# Patient Record
Sex: Male | Born: 1955
Health system: Southern US, Community
[De-identification: ages and names within clinical notes are randomized; demographics above are authoritative.]

## PROBLEM LIST (undated history)

## (undated) DIAGNOSIS — E119 Type 2 diabetes mellitus without complications: Secondary | ICD-10-CM

## (undated) DIAGNOSIS — I509 Heart failure, unspecified: Secondary | ICD-10-CM

## (undated) DIAGNOSIS — T7840XA Allergy, unspecified, initial encounter: Secondary | ICD-10-CM

## (undated) DIAGNOSIS — E785 Hyperlipidemia, unspecified: Secondary | ICD-10-CM

## (undated) DIAGNOSIS — I10 Essential (primary) hypertension: Secondary | ICD-10-CM

## (undated) DIAGNOSIS — G4730 Sleep apnea, unspecified: Secondary | ICD-10-CM

## (undated) DIAGNOSIS — F32A Depression, unspecified: Secondary | ICD-10-CM

## (undated) DIAGNOSIS — I639 Cerebral infarction, unspecified: Secondary | ICD-10-CM

## (undated) DIAGNOSIS — F419 Anxiety disorder, unspecified: Secondary | ICD-10-CM

## (undated) DIAGNOSIS — K219 Gastro-esophageal reflux disease without esophagitis: Secondary | ICD-10-CM

## (undated) DIAGNOSIS — G588 Other specified mononeuropathies: Secondary | ICD-10-CM

## (undated) DIAGNOSIS — M549 Dorsalgia, unspecified: Secondary | ICD-10-CM

## (undated) DIAGNOSIS — I4891 Unspecified atrial fibrillation: Secondary | ICD-10-CM

## (undated) DIAGNOSIS — M1990 Unspecified osteoarthritis, unspecified site: Secondary | ICD-10-CM

## (undated) DIAGNOSIS — G44009 Cluster headache syndrome, unspecified, not intractable: Secondary | ICD-10-CM

## (undated) DIAGNOSIS — Z95810 Presence of automatic (implantable) cardiac defibrillator: Secondary | ICD-10-CM

## (undated) DIAGNOSIS — R0902 Hypoxemia: Secondary | ICD-10-CM

## (undated) HISTORY — DX: Hypoxemia: R09.02

## (undated) HISTORY — PX: CARDIAC DEFIBRILLATOR PLACEMENT: SHX171

## (undated) HISTORY — DX: Anxiety disorder, unspecified: F41.9

## (undated) HISTORY — PX: HYDROCELE EXCISION / REPAIR: SUR1145

## (undated) HISTORY — DX: Heart failure, unspecified: I50.9

## (undated) HISTORY — DX: Gastro-esophageal reflux disease without esophagitis: K21.9

## (undated) HISTORY — DX: Allergy, unspecified, initial encounter: T78.40XA

## (undated) HISTORY — DX: Unspecified atrial fibrillation: I48.91

## (undated) HISTORY — DX: Unspecified osteoarthritis, unspecified site: M19.90

## (undated) HISTORY — PX: KNEE SURGERY: SHX244

## (undated) HISTORY — DX: Sleep apnea, unspecified: G47.30

## (undated) HISTORY — DX: Depression, unspecified: F32.A

## (undated) HISTORY — DX: Cerebral infarction, unspecified: I63.9

## (undated) HISTORY — PX: ROTATOR CUFF REPAIR: SHX139

## (undated) HISTORY — DX: Hyperlipidemia, unspecified: E78.5

---

## 2000-01-19 ENCOUNTER — Emergency Department (HOSPITAL_COMMUNITY): Admission: EM | Admit: 2000-01-19 | Discharge: 2000-01-19 | Payer: Self-pay | Admitting: Emergency Medicine

## 2001-05-01 ENCOUNTER — Inpatient Hospital Stay (HOSPITAL_COMMUNITY): Admission: EM | Admit: 2001-05-01 | Discharge: 2001-05-02 | Payer: Self-pay | Admitting: Emergency Medicine

## 2001-05-01 ENCOUNTER — Encounter: Payer: Self-pay | Admitting: Emergency Medicine

## 2002-02-21 ENCOUNTER — Emergency Department (HOSPITAL_COMMUNITY): Admission: EM | Admit: 2002-02-21 | Discharge: 2002-02-22 | Payer: Self-pay | Admitting: Emergency Medicine

## 2004-11-17 ENCOUNTER — Emergency Department (HOSPITAL_COMMUNITY): Admission: EM | Admit: 2004-11-17 | Discharge: 2004-11-18 | Payer: Self-pay | Admitting: Emergency Medicine

## 2007-12-16 ENCOUNTER — Emergency Department (HOSPITAL_COMMUNITY): Admission: EM | Admit: 2007-12-16 | Discharge: 2007-12-16 | Payer: Self-pay | Admitting: Emergency Medicine

## 2009-02-21 ENCOUNTER — Emergency Department (HOSPITAL_COMMUNITY): Admission: EM | Admit: 2009-02-21 | Discharge: 2009-02-21 | Payer: Self-pay | Admitting: Emergency Medicine

## 2010-08-08 LAB — DIFFERENTIAL
Basophils Absolute: 0 10*3/uL (ref 0.0–0.1)
Basophils Relative: 1 % (ref 0–1)
Eosinophils Relative: 2 % (ref 0–5)
Lymphocytes Relative: 36 % (ref 12–46)
Monocytes Absolute: 0.3 10*3/uL (ref 0.1–1.0)
Monocytes Relative: 8 % (ref 3–12)
Neutro Abs: 2 10*3/uL (ref 1.7–7.7)

## 2010-08-08 LAB — URINALYSIS, ROUTINE W REFLEX MICROSCOPIC
Bilirubin Urine: NEGATIVE
Glucose, UA: NEGATIVE mg/dL
Ketones, ur: NEGATIVE mg/dL
Leukocytes, UA: NEGATIVE
Nitrite: NEGATIVE
Protein, ur: NEGATIVE mg/dL
Specific Gravity, Urine: 1.016 (ref 1.005–1.030)
Urobilinogen, UA: 0.2 mg/dL (ref 0.0–1.0)
pH: 6 (ref 5.0–8.0)

## 2010-08-08 LAB — CBC
HCT: 46.4 % (ref 39.0–52.0)
Hemoglobin: 15.8 g/dL (ref 13.0–17.0)
MCHC: 34.1 g/dL (ref 30.0–36.0)
MCV: 85.2 fL (ref 78.0–100.0)
Platelets: 190 10*3/uL (ref 150–400)
RBC: 5.45 MIL/uL (ref 4.22–5.81)
RDW: 14.2 % (ref 11.5–15.5)
WBC: 3.6 10*3/uL — ABNORMAL LOW (ref 4.0–10.5)

## 2010-08-08 LAB — COMPREHENSIVE METABOLIC PANEL
ALT: 32 U/L (ref 0–53)
AST: 24 U/L (ref 0–37)
Albumin: 3.8 g/dL (ref 3.5–5.2)
Alkaline Phosphatase: 55 U/L (ref 39–117)
BUN: 14 mg/dL (ref 6–23)
CO2: 25 mEq/L (ref 19–32)
Calcium: 9.4 mg/dL (ref 8.4–10.5)
Chloride: 107 mEq/L (ref 96–112)
Creatinine, Ser: 1.3 mg/dL (ref 0.4–1.5)
GFR calc Af Amer: >60 mL/min (ref >60)
GFR calc non Af Amer: 58 mL/min — ABNORMAL LOW (ref >60)
Glucose, Bld: 128 mg/dL — ABNORMAL HIGH (ref 70–99)
Potassium: 4 mEq/L (ref 3.5–5.1)
Sodium: 139 mEq/L (ref 135–145)
Total Bilirubin: 0.2 mg/dL — ABNORMAL LOW (ref 0.3–1.2)
Total Protein: 6.9 g/dL (ref 6.0–8.3)

## 2010-08-08 LAB — URINE MICROSCOPIC-ADD ON

## 2010-08-08 LAB — APTT: aPTT: 26 seconds (ref 24–37)

## 2010-09-20 NOTE — Discharge Summary (Signed)
[place]. [facility]  Patient:    Kristopher Scott, Kristopher Scott Visit Number: [id number] MRN: [id number]          Service Type: MED Location: [id number] Attending Physician:  Darlin Priestly Dictated by:   Halford Decamp Delanna Ahmadi, R.N., N.P. Admit Date:  05/01/2001 Disc. Date: 05/02/01                             Discharge Summary  HISTORY OF PRESENT ILLNESS:  Mr. Kristopher Scott is a 55 year old African-American male patient with a history of borderline hypertension, probably due to the family history of hypertension and stroke.  He came to the emergency room after an episode of substernal chest pain occurring about 7 a.m.  It was described as severe with some discomfort also on his left shoulder.  It was midsternal located, and it was a pressure feeling.  He also had some nausea and diaphoresis.  It lasted about 30 to 45 minutes, resolved spontaneously, not specifically with nitroglycerin.  He also uses a lot of nonsteroidal anti-inflammatory drugs and BC Powders.  He was seen by Dr. Lenise Herald who decided that he should be ruled out for an myocardial infarction.  He also felt that this was somewhat atypical pain because the patient has apparently had this ongoing intermittently for 15 years, however, this mornings episode was somewhat more severe.  PHYSICAL EXAMINATION:  VITAL SIGNS:  Blood pressure 127/97, temperature 97.8, heart rate 94, respiratory rate 20.  LABORATORY DATA:  His EKG showed sinus bradycardia, rate of 57.  HOSPITAL COURSE:  On the morning of 05/02/01, he stated he had no further midsternal discomfort.  He did have an episode of right-sided chest discomfort, but no further midsternal pain.  His blood pressure was 132/85, pulse was 54, respiratory rate was 22, temperature was 97.4.  His CK-MBs were negative x 3.  First CK-MB was 354/2.9, #2 was 237/1.7, #3 was 182/1.6.  His troponins were 0.04, 0.03, and 0.03.  His chest x-ray did show  some cardiomegaly, some probably pulmonary vascular congestion, and parabronchial thickening, no interstitial edema.  He was seen by Dr. Elsie Lincoln who felt also that he could be discharged home.  His repeat EKG showed sinus bradycardia. It was decided that he should be sent home on aspirin, metoprolol, and Protonix.  At the time of discharge, the patient told me that he has had reactions to propranolol with weight loss and hallucinations.  It was felt that he should have an outpatient Cardiolite.  He will either have this done at the [place] system or in our office.  Also to note, he has a fasting lipid profile pending at the time of discharge.  His hemoglobin was 14.2, hematocrit 42.6, platelets 273.  White blood cell count 4.0.  His sodium was 138, potassium 3.6, BUN 12, creatinine 1.3, glucose 107.  DISCHARGE MEDICATIONS: 1. Toprol XL 25 mg q.d. 2. Enteric-coated aspirin 81 mg q.d. 3. Nexium 40 mg q.d.  ACTIVITY:  He will do no strenuous activity.  DIET:  Regular.  Do not add any salt to his foods.  FOLLOWUP:  Directions to our office were given.  He will call for sample medications of Toprol and Nexium in the morning.  Our office will call him with an appointment to have a treadmill Cardiolite.  [facility]  He may have his treadmill done with them.  DISCHARGE DIAGNOSES:  1. Chest pain, no myocardial infarction with negative CK-MBs. 2. Borderline hypertension. 3. Unknown lipid status with fasting lipid profile pending at the time of    discharge. 4. Frequent nonsteroidal anti-inflammatory drugs and BC Powder use with a    history of occasional reflux. Dictated by:   Halford Decamp Delanna Ahmadi, R.N., N.P. Attending Physician:  Darlin Priestly DD:  05/02/01 TD:  05/02/01 Job: [id number]

## 2011-01-31 LAB — DIFFERENTIAL
Eosinophils Relative: 3
Lymphs Abs: 1.6
Monocytes Absolute: 0.4
Monocytes Relative: 9
Neutro Abs: 2

## 2011-01-31 LAB — POCT I-STAT, CHEM 8
BUN: 10
Calcium, Ion: 1.22
Chloride: 103
Creatinine, Ser: 1.4
Glucose, Bld: 110 — ABNORMAL HIGH
TCO2: 29

## 2011-01-31 LAB — POCT CARDIAC MARKERS
CKMB, poc: <1 — ABNORMAL LOW
Myoglobin, poc: 72.2
Myoglobin, poc: 74.7
Troponin i, poc: <0.05

## 2011-01-31 LAB — CBC
HCT: 47.6
Hemoglobin: 16
MCV: 84.8
Platelets: 245
WBC: 4.1

## 2011-08-04 ENCOUNTER — Emergency Department (HOSPITAL_COMMUNITY): Payer: No Typology Code available for payment source

## 2011-08-04 ENCOUNTER — Encounter (HOSPITAL_COMMUNITY): Payer: Self-pay | Admitting: Emergency Medicine

## 2011-08-04 ENCOUNTER — Emergency Department (HOSPITAL_COMMUNITY)
Admission: EM | Admit: 2011-08-04 | Discharge: 2011-08-04 | Disposition: A | Discharge status: Home / Self Care | Payer: No Typology Code available for payment source | Attending: Emergency Medicine | Admitting: Emergency Medicine

## 2011-08-04 DIAGNOSIS — M545 Low back pain, unspecified: Secondary | ICD-10-CM | POA: Insufficient documentation

## 2011-08-04 DIAGNOSIS — Z79899 Other long term (current) drug therapy: Secondary | ICD-10-CM | POA: Insufficient documentation

## 2011-08-04 DIAGNOSIS — I10 Essential (primary) hypertension: Secondary | ICD-10-CM | POA: Insufficient documentation

## 2011-08-04 DIAGNOSIS — Y92410 Unspecified street and highway as the place of occurrence of the external cause: Secondary | ICD-10-CM | POA: Insufficient documentation

## 2011-08-04 DIAGNOSIS — M25519 Pain in unspecified shoulder: Secondary | ICD-10-CM | POA: Insufficient documentation

## 2011-08-04 DIAGNOSIS — M25579 Pain in unspecified ankle and joints of unspecified foot: Secondary | ICD-10-CM | POA: Insufficient documentation

## 2011-08-04 DIAGNOSIS — M542 Cervicalgia: Secondary | ICD-10-CM | POA: Insufficient documentation

## 2011-08-04 DIAGNOSIS — T148XXA Other injury of unspecified body region, initial encounter: Secondary | ICD-10-CM

## 2011-08-04 HISTORY — DX: Dorsalgia, unspecified: M54.9

## 2011-08-04 HISTORY — DX: Essential (primary) hypertension: I10

## 2011-08-04 MED ORDER — OXYCODONE-ACETAMINOPHEN 5-325 MG PO TABS
2.0000 | ORAL_TABLET | Freq: Once | ORAL | Status: AC
  Administered 2011-08-04: 2 via ORAL
  Filled 2011-08-04: qty 2
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
## 2011-10-28 ENCOUNTER — Emergency Department (HOSPITAL_COMMUNITY)

## 2011-10-28 ENCOUNTER — Encounter (HOSPITAL_COMMUNITY): Payer: Self-pay | Admitting: *Deleted

## 2011-10-28 ENCOUNTER — Emergency Department (HOSPITAL_COMMUNITY)
Admission: EM | Admit: 2011-10-28 | Discharge: 2011-10-28 | Disposition: A | Discharge status: Home / Self Care | Payer: No Typology Code available for payment source | Attending: Emergency Medicine | Admitting: Emergency Medicine

## 2011-10-28 DIAGNOSIS — R110 Nausea: Secondary | ICD-10-CM | POA: Insufficient documentation

## 2011-10-28 DIAGNOSIS — R51 Headache: Secondary | ICD-10-CM | POA: Insufficient documentation

## 2011-10-28 DIAGNOSIS — H53149 Visual discomfort, unspecified: Secondary | ICD-10-CM | POA: Insufficient documentation

## 2011-10-28 DIAGNOSIS — I10 Essential (primary) hypertension: Secondary | ICD-10-CM | POA: Insufficient documentation

## 2011-10-28 LAB — GLUCOSE, CAPILLARY: Glucose-Capillary: 109 mg/dL — ABNORMAL HIGH (ref 70–99)

## 2011-10-28 MED ORDER — HYDROMORPHONE HCL PF 1 MG/ML IJ SOLN
1.0000 mg | Freq: Once | INTRAMUSCULAR | Status: AC
  Administered 2011-10-28: 1 mg via INTRAVENOUS
  Filled 2011-10-28: qty 1

## 2011-10-28 MED ORDER — METOCLOPRAMIDE HCL 5 MG/ML IJ SOLN
10.0000 mg | Freq: Once | INTRAMUSCULAR | Status: AC
  Administered 2011-10-28: 10 mg via INTRAVENOUS
  Filled 2011-10-28: qty 2

## 2011-10-28 MED ORDER — METOCLOPRAMIDE HCL 5 MG/ML IJ SOLN: 10.0000 mg | Freq: Once | INTRAMUSCULAR | Status: DC

## 2011-10-28 MED ORDER — SODIUM CHLORIDE 0.9 % IV SOLN
Freq: Once | INTRAVENOUS | Status: AC
  Administered 2011-10-28: 20:00:00 via INTRAVENOUS

## 2011-10-28 NOTE — Discharge Instructions (Signed)
Your symptoms today are suggestive of a cluster headache. You have your head CT scan tonight was normal. Call the [facility] clinic for followup. If headaches continue, ask to see a headache specialist

## 2011-10-28 NOTE — ED Notes (Signed)
Pt brought in by private vehicle; diaphoretic/lethargic; assisted to bed; pt states had severe sharp pain to head this morning radiating to jaw that went away; states tonight about an hour prior to arrival had same severe head pain at right temple making him want to cry; felt weak and short of breath;

## 2011-10-28 NOTE — ED Provider Notes (Signed)
History     CSN: [id number]  Arrival date & time 10/28/11  2006   First MD Initiated Contact with Patient 10/28/11 2012      No chief complaint on file.   (Consider location/radiation/quality/duration/timing/severity/associated sxs/prior treatment) HPI Complains of right-sided temporal headache radiating to right jaw throbbing in nature gradual in onset approximately 90 minutes ago. Patient had similar headache this morning which lasted 2 hours resolved after treatment with BC powder. Accompanying symptoms include nausea and photophobia no other complaint no chest pain no shortness of breath no other complaint no weakness. Pain is constant not made better or worse by anything. Patient reports he gets headaches almost daily, however not as severe as this. Patient denies shortness of breath Past Medical History  Diagnosis Date  . Hypertension   . Back pain     No past surgical history on file.  No family history on file.  History  Substance Use Topics  . Smoking status: Not on file  . Smokeless tobacco: Not on file  . Alcohol Use:       Review of Systems  Constitutional: Negative.   Eyes: Positive for photophobia.  Respiratory: Negative.   Cardiovascular: Negative.   Gastrointestinal: Positive for nausea.  Musculoskeletal: Negative.   Skin: Negative.   Neurological: Positive for headaches.  Hematological: Negative.   Psychiatric/Behavioral: Negative.     Allergies  Propranolol  Home Medications   Current Outpatient Rx  Name Route Sig Dispense Refill  . ALLOPURINOL 100 MG PO TABS Oral Take 100 mg by mouth daily.    Marland Kitchen AMLODIPINE BESYLATE 10 MG PO TABS Oral Take 10 mg by mouth daily.    . CYCLOBENZAPRINE HCL 10 MG PO TABS Oral Take 10 mg by mouth 3 (three) times daily as needed.    Marland Kitchen DICLOFENAC SODIUM 75 MG PO TBEC Oral Take 75 mg by mouth 2 (two) times daily.    Marland Kitchen DICYCLOMINE HCL 10 MG PO CAPS Oral Take 10 mg by mouth 4 (four) times daily -  before meals and at  bedtime.    . OXYCODONE HCL 5 MG PO CAPS Oral Take 5-10 mg by mouth every 4 (four) hours as needed. pain    . SERTRALINE HCL 100 MG PO TABS Oral Take 100 mg by mouth daily.    . TRAZODONE HCL 100 MG PO TABS Oral Take 100 mg by mouth at bedtime.      There were no vitals taken for this visit.  Physical Exam  Nursing note and vitals reviewed. Constitutional: He appears well-developed and well-nourished. He appears distressed.       Appears uncomfortable Glasgow Coma Score 15  HENT:  Head: Normocephalic and atraumatic.  Eyes: Conjunctivae are normal. Pupils are equal, round, and reactive to light.       Photophobia  Neck: Neck supple. No tracheal deviation present. No thyromegaly present.  Cardiovascular: Normal rate and regular rhythm.   No murmur heard. Pulmonary/Chest: Effort normal and breath sounds normal.  Abdominal: Soft. Bowel sounds are normal. He exhibits no distension. There is no tenderness.  Musculoskeletal: Normal range of motion. He exhibits no edema and no tenderness.  Neurological: He is alert. No cranial nerve deficit. Coordination normal.  Skin: Skin is warm and dry. No rash noted.  Psychiatric: He has a normal mood and affect.    ED Course  Procedures (including critical care time) 9:20 PM pain significantly improved after treatment with intravenous Reglan and 100% oxygen patient alert Glasgow Coma Score 15 1045  feels much improved after treatment with intravenous narcotic patient alert ambulates without difficulty not lightheaded on standing and feels ready to go home Labs Reviewed - No data to display No results found.   No diagnosis found.  Date: 10/28/2011  Rate: 75  Rhythm: normal sinus rhythm  QRS Axis: left  Intervals: normal  ST/T Wave abnormalities: normal  Conduction Disutrbances:none  Narrative Interpretation:   Old EKG Reviewed: unchanged Unchanged from 02/21/2009, interpreted by me  Results for orders placed during the hospital encounter of  10/28/11  GLUCOSE, CAPILLARY      Component Value Range   Glucose-Capillary 109 (*) 70 - 99 mg/dL   Ct Head Wo Contrast  10/28/2011  *RADIOLOGY REPORT*  Clinical Data: 56 year old male with headache.  CT HEAD WITHOUT CONTRAST  Technique:  Contiguous axial images were obtained from the base of the skull through the vertex without contrast.  Comparison: 12/16/2007.  Findings: Negative paranasal sinuses, unchanged small right maxillary mucous retention cyst. Visualized orbit soft tissues are within normal limits.  Visualized scalp soft tissues are within normal limits.  No acute osseous abnormality identified.  Cerebral volume is within normal limits for age.  No midline shift, ventriculomegaly, mass effect, evidence of mass lesion, intracranial hemorrhage or evidence of cortically based acute infarction.  Gray-white matter differentiation is within normal limits throughout the brain.  No suspicious intracranial vascular hyperdensity.  IMPRESSION: Stable and normal noncontrast CT appearance of the brain.  Original Report Authenticated By: Harley Hallmark, M.D.     MDM  Constellation of Symptoms are suggestive of cluster headache . srongly doubt sah given pt has h/o throbbing h/a , gradual onset [facility]        Doug Sou, MD 10/28/11 2259

## 2012-01-23 DIAGNOSIS — M75100 Unspecified rotator cuff tear or rupture of unspecified shoulder, not specified as traumatic: Secondary | ICD-10-CM | POA: Insufficient documentation

## 2012-04-04 HISTORY — PX: ROTATOR CUFF REPAIR: SHX139

## 2013-01-25 ENCOUNTER — Emergency Department (HOSPITAL_COMMUNITY): Payer: Non-veteran care

## 2013-01-25 ENCOUNTER — Emergency Department (HOSPITAL_COMMUNITY)
Admission: EM | Admit: 2013-01-25 | Discharge: 2013-01-26 | Disposition: A | Discharge status: Home / Self Care | Payer: Non-veteran care | Attending: Emergency Medicine | Admitting: Emergency Medicine

## 2013-01-25 ENCOUNTER — Encounter (HOSPITAL_COMMUNITY): Payer: Self-pay | Admitting: Emergency Medicine

## 2013-01-25 DIAGNOSIS — G43909 Migraine, unspecified, not intractable, without status migrainosus: Secondary | ICD-10-CM | POA: Insufficient documentation

## 2013-01-25 DIAGNOSIS — M542 Cervicalgia: Secondary | ICD-10-CM | POA: Insufficient documentation

## 2013-01-25 DIAGNOSIS — I10 Essential (primary) hypertension: Secondary | ICD-10-CM | POA: Insufficient documentation

## 2013-01-25 DIAGNOSIS — H53149 Visual discomfort, unspecified: Secondary | ICD-10-CM | POA: Insufficient documentation

## 2013-01-25 DIAGNOSIS — Z7982 Long term (current) use of aspirin: Secondary | ICD-10-CM | POA: Insufficient documentation

## 2013-01-25 DIAGNOSIS — G8929 Other chronic pain: Secondary | ICD-10-CM | POA: Insufficient documentation

## 2013-01-25 DIAGNOSIS — R110 Nausea: Secondary | ICD-10-CM | POA: Insufficient documentation

## 2013-01-25 DIAGNOSIS — Z79899 Other long term (current) drug therapy: Secondary | ICD-10-CM | POA: Insufficient documentation

## 2013-01-25 HISTORY — DX: Cluster headache syndrome, unspecified, not intractable: G44.009

## 2013-01-25 MED ORDER — ONDANSETRON 8 MG PO TBDP
8.0000 mg | ORAL_TABLET | Freq: Once | ORAL | Status: AC
  Administered 2013-01-25: 8 mg via ORAL
  Filled 2013-01-25: qty 1

## 2013-01-25 MED ORDER — METOCLOPRAMIDE HCL 5 MG/ML IJ SOLN
10.0000 mg | Freq: Once | INTRAMUSCULAR | Status: AC
  Administered 2013-01-25: 10 mg via INTRAMUSCULAR
  Filled 2013-01-25: qty 2

## 2013-01-25 MED ORDER — DIPHENHYDRAMINE HCL 50 MG/ML IJ SOLN
25.0000 mg | Freq: Once | INTRAMUSCULAR | Status: AC
  Administered 2013-01-25: 25 mg via INTRAMUSCULAR
  Filled 2013-01-25: qty 1

## 2013-01-25 MED ORDER — SUMATRIPTAN SUCCINATE 6 MG/0.5ML ~~LOC~~ SOLN
6.0000 mg | Freq: Once | SUBCUTANEOUS | Status: AC
  Administered 2013-01-25: 6 mg via SUBCUTANEOUS
  Filled 2013-01-25: qty 0.5

## 2013-01-25 NOTE — ED Notes (Addendum)
Pt reports a HA since 1230 with R sided neck pain n/v, ShOB, weakness, lightheaded, sensitivity to light.  Pt reports cluster HAs in the past, but this is worse than ever before with associated s/s.

## 2013-01-25 NOTE — ED Provider Notes (Signed)
CSN: [id number]     Arrival date & time 01/25/13  1857 History   First MD Initiated Contact with Patient 01/25/13 2047     Chief Complaint  Patient presents with  . Headache  . Neck Pain   (Consider location/radiation/quality/duration/timing/severity/associated sxs/prior Treatment) HPI  Kristopher Scott is a 57 y.o. male  with a hx of HTN, cluster headache, chronic back pain presents to the Emergency Department complaining of gradual, persistent, progressively worsening headache onset yesterday evening.  Patient reports that yesterday evening he lives, throbbing headache that resolved with Goody headache powder. He reports the headache returned today at 2 PM and he felt drowsy with it. He states he laid down and slept until 5 PM when he awoke and felt as if his "head exploded."  He reports he had an immediate and sharp increase in his pain beginning in the frontal region that radiated around the back of his head and down into his neck.  Patient reports right-sided neck pain but no midline or left-sided neck pain. He denies back pain, fever, syncope or visual disturbances.  Associated symptoms include photophobia, and nausea without vomiting.  Patient attempted to take Goody's headache powder again this evening without relief. Nothing makes it better and light and sound makes it worse.  Pt denies fever, chills, chest pain, shortness of breath, abdominal pain, vomiting, diarrhea, weakness, dizziness, syncope, dysuria, hematuria.  Patient reports that last year he had a very similar headache and was diagnosed with a "cluster headache" but he's not had any since that time. He denies lacrimation, rhinorrhea, facial numbness or diaphoresis.   Past Medical History  Diagnosis Date  . Hypertension   . Back pain   . Cluster headaches    Past Surgical History  Procedure Laterality Date  . Rotator cuff repair Right 04/2012   History reviewed. No pertinent family history. History  Substance Use Topics   . Smoking status: Never Smoker   . Smokeless tobacco: Never Used  . Alcohol Use: Yes     Comment: socially    Review of Systems  Constitutional: Negative for fever, diaphoresis, appetite change, fatigue and unexpected weight change.  HENT: Positive for neck pain. Negative for mouth sores and neck stiffness.   Eyes: Positive for photophobia. Negative for visual disturbance.  Respiratory: Negative for cough, chest tightness, shortness of breath and wheezing.   Cardiovascular: Negative for chest pain.  Gastrointestinal: Negative for nausea, vomiting, abdominal pain, diarrhea and constipation.  Endocrine: Negative for polydipsia, polyphagia and polyuria.  Genitourinary: Negative for dysuria, urgency, frequency and hematuria.  Musculoskeletal: Negative for back pain.  Skin: Negative for rash.  Allergic/Immunologic: Negative for immunocompromised state.  Neurological: Positive for headaches. Negative for syncope and light-headedness.  Hematological: Does not bruise/bleed easily.  Psychiatric/Behavioral: Negative for sleep disturbance. The patient is not nervous/anxious.     Allergies  Propranolol  Home Medications   Current Outpatient Rx  Name  Route  Sig  Dispense  Refill  . allopurinol (ZYLOPRIM) 100 MG tablet   Oral   Take 100 mg by mouth daily.         Marland Kitchen amLODipine (NORVASC) 10 MG tablet   Oral   Take 10 mg by mouth daily.         . Aspirin-Acetaminophen-Caffeine (GOODY HEADACHE PO)   Oral   Take 1 packet by mouth daily as needed (for pain).         . cyclobenzaprine (FLEXERIL) 10 MG tablet   Oral   Take 10  mg by mouth 3 (three) times daily as needed for muscle spasms.          . diclofenac (VOLTAREN) 75 MG EC tablet   Oral   Take 75 mg by mouth 2 (two) times daily.         Marland Kitchen oxyCODONE (OXY IR/ROXICODONE) 5 MG immediate release tablet   Oral   Take 10 mg by mouth every 4 (four) hours as needed for pain.         Marland Kitchen sertraline (ZOLOFT) 100 MG tablet    Oral   Take 100 mg by mouth daily.         . traZODone (DESYREL) 100 MG tablet   Oral   Take 50 mg by mouth at bedtime.           BP 148/94  Pulse 84  Temp(Src) 99 F (37.2 C) (Oral)  Resp 18  Ht 6\' 1"  (1.854 m)  Wt 250 lb (113.399 kg)  BMI 32.99 kg/m2  SpO2 97% Physical Exam  Nursing note and vitals reviewed. Constitutional: He is oriented to person, place, and time. He appears well-developed and well-nourished. No distress.  HENT:  Head: Normocephalic and atraumatic.  Right Ear: Hearing, tympanic membrane, external ear and ear canal normal.  Left Ear: Hearing, tympanic membrane, external ear and ear canal normal.  Nose: Nose normal. No mucosal edema or rhinorrhea.  Mouth/Throat: Uvula is midline, oropharynx is clear and moist and mucous membranes are normal. Mucous membranes are not dry. No edematous. No oropharyngeal exudate, posterior oropharyngeal edema, posterior oropharyngeal erythema or tonsillar abscesses.  No tenderness palpation of the temporal artery  Eyes: Conjunctivae and EOM are normal. Pupils are equal, round, and reactive to light. No scleral icterus.  Neck: Normal range of motion and full passive range of motion without pain. Neck supple. No spinous process tenderness and no muscular tenderness present. No rigidity. Normal range of motion present. No Brudzinski's sign and no Kernig's sign noted.  Full range of motion without pain No nuchal rigidity or stiffness  Cardiovascular: Normal rate, regular rhythm, normal heart sounds and intact distal pulses.   No murmur heard. Pulmonary/Chest: Effort normal and breath sounds normal. No respiratory distress. He has no wheezes. He has no rales.  Abdominal: Soft. Bowel sounds are normal. He exhibits no distension. There is no tenderness. There is no rebound and no guarding.  Musculoskeletal: Normal range of motion.  Lymphadenopathy:    He has no cervical adenopathy.  Neurological: He is alert and oriented to person,  place, and time. He has normal reflexes. No cranial nerve deficit. He exhibits normal muscle tone. Coordination normal.  Speech is clear and goal oriented, follows commands Cranial nerves III - XII without deficit, no facial droop Normal strength in upper and lower extremities bilaterally, strong and equal grip strength Sensation normal to light and sharp touch Moves extremities without ataxia, coordination intact Normal finger to nose and rapid alternating movements Neg romberg, no pronator drift Normal gait Normal heel-shin and balance   Skin: Skin is warm and dry. No rash noted. He is not diaphoretic.  Psychiatric: He has a normal mood and affect. His behavior is normal. Judgment and thought content normal.    ED Course  Procedures (including critical care time) Labs Review Labs Reviewed - No data to display Imaging Review Ct Head Wo Contrast  01/25/2013   CLINICAL DATA:  Headache.  EXAM: CT HEAD WITHOUT CONTRAST  TECHNIQUE: Contiguous axial images were obtained from the base of the  skull through the vertex without intravenous contrast.  COMPARISON:  Head CT scan C 10/28/2011.  FINDINGS: The brain appears normal without infarct, hemorrhage, mass lesion, mass effect, midline shift or abnormal extra-axial fluid collection. Empty sella is noted. Small mucous retention cyst or polyp is seen the right maxillary sinus. No pneumocephalus or hydrocephalus.  IMPRESSION: No acute finding.   Electronically Signed   By: Drusilla Kanner M.D.   On: 01/25/2013 22:03   ECG:  Date: 01/26/2013  Rate: 80  Rhythm: normal sinus rhythm  QRS Axis: left  Intervals: normal  ST/T Wave abnormalities: normal  Conduction Disutrbances:none  Narrative Interpretation:   Old EKG Reviewed: unchanged    MDM   1. Migraine headache      DENIS CARREON presents with headache, similar to his last headache that with increased intensity.  Patient history concerning for possibility of subarachnoid therefore  will CT scan.  Patient is afebrile, non-tachycardic with no neurologic deficits on exam. Nursing note review and reports shortness of breath, weakness along, lightheadedness as well as nausea and vomiting, however the patient denies all but nausea.  CT without acute abnormality specifically no infarct hemorrhage or mass lesion.  Will give sumatriptan, Reglan and Benadryl.  12:07 AM Patient with complete resolution of headache, photophobia and nausea. He states he feels back to normal and like to be discharged.   Pt HA treated and improved while in ED.  Presentation is non-concerning for Meningitis, or temporal arteritis. Pt is afebrile with no focal neuro deficits, nuchal rigidity, or change in vision. Pt is to follow up with PCP to discuss prophylactic medication. Pt verbalizes understanding and is agreeable with plan to dc.    It has been determined that no acute conditions requiring further emergency intervention are present at this time. The patient/guardian have been advised of the diagnosis and plan. We have discussed signs and symptoms that warrant return to the ED, such as changes or worsening in symptoms.   Vital signs are stable at discharge.   BP 148/94  Pulse 84  Temp(Src) 99 F (37.2 C) (Oral)  Resp 18  Ht 6\' 1"  (1.854 m)  Wt 250 lb (113.399 kg)  BMI 32.99 kg/m2  SpO2 97%  Patient/guardian has voiced understanding and agreed to follow-up with the PCP or specialist.         MDM Number of Diagnoses or Management Options Migraine headache:       Dierdre Forth, PA-C 01/26/13 0009

## 2013-01-26 NOTE — ED Provider Notes (Signed)
Medical screening examination/treatment/procedure(s) were performed by non-physician practitioner and as supervising physician I was immediately available for consultation/collaboration.  Ladell Bey F Toshio Slusher, MD 01/26/13 1418 

## 2014-05-05 ENCOUNTER — Emergency Department (HOSPITAL_COMMUNITY)
Admission: EM | Admit: 2014-05-05 | Discharge: 2014-05-05 | Disposition: A | Discharge status: Home / Self Care | Payer: Non-veteran care | Attending: Emergency Medicine | Admitting: Emergency Medicine

## 2014-05-05 ENCOUNTER — Encounter (HOSPITAL_COMMUNITY): Payer: Self-pay | Admitting: Emergency Medicine

## 2014-05-05 ENCOUNTER — Emergency Department (HOSPITAL_COMMUNITY): Payer: Non-veteran care

## 2014-05-05 DIAGNOSIS — R05 Cough: Secondary | ICD-10-CM

## 2014-05-05 DIAGNOSIS — I10 Essential (primary) hypertension: Secondary | ICD-10-CM | POA: Insufficient documentation

## 2014-05-05 DIAGNOSIS — R000 Tachycardia, unspecified: Secondary | ICD-10-CM | POA: Diagnosis not present

## 2014-05-05 DIAGNOSIS — R2243 Localized swelling, mass and lump, lower limb, bilateral: Secondary | ICD-10-CM | POA: Insufficient documentation

## 2014-05-05 DIAGNOSIS — R059 Cough, unspecified: Secondary | ICD-10-CM

## 2014-05-05 DIAGNOSIS — I509 Heart failure, unspecified: Secondary | ICD-10-CM | POA: Diagnosis not present

## 2014-05-05 DIAGNOSIS — Z7982 Long term (current) use of aspirin: Secondary | ICD-10-CM | POA: Insufficient documentation

## 2014-05-05 DIAGNOSIS — R079 Chest pain, unspecified: Secondary | ICD-10-CM | POA: Diagnosis present

## 2014-05-05 DIAGNOSIS — Z79899 Other long term (current) drug therapy: Secondary | ICD-10-CM | POA: Diagnosis not present

## 2014-05-05 DIAGNOSIS — Z8669 Personal history of other diseases of the nervous system and sense organs: Secondary | ICD-10-CM | POA: Insufficient documentation

## 2014-05-05 LAB — CBC WITH DIFFERENTIAL/PLATELET
Basophils Absolute: 0 10*3/uL (ref 0.0–0.1)
Basophils Relative: 0 % (ref 0–1)
Eosinophils Absolute: 0.1 10*3/uL (ref 0.0–0.7)
Eosinophils Relative: 2 % (ref 0–5)
HCT: 42.8 % (ref 39.0–52.0)
Hemoglobin: 14 g/dL (ref 13.0–17.0)
Lymphocytes Relative: 37 % (ref 12–46)
Lymphs Abs: 1.7 10*3/uL (ref 0.7–4.0)
MCH: 28.5 pg (ref 26.0–34.0)
MCHC: 32.7 g/dL (ref 30.0–36.0)
MCV: 87 fL (ref 78.0–100.0)
Monocytes Absolute: 0.2 10*3/uL (ref 0.1–1.0)
Monocytes Relative: 4 % (ref 3–12)
Neutro Abs: 2.6 10*3/uL (ref 1.7–7.7)
Neutrophils Relative %: 57 % (ref 43–77)
Platelets: 202 10*3/uL (ref 150–400)
RBC: 4.92 MIL/uL (ref 4.22–5.81)
RDW: 14.9 % (ref 11.5–15.5)
WBC: 4.6 10*3/uL (ref 4.0–10.5)

## 2014-05-05 LAB — BASIC METABOLIC PANEL
Anion gap: 8 (ref 5–15)
BUN: 10 mg/dL (ref 6–23)
CO2: 28 mmol/L (ref 19–32)
Calcium: 8.8 mg/dL (ref 8.4–10.5)
Chloride: 104 mEq/L (ref 96–112)
Creatinine, Ser: 1.29 mg/dL (ref 0.50–1.35)
GFR calc Af Amer: 70 mL/min — ABNORMAL LOW (ref >90)
GFR calc non Af Amer: 60 mL/min — ABNORMAL LOW (ref >90)
Glucose, Bld: 113 mg/dL — ABNORMAL HIGH (ref 70–99)
Potassium: 3.5 mmol/L (ref 3.5–5.1)
Sodium: 140 mmol/L (ref 135–145)

## 2014-05-05 LAB — BRAIN NATRIURETIC PEPTIDE: B Natriuretic Peptide: 517.6 pg/mL — ABNORMAL HIGH (ref 0.0–100.0)

## 2014-05-05 LAB — I-STAT TROPONIN, ED: Troponin i, poc: 0.03 ng/mL (ref 0.00–0.08)

## 2014-05-05 MED ORDER — HYDROMORPHONE HCL 1 MG/ML IJ SOLN
1.0000 mg | Freq: Once | INTRAMUSCULAR | Status: AC
  Administered 2014-05-05: 1 mg via INTRAVENOUS
  Filled 2014-05-05: qty 1

## 2014-05-05 MED ORDER — FUROSEMIDE 10 MG/ML IJ SOLN
40.0000 mg | INTRAMUSCULAR | Status: AC
  Administered 2014-05-05: 40 mg via INTRAVENOUS
  Filled 2014-05-05: qty 4

## 2014-05-05 MED ORDER — FUROSEMIDE 40 MG PO TABS: 40.0000 mg | ORAL_TABLET | Freq: Every day | ORAL | Status: DC

## 2014-05-05 NOTE — Discharge Instructions (Signed)
Please follow up with your primary care physician in 1-2 days. If you do not have one please call the [facility] number listed above. Please follow up with your cardiologist to schedule a follow up appointment.  Please take 40mg  of Lasix daily instead of 20mg . Please read all discharge instructions and return precautions.   Heart Failure Heart failure is a condition in which the heart has trouble pumping blood. This means your heart does not pump blood efficiently for your body to work well. In some cases of heart failure, fluid may back up into your lungs or you may have swelling (edema) in your lower legs. Heart failure is usually a long-term (chronic) condition. It is important for you to take good care of yourself and follow your health care provider's treatment plan. CAUSES  Some health conditions can cause heart failure. Those health conditions include:  High blood pressure (hypertension). Hypertension causes the heart muscle to work harder than normal. When pressure in the blood vessels is high, the heart needs to pump (contract) with more force in order to circulate blood throughout the body. High blood pressure eventually causes the heart to become stiff and weak.  Coronary artery disease (CAD). CAD is the buildup of cholesterol and fat (plaque) in the arteries of the heart. The blockage in the arteries deprives the heart muscle of oxygen and blood. This can cause chest pain and may lead to a heart attack. High blood pressure can also contribute to CAD.  Heart attack (myocardial infarction). A heart attack occurs when one or more arteries in the heart become blocked. The loss of oxygen damages the muscle tissue of the heart. When this happens, part of the heart muscle dies. The injured tissue does not contract as well and weakens the heart's ability to pump blood.  Abnormal heart valves. When the heart valves do not open and close properly, it can cause heart failure. This  makes the heart muscle pump harder to keep the blood flowing.  Heart muscle disease (cardiomyopathy or myocarditis). Heart muscle disease is damage to the heart muscle from a variety of causes. These can include drug or alcohol abuse, infections, or unknown reasons. These can increase the risk of heart failure.  Lung disease. Lung disease makes the heart work harder because the lungs do not work properly. This can cause a strain on the heart, leading it to fail.  Diabetes. Diabetes increases the risk of heart failure. High blood sugar contributes to high fat (lipid) levels in the blood. Diabetes can also cause slow damage to tiny blood vessels that carry important nutrients to the heart muscle. When the heart does not get enough oxygen and food, it can cause the heart to become weak and stiff. This leads to a heart that does not contract efficiently.  Other conditions can contribute to heart failure. These include abnormal heart rhythms, thyroid problems, and low blood counts (anemia). Certain unhealthy behaviors can increase the risk of heart failure, including:  Being overweight.  Smoking or chewing tobacco.  Eating foods high in fat and cholesterol.  Abusing illicit drugs or alcohol.  Lacking physical activity. SYMPTOMS  Heart failure symptoms may vary and can be hard to detect. Symptoms may include:  Shortness of breath with activity, such as climbing stairs.  Persistent cough.  Swelling of the feet, ankles, legs, or abdomen.  Unexplained weight gain.  Difficulty breathing when lying flat (orthopnea).  Waking from sleep because of the need to sit up  and get more air.  Rapid heartbeat.  Fatigue and loss of energy.  Feeling light-headed, dizzy, or close to fainting.  Loss of appetite.  Nausea.  Increased urination during the night (nocturia). DIAGNOSIS  A diagnosis of heart failure is based on your history, symptoms, physical examination, and diagnostic tests.  Diagnostic tests for heart failure may include:  Echocardiography.  Electrocardiography.  Chest X-ray.  Blood tests.  Exercise stress test.  Cardiac angiography.  Radionuclide scans. TREATMENT  Treatment is aimed at managing the symptoms of heart failure. Medicines, behavioral changes, or surgical intervention may be necessary to treat heart failure.  Medicines to help treat heart failure may include:  Angiotensin-converting enzyme (ACE) inhibitors. This type of medicine blocks the effects of a blood protein called angiotensin-converting enzyme. ACE inhibitors relax (dilate) the blood vessels and help lower blood pressure.  Angiotensin receptor blockers (ARBs). This type of medicine blocks the actions of a blood protein called angiotensin. Angiotensin receptor blockers dilate the blood vessels and help lower blood pressure.  Water pills (diuretics). Diuretics cause the kidneys to remove salt and water from the blood. The extra fluid is removed through urination. This loss of extra fluid lowers the volume of blood the heart pumps.  Beta blockers. These prevent the heart from beating too fast and improve heart muscle strength.  Digitalis. This increases the force of the heartbeat.  Healthy behavior changes include:  Obtaining and maintaining a healthy weight.  Stopping smoking or chewing tobacco.  Eating heart-healthy foods.  Limiting or avoiding alcohol.  Stopping illicit drug use.  Physical activity as directed by your health care provider.  Surgical treatment for heart failure may include:  A procedure to open blocked arteries, repair damaged heart valves, or remove damaged heart muscle tissue.  A pacemaker to improve heart muscle function and control certain abnormal heart rhythms.  An internal cardioverter defibrillator to treat certain serious abnormal heart rhythms.  A left ventricular assist device (LVAD) to assist the pumping ability of the heart. HOME CARE  INSTRUCTIONS   Take medicines only as directed by your health care provider. Medicines are important in reducing the workload of your heart, slowing the progression of heart failure, and improving your symptoms.  Do not stop taking your medicine unless directed by your health care provider.  Do not skip any dose of medicine.  Refill your prescriptions before you run out of medicine. Your medicines are needed every day.  Engage in moderate physical activity if directed by your health care provider. Moderate physical activity can benefit some people. The elderly and people with severe heart failure should consult with a health care provider for physical activity recommendations.  Eat heart-healthy foods. Food choices should be free of trans fat and low in saturated fat, cholesterol, and salt (sodium). Healthy choices include fresh or frozen fruits and vegetables, fish, lean meats, legumes, fat-free or low-fat dairy products, and whole grain or high fiber foods. Talk to a dietitian to learn more about heart-healthy foods.  Limit sodium if directed by your health care provider. Sodium restriction may reduce symptoms of heart failure in some people. Talk to a dietitian to learn more about heart-healthy seasonings.  Use healthy cooking methods. Healthy cooking methods include roasting, grilling, broiling, baking, poaching, steaming, or stir-frying. Talk to a dietitian to learn more about healthy cooking methods.  Limit fluids if directed by your health care provider. Fluid restriction may reduce symptoms of heart failure in some people.  Weigh yourself every day. Daily weights  are important in the early recognition of excess fluid. You should weigh yourself every morning after you urinate and before you eat breakfast. Wear the same amount of clothing each time you weigh yourself. Record your daily weight. Provide your health care provider with your weight record.  Monitor and record your blood  pressure if directed by your health care provider.  Check your pulse if directed by your health care provider.  Lose weight if directed by your health care provider. Weight loss may reduce symptoms of heart failure in some people.  Stop smoking or chewing tobacco. Nicotine makes your heart work harder by causing your blood vessels to constrict. Do not use nicotine gum or patches before talking to your health care provider.  Keep all follow-up visits as directed by your health care provider. This is important.  Limit alcohol intake to no more than 1 drink per day for nonpregnant women and 2 drinks per day for men. One drink equals 12 ounces of beer, 5 ounces of wine, or 1 ounces of hard liquor. Drinking more than that is harmful to your heart. Tell your health care provider if you drink alcohol several times a week. Talk with your health care provider about whether alcohol is safe for you. If your heart has already been damaged by alcohol or you have severe heart failure, drinking alcohol should be stopped completely.  Stop illicit drug use.  Stay up-to-date with immunizations. It is especially important to prevent respiratory infections through current pneumococcal and influenza immunizations.  Manage other health conditions such as hypertension, diabetes, thyroid disease, or abnormal heart rhythms as directed by your health care provider.  Learn to manage stress.  Plan rest periods when fatigued.  Learn strategies to manage high temperatures. If the weather is extremely hot:  Avoid vigorous physical activity.  Use air conditioning or fans or seek a cooler location.  Avoid caffeine and alcohol.  Wear loose-fitting, lightweight, and light-colored clothing.  Learn strategies to manage cold temperatures. If the weather is extremely cold:  Avoid vigorous physical activity.  Layer clothes.  Wear mittens or gloves, a hat, and a scarf when going outside.  Avoid alcohol.  Obtain  ongoing education and support as needed.  Participate in or seek rehabilitation as needed to maintain or improve independence and quality of life. SEEK MEDICAL CARE IF:   Your weight increases by 03 lb/1.4 kg in 1 day or 05 lb/2.3 kg in a week.  You have increasing shortness of breath that is unusual for you.  You are unable to participate in your usual physical activities.  You tire easily.  You cough more than normal, especially with physical activity.  You have any or more swelling in areas such as your hands, feet, ankles, or abdomen.  You are unable to sleep because it is hard to breathe.  You feel like your heart is beating fast (palpitations).  You become dizzy or light-headed upon standing up. SEEK IMMEDIATE MEDICAL CARE IF:   You have difficulty breathing.  There is a change in mental status such as decreased alertness or difficulty with concentration.  You have a pain or discomfort in your chest.  You have an episode of fainting (syncope). MAKE SURE YOU:   Understand these instructions.  Will watch your condition.  Will get help right away if you are not doing well or get worse. Document Released: 04/21/2005 Document Revised: 09/05/2013 Document Reviewed: 05/21/2012 [facility] Patient Information 2015 [place], [place]. This information is not intended to  replace advice given to you by your health care provider. Make sure you discuss any questions you have with your health care provider.

## 2014-05-05 NOTE — ED Notes (Signed)
Pt c/o cough and pain in chest after having shoulder sx yesterday at the [place]; pt sts pain with cough

## 2014-05-05 NOTE — ED Provider Notes (Signed)
CSN: [id number]     Arrival date & time 05/05/14  1608 History   First MD Initiated Contact with Patient 05/05/14 1744     Chief Complaint  Patient presents with  . Chest Pain  . Cough     (Consider location/radiation/quality/duration/timing/severity/associated sxs/prior Treatment) HPI Comments: Patient is a 59 yo M PMHx significant for CHF, HTN presenting to the ED for cough and chest pain that began yesterday after under going rotator cuff repair surgery. He states his chest is central and brought on by coughing. Patient also endorses bilateral lower extremity swelling. He states he has had similar cough and chest pain in the past with CHF exacerbation. He has been taking his Lasix without missed doses. He is followed by [facility], last echocardiogram last month. Patient has had a negative stress test and cardiac catheterization within the last six months.   Patient is a 59 y.o. male presenting with chest pain and cough.  Chest Pain Associated symptoms: cough   Associated symptoms: no shortness of breath   Cough Associated symptoms: chest pain   Associated symptoms: no shortness of breath     Past Medical History  Diagnosis Date  . Hypertension   . Back pain   . Cluster headaches    Past Surgical History  Procedure Laterality Date  . Rotator cuff repair Right 04/2012   History reviewed. No pertinent family history. History  Substance Use Topics  . Smoking status: Never Smoker   . Smokeless tobacco: Never Used  . Alcohol Use: Yes     Comment: socially    Review of Systems  Respiratory: Positive for cough. Negative for shortness of breath.   Cardiovascular: Positive for chest pain and leg swelling.  All other systems reviewed and are negative.     Allergies  Propranolol  Home Medications   Prior to Admission medications   Medication Sig Start Date End Date Taking? Authorizing Provider  aspirin 81 MG tablet Take 81 mg by mouth daily.   Yes Historical  Provider, MD  Aspirin-Acetaminophen-Caffeine (GOODY HEADACHE PO) Take 1 packet by mouth daily as needed (for pain).   Yes Historical Provider, MD  atorvastatin (LIPITOR) 40 MG tablet Take 20 mg by mouth daily.   Yes Historical Provider, MD  carvedilol (COREG) 6.25 MG tablet Take 3.125 mg by mouth 2 (two) times daily with a meal.   Yes Historical Provider, MD  cyclobenzaprine (FLEXERIL) 10 MG tablet Take 10 mg by mouth 3 (three) times daily as needed for muscle spasms.    Yes Historical Provider, MD  diclofenac (VOLTAREN) 75 MG EC tablet Take 75 mg by mouth daily as needed for mild pain.    Yes Historical Provider, MD  furosemide (LASIX) 20 MG tablet Take 20 mg by mouth.   Yes Historical Provider, MD  HYDROmorphone (DILAUDID) 2 MG tablet Take 1 mg by mouth every 4 (four) hours as needed for moderate pain or severe pain.   Yes Historical Provider, MD  isosorbide dinitrate (ISORDIL) 30 MG tablet Take 30 mg by mouth at bedtime.   Yes Historical Provider, MD  lisinopril (PRINIVIL,ZESTRIL) 10 MG tablet Take 5 mg by mouth daily.   Yes Historical Provider, MD  LORazepam (ATIVAN) 0.5 MG tablet Take 0.5 mg by mouth every 6 (six) hours as needed for anxiety.   Yes Historical Provider, MD  oxyCODONE (OXY IR/ROXICODONE) 5 MG immediate release tablet Take 10 mg by mouth every 4 (four) hours as needed for pain.   Yes Historical Provider,  MD  ranitidine (ZANTAC) 150 MG capsule Take 150 mg by mouth 2 (two) times daily.   Yes Historical Provider, MD  sertraline (ZOLOFT) 100 MG tablet Take 100 mg by mouth daily.   Yes Historical Provider, MD  traZODone (DESYREL) 100 MG tablet Take 50 mg by mouth at bedtime.    Yes Historical Provider, MD  allopurinol (ZYLOPRIM) 100 MG tablet Take 100 mg by mouth.     Historical Provider, MD  amLODipine (NORVASC) 10 MG tablet Take 10 mg by mouth daily.    Historical Provider, MD  furosemide (LASIX) 40 MG tablet Take 1 tablet (40 mg total) by mouth daily. 05/05/14   Kathaleen Dudziak L  [place], PA-C   BP 129/85 mmHg  Pulse 98  Temp(Src) 98.1 F (36.7 C) (Oral)  Resp 19  SpO2 99% Physical Exam  Constitutional: He is oriented to person, place, and time. He appears well-developed and well-nourished. No distress.  HENT:  Head: Normocephalic and atraumatic.  Right Ear: External ear normal.  Left Ear: External ear normal.  Nose: Nose normal.  Mouth/Throat: Oropharynx is clear and moist.  Eyes: Conjunctivae are normal.  Neck: Normal range of motion. Neck supple.  Cardiovascular: Regular rhythm, normal heart sounds and intact distal pulses.  Tachycardia present.   Pulmonary/Chest: Effort normal. No respiratory distress. He has rales (bilateral bases). He exhibits tenderness.  Coughing on examination.   Abdominal: Soft.  Musculoskeletal: He exhibits edema (1+ BLE).  Left shoulder immobilized.   Neurological: He is alert and oriented to person, place, and time.  Skin: Skin is warm and dry. He is not diaphoretic.  Psychiatric: He has a normal mood and affect.  Nursing note and vitals reviewed.   ED Course  Procedures (including critical care time) Medications  HYDROmorphone (DILAUDID) injection 1 mg (1 mg Intravenous Given 05/05/14 1803)  furosemide (LASIX) injection 40 mg (40 mg Intravenous Given 05/05/14 1832)  HYDROmorphone (DILAUDID) injection 1 mg (1 mg Intravenous Given 05/05/14 2051)    Labs Review Labs Reviewed  BASIC METABOLIC PANEL - Abnormal; Notable for the following:    Glucose, Bld 113 (*)    GFR calc non Af Amer 60 (*)    GFR calc Af Amer 70 (*)    All other components within normal limits  BRAIN NATRIURETIC PEPTIDE - Abnormal; Notable for the following:    B Natriuretic Peptide 517.6 (*)    All other components within normal limits  CBC WITH DIFFERENTIAL  Rosezena Sensor, ED    Imaging Review Dg Chest 1 View  05/05/2014   CLINICAL DATA:  Cough.  EXAM: CHEST - 1 VIEW  COMPARISON:  None.  FINDINGS: The heart size and mediastinal contours are  within normal limits. No pneumothorax or pleural effusion is noted. Interstitial densities are noted in the perihilar and basilar regions consistent with pulmonary edema. The visualized skeletal structures are unremarkable.  IMPRESSION: Moderate bilateral pulmonary edema.   Electronically Signed   By: Roque Lias M.D.   On: 05/05/2014 16:55     EKG Interpretation None      MDM   Final diagnoses:  CHF exacerbation    Filed Vitals:   05/05/14 2045  BP: 129/85  Pulse: 98  Temp:   Resp: 19   Afebrile, NAD, non-toxic appearing, AAOx4. Patient with pulmonary edema, cough, posttussive chest tightness, increased BNP. Symptoms consistent with CHF exacerbation. Symptoms improved after IV lasix administration. Patient with signs of respiratory distress. No hypoxia. Will have patient increase daily Lasix dose and follow up  with his cardiologist. He is agreeable to the medical discharge. Return precautions discussed. Patient is agreeable to plan. Patient is stable at time of discharge Patient d/w with Dr. Juleen China, agrees with plan.          Jeannetta Ellis, PA-C 05/05/14 2315  Raeford Razor, MD 05/06/14 [phone]

## 2014-05-05 NOTE — ED Notes (Signed)
Pt O2 while ambulating was 91%.

## 2014-05-05 NOTE — ED Notes (Signed)
EDP at bedside  

## 2014-12-04 DIAGNOSIS — Z95810 Presence of automatic (implantable) cardiac defibrillator: Secondary | ICD-10-CM

## 2014-12-04 HISTORY — DX: Presence of automatic (implantable) cardiac defibrillator: Z95.810

## 2015-10-19 ENCOUNTER — Emergency Department (HOSPITAL_COMMUNITY)
Admission: EM | Admit: 2015-10-19 | Discharge: 2015-10-19 | Disposition: A | Discharge status: Home / Self Care | Payer: Medicare Other | Attending: Emergency Medicine | Admitting: Emergency Medicine

## 2015-10-19 ENCOUNTER — Encounter (HOSPITAL_COMMUNITY): Payer: Self-pay | Admitting: Emergency Medicine

## 2015-10-19 DIAGNOSIS — M5441 Lumbago with sciatica, right side: Secondary | ICD-10-CM

## 2015-10-19 DIAGNOSIS — M545 Low back pain: Secondary | ICD-10-CM | POA: Diagnosis present

## 2015-10-19 DIAGNOSIS — Z79899 Other long term (current) drug therapy: Secondary | ICD-10-CM | POA: Diagnosis not present

## 2015-10-19 DIAGNOSIS — I10 Essential (primary) hypertension: Secondary | ICD-10-CM | POA: Diagnosis not present

## 2015-10-19 DIAGNOSIS — Z95810 Presence of automatic (implantable) cardiac defibrillator: Secondary | ICD-10-CM | POA: Diagnosis not present

## 2015-10-19 DIAGNOSIS — Z7982 Long term (current) use of aspirin: Secondary | ICD-10-CM | POA: Diagnosis not present

## 2015-10-19 HISTORY — DX: Presence of automatic (implantable) cardiac defibrillator: Z95.810

## 2015-10-19 MED ORDER — DIAZEPAM 5 MG/ML IJ SOLN
5.0000 mg | Freq: Once | INTRAMUSCULAR | Status: AC
Start: 2015-10-19 — End: 2015-10-19
  Administered 2015-10-19: 5 mg via INTRAMUSCULAR

## 2015-10-19 MED ORDER — DIAZEPAM 5 MG/ML IJ SOLN
5.0000 mg | Freq: Once | INTRAMUSCULAR | Status: DC
  Filled 2015-10-19: qty 2

## 2015-10-19 MED ORDER — HYDROMORPHONE HCL 1 MG/ML IJ SOLN
1.0000 mg | Freq: Once | INTRAMUSCULAR | Status: AC
  Administered 2015-10-19: 1 mg via INTRAMUSCULAR
  Filled 2015-10-19: qty 1

## 2015-10-19 NOTE — Discharge Instructions (Signed)

## 2015-10-19 NOTE — ED Notes (Signed)
Patient states chronic back pain and has been being seen by [place] in [place].  Patient states R low back pain that runs into his R leg.   [facility] and recently finished his last prednisone pack.   Patient states he did drive in today to be seen.

## 2015-10-19 NOTE — ED Provider Notes (Signed)
CSN: [id number]     Arrival date & time 10/19/15  0755 History   First MD Initiated Contact with Patient 10/19/15 0805     Chief Complaint  Patient presents with  . Back Pain     (Consider location/radiation/quality/duration/timing/severity/associated sxs/prior Treatment) HPI Comments: Patient with a history of chronic back pain presents today with pain across his lower back.  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  He reports that he has been told in the past that he has "2 bulging discs" of his lower back.  Pain worsened over three days.  He began having worsening pain after sleeping in a bed at a hotel three days ago.  No acute injury or trauma.  Pain is worse with movement.  He states that he has been taking Oxycodone, Flexeril, and Diclofenac for the pain with little improvement.  He also recently finished a course of Prednisone two days ago.  He reports that the pain today is similar to pain that he has had in the past.  He reports occasional radiation of the pain into his right hip and down his right leg.  He denies saddle anaesthesia, bowel/bladder incontinence, fever, urinary symptoms, or any other symptoms at this time.  He is ambulatory, but states that he has been ambulating with a cane over the past couple of days.   Patient is a 60 y.o. male presenting with back pain.  Back Pain   Past Medical History  Diagnosis Date  . Hypertension   . Back pain   . Cluster headaches   . Cardiac defibrillator in place    Past Surgical History  Procedure Laterality Date  . Rotator cuff repair Right 04/2012   No family history on file. Social History  Substance Use Topics  . Smoking status: Never Smoker   . Smokeless tobacco: Never Used  . Alcohol Use: Yes     Comment: socially    Review of Systems  Musculoskeletal: Positive for back pain.  All other systems reviewed and are negative.     Allergies  Propranolol  Home Medications    Prior to Admission medications   Medication Sig Start Date End Date Taking? Authorizing Provider  allopurinol (ZYLOPRIM) 100 MG tablet Take 100 mg by mouth daily.    Yes Historical Provider, MD  amLODipine (NORVASC) 10 MG tablet Take 10 mg by mouth daily.   Yes Historical Provider, MD  aspirin 81 MG tablet Take 81 mg by mouth daily.   Yes Historical Provider, MD  Aspirin-Acetaminophen-Caffeine (GOODY HEADACHE PO) Take 1 packet by mouth daily as needed (for pain).   Yes Historical Provider, MD  atorvastatin (LIPITOR) 40 MG tablet Take 20 mg by mouth daily.   Yes Historical Provider, MD  carvedilol (COREG) 6.25 MG tablet Take 3.125 mg by mouth 2 (two) times daily with a meal.   Yes Historical Provider, MD  cyclobenzaprine (FLEXERIL) 10 MG tablet Take 10 mg by mouth 3 (three) times daily as needed for muscle spasms.    Yes Historical Provider, MD  diclofenac (VOLTAREN) 75 MG EC tablet Take 75 mg by mouth daily as needed for mild pain.    Yes Historical Provider, MD  furosemide (LASIX) 40 MG tablet Take 1 tablet (40 mg total) by mouth daily. 05/05/14  Yes Jennifer Piepenbrink, PA-C  isosorbide dinitrate (ISORDIL) 30 MG tablet Take 30 mg by mouth at bedtime.   Yes Historical Provider, MD  LORazepam (ATIVAN) 0.5 MG  tablet Take 0.5 mg by mouth every 6 (six) hours as needed for anxiety.   Yes Historical Provider, MD  oxyCODONE (OXY IR/ROXICODONE) 5 MG immediate release tablet Take 10 mg by mouth every 4 (four) hours as needed for pain.   Yes Historical Provider, MD  ranitidine (ZANTAC) 150 MG capsule Take 150 mg by mouth 2 (two) times daily.   Yes Historical Provider, MD  sertraline (ZOLOFT) 100 MG tablet Take 100 mg by mouth daily.   Yes Historical Provider, MD  traZODone (DESYREL) 100 MG tablet Take 50 mg by mouth at bedtime.    Yes Historical Provider, MD  lisinopril (PRINIVIL,ZESTRIL) 10 MG tablet Take 5 mg by mouth daily.    Historical Provider, MD   BP 129/79 mmHg  Pulse 100  Temp(Src) 97.8 F  (36.6 C) (Oral)  Resp 18  SpO2 99% Physical Exam  Constitutional: He appears well-nourished.  HENT:  Head: Normocephalic and atraumatic.  Neck: Normal range of motion. Neck supple.  Cardiovascular: Normal rate, regular rhythm and normal heart sounds.   Pulmonary/Chest: Effort normal and breath sounds normal.  Musculoskeletal: Normal range of motion.  Tenderness to palpation across lower back.    Neurological: He is alert. He has normal strength. No sensory deficit.  Reflex Scores:      Patellar reflexes are 2+ on the right side and 2+ on the left side. Patient ambulating with a cane without difficulty] Distal sensation of both feet intact Muscle strength LE 5/5 bilaterally  Skin: Skin is warm and dry.  Psychiatric: He has a normal mood and affect.  Nursing note and vitals reviewed.   ED Course  Procedures (including critical care time) Labs Review Labs Reviewed - No data to display  Imaging Review No results found. I have personally reviewed and evaluated these images and lab results as part of my medical decision-making.   EKG Interpretation None      MDM   Final diagnoses:  None   Patient with a history of chronic back pain presents today with pain across his lower back.  No acute injury or trauma.  No neurological deficits and normal neuro exam.  Patient can walk but states is painful.  No loss of bowel or bladder control.  No concern for cauda equina.  No fever, night sweats, weight loss, h/o cancer, IVDU.  Pain improved in the ED after IM Dilaudid and Valium.  Patient instructed to continue the Diclofenac, Oxycodone, and Flexeril.  Stable for discharge.  Return precautions given.       Santiago Glad, PA-C 10/19/15 1029  Raeford Razor, MD 10/26/15 2206

## 2015-11-02 ENCOUNTER — Encounter (HOSPITAL_COMMUNITY): Payer: Self-pay | Admitting: Emergency Medicine

## 2015-11-02 ENCOUNTER — Emergency Department (HOSPITAL_COMMUNITY)
Admission: EM | Admit: 2015-11-02 | Discharge: 2015-11-02 | Disposition: A | Discharge status: Home / Self Care | Payer: Medicare Other | Attending: Emergency Medicine | Admitting: Emergency Medicine

## 2015-11-02 ENCOUNTER — Emergency Department (HOSPITAL_COMMUNITY): Payer: Medicare Other

## 2015-11-02 DIAGNOSIS — Z7982 Long term (current) use of aspirin: Secondary | ICD-10-CM | POA: Insufficient documentation

## 2015-11-02 DIAGNOSIS — R0789 Other chest pain: Secondary | ICD-10-CM | POA: Diagnosis not present

## 2015-11-02 DIAGNOSIS — Z79899 Other long term (current) drug therapy: Secondary | ICD-10-CM | POA: Insufficient documentation

## 2015-11-02 DIAGNOSIS — R079 Chest pain, unspecified: Secondary | ICD-10-CM | POA: Diagnosis present

## 2015-11-02 DIAGNOSIS — R0602 Shortness of breath: Secondary | ICD-10-CM | POA: Insufficient documentation

## 2015-11-02 DIAGNOSIS — I10 Essential (primary) hypertension: Secondary | ICD-10-CM | POA: Diagnosis not present

## 2015-11-02 LAB — COMPREHENSIVE METABOLIC PANEL
ALBUMIN: 3.9 g/dL (ref 3.5–5.0)
ALK PHOS: 60 U/L (ref 38–126)
ALT: 26 U/L (ref 17–63)
AST: 22 U/L (ref 15–41)
Anion gap: 8 (ref 5–15)
BILIRUBIN TOTAL: 0.8 mg/dL (ref 0.3–1.2)
BUN: 14 mg/dL (ref 6–20)
CALCIUM: 9.9 mg/dL (ref 8.9–10.3)
CO2: 27 mmol/L (ref 22–32)
Chloride: 102 mmol/L (ref 101–111)
Creatinine, Ser: 1.24 mg/dL (ref 0.61–1.24)
GFR calc Af Amer: >60 mL/min (ref >60)
GFR calc non Af Amer: >60 mL/min (ref >60)
GLUCOSE: 143 mg/dL — AB (ref 65–99)
Potassium: 4.3 mmol/L (ref 3.5–5.1)
SODIUM: 137 mmol/L (ref 135–145)
TOTAL PROTEIN: 7.1 g/dL (ref 6.5–8.1)

## 2015-11-02 LAB — I-STAT TROPONIN, ED
TROPONIN I, POC: 0.01 ng/mL (ref 0.00–0.08)
Troponin i, poc: 0 ng/mL (ref 0.00–0.08)

## 2015-11-02 LAB — CBC
HEMATOCRIT: 46.2 % (ref 39.0–52.0)
HEMOGLOBIN: 15.1 g/dL (ref 13.0–17.0)
MCH: 28.3 pg (ref 26.0–34.0)
MCHC: 32.7 g/dL (ref 30.0–36.0)
MCV: 86.7 fL (ref 78.0–100.0)
Platelets: 185 10*3/uL (ref 150–400)
RBC: 5.33 MIL/uL (ref 4.22–5.81)
RDW: 14.6 % (ref 11.5–15.5)
WBC: 4.4 10*3/uL (ref 4.0–10.5)

## 2015-11-02 LAB — D-DIMER, QUANTITATIVE: D-Dimer, Quant: 0.29 ug/mL-FEU (ref 0.00–0.50)

## 2015-11-02 LAB — BRAIN NATRIURETIC PEPTIDE: B Natriuretic Peptide: 10.1 pg/mL (ref 0.0–100.0)

## 2015-11-02 MED ORDER — MORPHINE SULFATE (PF) 4 MG/ML IV SOLN
4.0000 mg | Freq: Once | INTRAVENOUS | Status: AC
  Administered 2015-11-02: 4 mg via INTRAVENOUS
  Filled 2015-11-02: qty 1

## 2015-11-02 MED ORDER — ONDANSETRON HCL 4 MG/2ML IJ SOLN
4.0000 mg | Freq: Once | INTRAMUSCULAR | Status: AC
  Administered 2015-11-02: 4 mg via INTRAVENOUS
  Filled 2015-11-02: qty 2

## 2015-11-02 MED ORDER — HYDROMORPHONE HCL 1 MG/ML IJ SOLN
1.0000 mg | Freq: Once | INTRAMUSCULAR | Status: AC
Start: 2015-11-02 — End: 2015-11-02
  Administered 2015-11-02: 1 mg via INTRAVENOUS
  Filled 2015-11-02: qty 1

## 2015-11-02 NOTE — ED Notes (Signed)
Patient transported to X-ray 

## 2015-11-02 NOTE — ED Provider Notes (Signed)
CSN: [id number]     Arrival date & time 11/02/15  4098 History   First MD Initiated Contact with Patient 11/02/15 [phone]     Chief Complaint  Patient presents with  . Chest Pain     (Consider location/radiation/quality/duration/timing/severity/associated sxs/prior Treatment) HPI  Pt presenting with c/o acute onset of chest pain on right side of his chest after coughing and sneezing at the same time.  Immediately afterward he had sharp shooting pain in right side of his chest.  Chest wall is sore and he feels that he is taking shallow breaths, if he breathes deeply the pain is much worse.  No nausea or diaphoresis, no radiation of pain.  No lower extremity swelling.  He has hx of HTN, CHF- defibrillator for low EF.  States he had a normal catheterization approx 1 year ago- is followed by cardiology at [facility]  Past Medical History  Diagnosis Date  . Hypertension   . Back pain   . Cluster headaches   . Cardiac defibrillator in place    Past Surgical History  Procedure Laterality Date  . Rotator cuff repair Right 04/2012   No family history on file. Social History  Substance Use Topics  . Smoking status: Never Smoker   . Smokeless tobacco: Never Used  . Alcohol Use: Yes     Comment: socially    Review of Systems  ROS reviewed and all otherwise negative except for mentioned in HPI    Allergies  Propranolol  Home Medications   Prior to Admission medications   Medication Sig Start Date End Date Taking? Authorizing Provider  allopurinol (ZYLOPRIM) 100 MG tablet Take 100 mg by mouth daily.    Yes Historical Provider, MD  aspirin 81 MG tablet Take 81 mg by mouth daily.   Yes Historical Provider, MD  Aspirin-Acetaminophen-Caffeine (GOODY HEADACHE PO) Take 1 packet by mouth daily as needed (for pain).   Yes Historical Provider, MD  atorvastatin (LIPITOR) 40 MG tablet Take 20 mg by mouth daily.   Yes Historical Provider, MD  carvedilol (COREG) 6.25 MG tablet Take 3.125 mg by mouth  2 (two) times daily with a meal.   Yes Historical Provider, MD  cyclobenzaprine (FLEXERIL) 10 MG tablet Take 10 mg by mouth 3 (three) times daily as needed for muscle spasms.    Yes Historical Provider, MD  diclofenac (VOLTAREN) 75 MG EC tablet Take 75 mg by mouth daily as needed for mild pain.    Yes Historical Provider, MD  furosemide (LASIX) 40 MG tablet Take 1 tablet (40 mg total) by mouth daily. 05/05/14  Yes Jennifer Piepenbrink, PA-C  isosorbide dinitrate (ISORDIL) 30 MG tablet Take 30 mg by mouth at bedtime.   Yes Historical Provider, MD  LORazepam (ATIVAN) 0.5 MG tablet Take 0.5 mg by mouth every 6 (six) hours as needed for anxiety.   Yes Historical Provider, MD  LOSARTAN POTASSIUM PO Take 0.5 mg by mouth daily.   Yes Historical Provider, MD  oxyCODONE (OXY IR/ROXICODONE) 5 MG immediate release tablet Take 10 mg by mouth every 4 (four) hours as needed for pain.   Yes Historical Provider, MD  ranitidine (ZANTAC) 150 MG capsule Take 150 mg by mouth 2 (two) times daily.   Yes Historical Provider, MD  sertraline (ZOLOFT) 100 MG tablet Take 100 mg by mouth daily.   Yes Historical Provider, MD  SPIRONOLACTONE PO Take 0.5 tablets by mouth daily.   Yes Historical Provider, MD  traZODone (DESYREL) 100 MG tablet Take 100  mg by mouth at bedtime as needed for sleep.    Yes Historical Provider, MD   BP 119/81 mmHg  Pulse 59  Temp(Src) 98.2 F (36.8 C) (Oral)  Resp 14  SpO2 98%  Vitals reviewed Physical Exam  Physical Examination: General appearance - alert, well appearing, and in no distress Mental status - alert, oriented to person, place, and time Eyes - no conjunctival injection no scleral icterus Chest - clear to auscultation, no wheezes, rales or rhonchi, symmetric air entry, ttp in right anterior chest wall, no crepitus Heart - normal rate, regular rhythm, normal S1, S2, no murmurs, rubs, clicks or gallops Abdomen - soft, nontender, nondistended, no masses or organomegaly Neurological -  alert, oriented, normal speech Extremities - peripheral pulses normal, no pedal edema, no clubbing or cyanosis Skin - normal coloration and turgor, no rashes  ED Course  Procedures (including critical care time) Labs Review Labs Reviewed  COMPREHENSIVE METABOLIC PANEL - Abnormal; Notable for the following:    Glucose, Bld 143 (*)    All other components within normal limits  BRAIN NATRIURETIC PEPTIDE  CBC  D-DIMER, QUANTITATIVE (NOT AT [facility])  Rosezena Sensor, ED  Rosezena Sensor, ED    Imaging Review Dg Chest 2 View  11/02/2015  CLINICAL DATA:  Chest pain. EXAM: CHEST  2 VIEW COMPARISON:  Radiographs of May 05, 2014. FINDINGS: The heart size and mediastinal contours are within normal limits. Both lungs are clear. No pneumothorax or pleural effusion is noted. Interval placement of left-sided pacemaker with leads in grossly good position. The visualized skeletal structures are unremarkable. IMPRESSION: No active cardiopulmonary disease. Electronically Signed   By: Lupita Raider, M.D.   On: 11/02/2015 10:33   I have personally reviewed and evaluated these images and lab results as part of my medical decision-making.   EKG Interpretation   Date/Time:  Friday November 02 2015 09:25:29 EDT Ventricular Rate:  65 PR Interval:    QRS Duration: 105 QT Interval:  404 QTC Calculation: 420 R Axis:   -3 Text Interpretation:  Sinus rhythm Abnormal R-wave progression, early  transition Left ventricular hypertrophy Since previous tracing Left bundle  branch block is no longer present and rate slower Confirmed by [facility]  MD,  Kasarah Sitts [phone]) on 11/02/2015 9:31:20 AM      MDM   Final diagnoses:  Chest wall pain    Pt presenting after acute onset of pain in his right chest wall after coughing and sneezing at same time.  Pain is much worse with movement and palpation.  Due to his PMHx- 2 sets of troponins checked and negative- pt is heart score of 3.  Doubt ACS.  Cannot r/o by PERC due to  his age and there is a pleuritic component to his pain so d-dimer checked and was negative- doubt PE.  discused all results with patient, pain medicine has helped to improve his symptoms.  He takes oxycodone and has these at home as well as muscle relaxer and antiinflammatories- he was advised to take all of these and arrange for close f/u with his PMD.  Discharged with strict return precautions.  Pt agreeable with plan.  Pt has a heart score of 3  Jerelyn Scott, MD 11/02/15 [phone]

## 2015-11-02 NOTE — ED Notes (Signed)
Pt in from home after CP that began at 0200. Pt was sitting, coughed and sneezed at same time and a tight, pressure started in his central chest. Pt has hx of HF (EF of 25%), pacemaker/defib placed last year, and "mini heart attacks". Sees cardiologist, Dr. Wanda Plump. Pt took 2 NTG this morning, got no relief. Pt presents with sob, 6/10 nonradiating pain.

## 2015-11-02 NOTE — Discharge Instructions (Signed)
Return to the ED with any concerns including difficulty breathing, worsening pain, leg swelling, fainting, or any other alarming symptoms °

## 2016-01-16 ENCOUNTER — Emergency Department (HOSPITAL_COMMUNITY): Payer: Medicare Other

## 2016-01-16 ENCOUNTER — Encounter (HOSPITAL_COMMUNITY): Payer: Self-pay | Admitting: Emergency Medicine

## 2016-01-16 DIAGNOSIS — Z95810 Presence of automatic (implantable) cardiac defibrillator: Secondary | ICD-10-CM | POA: Insufficient documentation

## 2016-01-16 DIAGNOSIS — R071 Chest pain on breathing: Secondary | ICD-10-CM | POA: Diagnosis present

## 2016-01-16 DIAGNOSIS — Z7982 Long term (current) use of aspirin: Secondary | ICD-10-CM | POA: Insufficient documentation

## 2016-01-16 DIAGNOSIS — R0789 Other chest pain: Secondary | ICD-10-CM | POA: Insufficient documentation

## 2016-01-16 DIAGNOSIS — I10 Essential (primary) hypertension: Secondary | ICD-10-CM | POA: Insufficient documentation

## 2016-01-16 DIAGNOSIS — R0602 Shortness of breath: Secondary | ICD-10-CM | POA: Insufficient documentation

## 2016-01-16 LAB — BASIC METABOLIC PANEL
Anion gap: 10 (ref 5–15)
BUN: 12 mg/dL (ref 6–20)
CHLORIDE: 101 mmol/L (ref 101–111)
CO2: 27 mmol/L (ref 22–32)
Calcium: 10.1 mg/dL (ref 8.9–10.3)
Creatinine, Ser: 1.55 mg/dL — ABNORMAL HIGH (ref 0.61–1.24)
GFR calc Af Amer: 56 mL/min — ABNORMAL LOW (ref >60)
GFR calc non Af Amer: 48 mL/min — ABNORMAL LOW (ref >60)
Glucose, Bld: 136 mg/dL — ABNORMAL HIGH (ref 65–99)
POTASSIUM: 3.6 mmol/L (ref 3.5–5.1)
SODIUM: 138 mmol/L (ref 135–145)

## 2016-01-16 LAB — CBC
HEMATOCRIT: 46 % (ref 39.0–52.0)
Hemoglobin: 15.2 g/dL (ref 13.0–17.0)
MCH: 28.5 pg (ref 26.0–34.0)
MCHC: 33 g/dL (ref 30.0–36.0)
MCV: 86.3 fL (ref 78.0–100.0)
Platelets: 206 10*3/uL (ref 150–400)
RBC: 5.33 MIL/uL (ref 4.22–5.81)
RDW: 14.2 % (ref 11.5–15.5)
WBC: 5 10*3/uL (ref 4.0–10.5)

## 2016-01-16 LAB — I-STAT TROPONIN, ED: Troponin i, poc: 0 ng/mL (ref 0.00–0.08)

## 2016-01-16 NOTE — ED Triage Notes (Signed)
Pt. reports central chest pain onset yesterday , mild SOB , no nausea or diaphoresis .

## 2016-01-17 ENCOUNTER — Emergency Department (HOSPITAL_COMMUNITY): Payer: Medicare Other

## 2016-01-17 ENCOUNTER — Emergency Department (HOSPITAL_COMMUNITY)
Admission: EM | Admit: 2016-01-17 | Discharge: 2016-01-17 | Disposition: A | Discharge status: Home / Self Care | Payer: Medicare Other | Attending: Emergency Medicine | Admitting: Emergency Medicine

## 2016-01-17 DIAGNOSIS — R0789 Other chest pain: Secondary | ICD-10-CM

## 2016-01-17 LAB — D-DIMER, QUANTITATIVE (NOT AT ARMC): D DIMER QUANT: 0.51 ug{FEU}/mL — AB (ref 0.00–0.50)

## 2016-01-17 LAB — I-STAT TROPONIN, ED: TROPONIN I, POC: 0 ng/mL (ref 0.00–0.08)

## 2016-01-17 LAB — BRAIN NATRIURETIC PEPTIDE: B Natriuretic Peptide: 8.5 pg/mL (ref 0.0–100.0)

## 2016-01-17 MED ORDER — METHOCARBAMOL 500 MG PO TABS: 1000.0000 mg | ORAL_TABLET | Freq: Three times a day (TID) | ORAL | 0 refills | Status: DC | PRN

## 2016-01-17 MED ORDER — IOPAMIDOL (ISOVUE-370) INJECTION 76%
INTRAVENOUS | Status: AC
  Administered 2016-01-17: 100 mL
  Filled 2016-01-17: qty 100

## 2016-01-17 MED ORDER — SODIUM CHLORIDE 0.9 % IV BOLUS (SEPSIS)
1000.0000 mL | Freq: Once | INTRAVENOUS | Status: AC
  Administered 2016-01-17: 1000 mL via INTRAVENOUS

## 2016-01-17 NOTE — ED Provider Notes (Signed)
[facility]-EMERGENCY DEPT Provider Note   CSN: [id number] Arrival date & time: 01/16/16  1857  By signing my name below, I, Majel Homer, attest that this documentation has been prepared under the direction and in the presence of Loren Racer, MD . Electronically Signed: Majel Homer, Scribe. 01/17/2016. 12:38 AM.  History   Chief Complaint Chief Complaint  Patient presents with  . Chest Pain   The history is provided by the patient. No language interpreter was used.   HPI Comments: Kristopher Scott is a 60 y.o. male with who presents to the Emergency Department complaining of gradually improving, persistent, 4/10, central chest pain that began 2 days ago. He states his pain is exacerbated when coughing and breathing deeply. Pt reports he was working in his yard at the onset of his pain. He notes associated shortness of breath, lightheadedness, chills and productive cough. He reports he took 325 mg aspirin and 1 NTG with moderate relief PTA. He denies fever. Pt reports he was contacted by a nurse at the [facility] this evening advising him to visit the ED due to abnormal vital signs. He notes he has a cardiac defibrillator in place and states his last cardiac catheterization was performed in 2015. He denies hx of cardiac stents.   Past Medical History:  Diagnosis Date  . Back pain   . Cardiac defibrillator in place   . Cluster headaches   . Hypertension     There are no active problems to display for this patient.   Past Surgical History:  Procedure Laterality Date  . ROTATOR CUFF REPAIR Right 04/2012       Home Medications    Prior to Admission medications   Medication Sig Start Date End Date Taking? Authorizing Provider  allopurinol (ZYLOPRIM) 100 MG tablet Take 100 mg by mouth daily.     Historical Provider, MD  aspirin 81 MG tablet Take 81 mg by mouth daily.    Historical Provider, MD  Aspirin-Acetaminophen-Caffeine (GOODY HEADACHE PO) Take 1 packet by mouth daily as needed  (for pain).    Historical Provider, MD  atorvastatin (LIPITOR) 40 MG tablet Take 20 mg by mouth daily.    Historical Provider, MD  carvedilol (COREG) 6.25 MG tablet Take 3.125 mg by mouth 2 (two) times daily with a meal.    Historical Provider, MD  cyclobenzaprine (FLEXERIL) 10 MG tablet Take 10 mg by mouth 3 (three) times daily as needed for muscle spasms.     Historical Provider, MD  diclofenac (VOLTAREN) 75 MG EC tablet Take 75 mg by mouth daily as needed for mild pain.     Historical Provider, MD  furosemide (LASIX) 40 MG tablet Take 1 tablet (40 mg total) by mouth daily. 05/05/14   Jennifer Piepenbrink, PA-C  isosorbide dinitrate (ISORDIL) 30 MG tablet Take 30 mg by mouth at bedtime.    Historical Provider, MD  LORazepam (ATIVAN) 0.5 MG tablet Take 0.5 mg by mouth every 6 (six) hours as needed for anxiety.    Historical Provider, MD  LOSARTAN POTASSIUM PO Take 0.5 mg by mouth daily.    Historical Provider, MD  methocarbamol (ROBAXIN) 500 MG tablet Take 2 tablets (1,000 mg total) by mouth every 8 (eight) hours as needed for muscle spasms. 01/17/16   Loren Racer, MD  oxyCODONE (OXY IR/ROXICODONE) 5 MG immediate release tablet Take 10 mg by mouth every 4 (four) hours as needed for pain.    Historical Provider, MD  ranitidine (ZANTAC) 150 MG capsule Take 150  mg by mouth 2 (two) times daily.    Historical Provider, MD  sertraline (ZOLOFT) 100 MG tablet Take 100 mg by mouth daily.    Historical Provider, MD  SPIRONOLACTONE PO Take 0.5 tablets by mouth daily.    Historical Provider, MD  traZODone (DESYREL) 100 MG tablet Take 100 mg by mouth at bedtime as needed for sleep.     Historical Provider, MD    Family History No family history on file.  Social History Social History  Substance Use Topics  . Smoking status: Never Smoker  . Smokeless tobacco: Never Used  . Alcohol use Yes     Comment: socially     Allergies   Propranolol   Review of Systems Review of Systems  Constitutional:  Positive for chills. Negative for fever.  Respiratory: Positive for cough and shortness of breath. Negative for chest tightness and wheezing.   Cardiovascular: Positive for chest pain. Negative for palpitations and leg swelling.  Gastrointestinal: Negative for abdominal pain, diarrhea, nausea and vomiting.  Genitourinary: Negative for flank pain, frequency and hematuria.  Musculoskeletal: Negative for back pain, myalgias, neck pain and neck stiffness.  Skin: Negative for rash and wound.  Neurological: Positive for light-headedness. Negative for dizziness, syncope, weakness, numbness and headaches.  All other systems reviewed and are negative.  Physical Exam Updated Vital Signs BP 128/81   Pulse 84   Resp 18   Ht 6\' 1"  (1.854 m)   Wt 249 lb (112.9 kg)   SpO2 100%   BMI 32.85 kg/m   Physical Exam  Constitutional: He is oriented to person, place, and time. He appears well-developed and well-nourished. No distress.  HENT:  Head: Normocephalic and atraumatic.  Mouth/Throat: Oropharynx is clear and moist.  Eyes: EOM are normal. Pupils are equal, round, and reactive to light.  Neck: Normal range of motion. Neck supple. No JVD present.  Cardiovascular: Normal rate and regular rhythm.  Exam reveals no gallop and no friction rub.   No murmur heard. Pulmonary/Chest: Effort normal and breath sounds normal. He exhibits tenderness (chest pain reproduced with palpation of the left sternal border. No crepitance or deformity.).  Abdominal: Soft. Bowel sounds are normal. There is no tenderness. There is no rebound and no guarding.  Musculoskeletal: Normal range of motion. He exhibits no edema or tenderness.  Mild left calf tenderness with palpation. No obvious edema or asymmetry. Distal pulses intact.  Neurological: He is alert and oriented to person, place, and time.  Moves all extremities without deficit. Sensation intact.  Skin: Skin is warm and dry. Capillary refill takes less than 2 seconds.  No rash noted. No erythema.  Psychiatric: He has a normal mood and affect. His behavior is normal.  Nursing note and vitals reviewed.    ED Treatments / Results  Labs (all labs ordered are listed, but only abnormal results are displayed) Labs Reviewed  BASIC METABOLIC PANEL - Abnormal; Notable for the following:       Result Value   Glucose, Bld 136 (*)    Creatinine, Ser 1.55 (*)    GFR calc non Af Amer 48 (*)    GFR calc Af Amer 56 (*)    All other components within normal limits  D-DIMER, QUANTITATIVE (NOT AT [facility]) - Abnormal; Notable for the following:    D-Dimer, Quant 0.51 (*)    All other components within normal limits  CBC  BRAIN NATRIURETIC PEPTIDE  I-STAT TROPOININ, ED  I-STAT TROPOININ, ED    EKG  EKG  Interpretation  Date/Time:  Wednesday January 16 2016 19:21:28 EDT Ventricular Rate:  83 PR Interval:  148 QRS Duration: 96 QT Interval:  362 QTC Calculation: 425 R Axis:   -6 Text Interpretation:  Atrial-sensed ventricular-paced rhythm Abnormal ECG Confirmed by Ranae Palms  MD, Shondrea Steinert ([id number]) on 01/17/2016 12:23:03 AM       Radiology Dg Chest 2 View  Result Date: 01/16/2016 CLINICAL DATA:  Central chest pain, cough, congestion, shortness of breath and nausea for 2 days. EXAM: CHEST  2 VIEW COMPARISON:  Radiographs 11/02/2015 FINDINGS: Multi lead left-sided pacemaker remains in place, leads unchanged in position. Stable cardiomediastinal contours. No pulmonary edema, pleural effusion, or pneumothorax. No focal airspace disease. Mild degenerative change in the spine. IMPRESSION: No active cardiopulmonary disease. Electronically Signed   By: Rubye Oaks M.D.   On: 01/16/2016 20:40   Ct Angio Chest Pe W Or Wo Contrast  Result Date: 01/17/2016 CLINICAL DATA:  Chest pain and shortness of breath. EXAM: CT ANGIOGRAPHY CHEST WITH CONTRAST TECHNIQUE: Multidetector CT imaging of the chest was performed using the standard protocol during bolus administration of  intravenous contrast. Multiplanar CT image reconstructions and MIPs were obtained to evaluate the vascular anatomy. CONTRAST:  100 mL Isovue 370 IV COMPARISON:  Chest radiograph 01/16/2016 FINDINGS: Vascular: No pulmonary embolus. The main pulmonary artery is within normal limits for size. No CT evidence of acute right heart strain. The ascending aorta measures at the upper limits of normal. There is atherosclerotic calcification seen in the coronary arteries. There is a left chest wall pacemaker. Heart size is normal. Mediastinum/Nodes: No mediastinal or axillary adenopathy. Lungs: No pulmonary nodules or masses. No pleural effusion or focal consolidation. Visualized abdomen: Contrast bolus timing is not optimized for evaluation of the abdominal organs. There is a cystic structure in the right mid abdomen, incompletely visualized but suspected to be arising from the upper pole of the kidney and measuring 5.4 x 5.4 cm. Musculoskeletal: No lytic or blastic osseous lesions. The visualized extrathoracic soft tissues are normal. Review of the MIP images confirms the above findings. IMPRESSION: 1. No pulmonary embolus. 2. No acute process of the chest. 3. Incompletely visualized cystic structure in the right hemi abdomen, suspected to be a upper pole right renal cyst. 4. Ascending aorta measures upper limits of normal at 5 cm. Electronically Signed   By: Deatra Robinson M.D.   On: 01/17/2016 02:21    Procedures Procedures (including critical care time)  Medications Ordered in ED Medications  sodium chloride 0.9 % bolus 1,000 mL (0 mLs Intravenous Stopped 01/17/16 0358)  iopamidol (ISOVUE-370) 76 % injection (100 mLs  Contrast Given 01/17/16 0150)     Initial Impression / Assessment and Plan / ED Course  I have reviewed the triage vital signs and the nursing notes.  Pertinent labs & imaging results that were available during my care of the patient were reviewed by me and considered in my medical decision  making (see chart for details).  Clinical Course  DIAGNOSTIC STUDIES:  Oxygen Saturation is 100% on RA, normal by my interpretation.    COORDINATION OF CARE:  12:36 AM Discussed treatment plan with pt at bedside and pt agreed to plan.  Patient's chest pain is completely reproduced with palpation. EKG with no evidence of ischemia. Troponin 2 is normal. Mild elevation in d-dimer but CT injury with chest without evidence of PE or dissection. Patient is quite comfortable appearing. Stable vital signs. Discussed results of testing and need to follow-up with his primary  provider as well as his cardiologist. Likely muscular skeletal in nature. Started on Robaxin. Return precautions given.   I personally performed the services described in this documentation, which was scribed in my presence. The recorded information has been reviewed and is accurate.   Final Clinical Impressions(s) / ED Diagnoses   Final diagnoses:  Chest wall pain    New Prescriptions New Prescriptions   METHOCARBAMOL (ROBAXIN) 500 MG TABLET    Take 2 tablets (1,000 mg total) by mouth every 8 (eight) hours as needed for muscle spasms.     Loren Raceravid Brynnlie Unterreiner, MD 01/17/16 [phone]

## 2016-01-17 NOTE — ED Notes (Signed)
MD at bedside. 

## 2016-01-17 NOTE — ED Notes (Signed)
Patient transported to CT 

## 2016-02-04 ENCOUNTER — Other Ambulatory Visit: Payer: Self-pay | Admitting: Orthopedic Surgery

## 2016-02-04 DIAGNOSIS — M5416 Radiculopathy, lumbar region: Secondary | ICD-10-CM

## 2016-02-21 ENCOUNTER — Ambulatory Visit
Admission: RE | Admit: 2016-02-21 | Discharge: 2016-02-21 | Disposition: A | Discharge status: Home / Self Care | Payer: Medicare Other | Source: Ambulatory Visit | Attending: Orthopedic Surgery | Admitting: Orthopedic Surgery

## 2016-02-21 DIAGNOSIS — M5416 Radiculopathy, lumbar region: Secondary | ICD-10-CM

## 2016-02-21 MED ORDER — IOPAMIDOL (ISOVUE-M 200) INJECTION 41%
17.0000 mL | Freq: Once | INTRAMUSCULAR | Status: AC
  Administered 2016-02-21: 17 mL via INTRATHECAL

## 2016-02-21 MED ORDER — DIAZEPAM 5 MG PO TABS
10.0000 mg | ORAL_TABLET | Freq: Once | ORAL | Status: AC
  Administered 2016-02-21: 10 mg via ORAL

## 2016-02-21 NOTE — Progress Notes (Signed)
Patient states he has been off Zoloft and Trazodone for at least the past two days.  jkl

## 2016-02-21 NOTE — Discharge Instructions (Signed)
Myelogram Discharge Instructions  1. Go home and rest quietly for the next 24 hours.  It is important to lie flat for the next 24 hours.  Get up only to go to the restroom.  You may lie in the bed or on a couch on your back, your stomach, your left side or your right side.  You may have one pillow under your head.  You may have pillows between your knees while you are on your side or under your knees while you are on your back.  2. DO NOT drive today.  Recline the seat as far back as it will go, while still wearing your seat belt, on the way home.  3. You may get up to go to the bathroom as needed.  You may sit up for 10 minutes to eat.  You may resume your normal diet and medications unless otherwise indicated.  Drink plenty of extra fluids today and tomorrow.  4. The incidence of a spinal headache with nausea and/or vomiting is about 5% (one in 20 patients).  If you develop a headache, lie flat and drink plenty of fluids until the headache goes away.  Caffeinated beverages may be helpful.  If you develop severe nausea and vomiting or a headache that does not go away with flat bed rest, call [phone].  5. You may resume normal activities after your 24 hours of bed rest is over; however, do not exert yourself strongly or do any heavy lifting tomorrow.  6. Call your physician for a follow-up appointment.   You may resume Trazodone and Zoloft on Friday, February 22, 2016 after 10:30a.m.

## 2016-02-25 ENCOUNTER — Telehealth: Payer: Self-pay

## 2016-02-25 NOTE — Telephone Encounter (Signed)
Spoke with patient after his myelogram 02/21/16.He reports having a slight headache along with some "wooziness" when he first gets up from a reclining position.  He believes it's from the medications we had him stop for the myelogram.  He resumed taking them after the procedure earlier than instructed because he was feeling bad not taking them.  He says he is okay when he is up after "a while."  jkl

## 2016-04-07 ENCOUNTER — Encounter (HOSPITAL_COMMUNITY): Payer: Self-pay | Admitting: Emergency Medicine

## 2016-04-07 ENCOUNTER — Emergency Department (HOSPITAL_COMMUNITY): Payer: No Typology Code available for payment source

## 2016-04-07 ENCOUNTER — Emergency Department (HOSPITAL_COMMUNITY)
Admission: EM | Admit: 2016-04-07 | Discharge: 2016-04-07 | Disposition: A | Discharge status: Home / Self Care | Payer: No Typology Code available for payment source | Attending: Emergency Medicine | Admitting: Emergency Medicine

## 2016-04-07 DIAGNOSIS — Z79899 Other long term (current) drug therapy: Secondary | ICD-10-CM | POA: Insufficient documentation

## 2016-04-07 DIAGNOSIS — Y92410 Unspecified street and highway as the place of occurrence of the external cause: Secondary | ICD-10-CM | POA: Diagnosis not present

## 2016-04-07 DIAGNOSIS — Y939 Activity, unspecified: Secondary | ICD-10-CM | POA: Insufficient documentation

## 2016-04-07 DIAGNOSIS — Z7982 Long term (current) use of aspirin: Secondary | ICD-10-CM | POA: Insufficient documentation

## 2016-04-07 DIAGNOSIS — I10 Essential (primary) hypertension: Secondary | ICD-10-CM | POA: Insufficient documentation

## 2016-04-07 DIAGNOSIS — Y999 Unspecified external cause status: Secondary | ICD-10-CM | POA: Diagnosis not present

## 2016-04-07 DIAGNOSIS — M545 Low back pain: Secondary | ICD-10-CM | POA: Insufficient documentation

## 2016-04-07 MED ORDER — NAPROXEN 375 MG PO TABS: 375.0000 mg | ORAL_TABLET | Freq: Two times a day (BID) | ORAL | 0 refills | Status: DC

## 2016-04-07 MED ORDER — CYCLOBENZAPRINE HCL 10 MG PO TABS: 10.0000 mg | ORAL_TABLET | Freq: Two times a day (BID) | ORAL | 0 refills | Status: DC | PRN

## 2016-04-07 MED ORDER — CYCLOBENZAPRINE HCL 10 MG PO TABS
10.0000 mg | ORAL_TABLET | Freq: Once | ORAL | Status: AC
  Administered 2016-04-07: 10 mg via ORAL
  Filled 2016-04-07: qty 1
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
## 2016-10-05 ENCOUNTER — Encounter (HOSPITAL_COMMUNITY): Payer: Self-pay

## 2016-10-05 ENCOUNTER — Emergency Department (HOSPITAL_COMMUNITY)
Admission: EM | Admit: 2016-10-05 | Discharge: 2016-10-05 | Disposition: A | Discharge status: Home / Self Care | Payer: Medicare Other | Attending: Emergency Medicine | Admitting: Emergency Medicine

## 2016-10-05 DIAGNOSIS — Z79899 Other long term (current) drug therapy: Secondary | ICD-10-CM | POA: Insufficient documentation

## 2016-10-05 DIAGNOSIS — Z7982 Long term (current) use of aspirin: Secondary | ICD-10-CM | POA: Diagnosis not present

## 2016-10-05 DIAGNOSIS — I10 Essential (primary) hypertension: Secondary | ICD-10-CM | POA: Insufficient documentation

## 2016-10-05 DIAGNOSIS — M545 Low back pain: Secondary | ICD-10-CM | POA: Diagnosis present

## 2016-10-05 DIAGNOSIS — M5432 Sciatica, left side: Secondary | ICD-10-CM | POA: Insufficient documentation

## 2016-10-05 MED ORDER — DEXAMETHASONE SODIUM PHOSPHATE 10 MG/ML IJ SOLN
10.0000 mg | Freq: Once | INTRAMUSCULAR | Status: AC
  Administered 2016-10-05: 10 mg via INTRAMUSCULAR
  Filled 2016-10-05: qty 1

## 2016-10-05 NOTE — ED Triage Notes (Signed)
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  Pt takes pain meds.  In last 2 days, pain has increased to back and leg.

## 2016-10-05 NOTE — ED Provider Notes (Signed)
[facility]-EMERGENCY DEPT Provider Note   CSN: [id number] Arrival date & time: 10/05/16  4098     History   Chief Complaint Chief Complaint  Patient presents with  . Back Pain  . Leg Pain    HPI ARAN MENNING is a 61 y.o. male.  Patient is a 61 year old male with a history of chronic back pain. He states he was working in his garden yesterday and exacerbated his pain. His pain is in his left lower back radiating down his left lateral thigh. This is typical type flareup for him. He denies any numbness or weakness in the leg currently. He denies any loss of bowel or bladder control. No falls or other recent injuries. He normally takes oxycodone at home for pain but did not take any this morning. He's followed by [facility] orthopedics for these symptoms. He states he recently had a MRI in December of last year      Past Medical History:  Diagnosis Date  . Back pain   . Cardiac defibrillator in place   . Cluster headaches   . Hypertension     There are no active problems to display for this patient.   Past Surgical History:  Procedure Laterality Date  . HYDROCELE EXCISION / REPAIR    . ROTATOR CUFF REPAIR Right 04/2012       Home Medications    Prior to Admission medications   Medication Sig Start Date End Date Taking? Authorizing Provider  allopurinol (ZYLOPRIM) 100 MG tablet Take 100 mg by mouth daily.     [provider]  aspirin 81 MG tablet Take 81 mg by mouth daily.    [provider]  Aspirin-Acetaminophen-Caffeine (GOODY HEADACHE PO) Take 1 packet by mouth daily as needed (for pain).    [provider]  atorvastatin (LIPITOR) 80 MG tablet 40 mg.    [provider]  carvedilol (COREG) 6.25 MG tablet Take 3.125 mg by mouth 2 (two) times daily with a meal.    [provider]  cyclobenzaprine (FLEXERIL) 10 MG tablet Take 1 tablet (10 mg total) by mouth 2 (two) times daily as needed for muscle spasms. 04/07/16   Felicie Morn, NP  diclofenac (VOLTAREN) 75 MG EC tablet Take 75 mg by mouth daily as needed for mild pain.     [provider]  furosemide (LASIX) 40 MG tablet Take 1 tablet (40 mg total) by mouth daily. 05/05/14   Piepenbrink, Victorino Dike, PA-C  gabapentin (NEURONTIN) 300 MG capsule Take 300 mg by mouth.    [provider]  indomethacin (INDOCIN) 25 MG capsule Take 25 mg by mouth.    [provider]  isosorbide dinitrate (ISORDIL) 30 MG tablet Take 30 mg by mouth at bedtime.    [provider]  isosorbide mononitrate (IMDUR) 60 MG 24 hr tablet Take 60 mg by mouth.    [provider]  LORazepam (ATIVAN) 0.5 MG tablet Take 0.5 mg by mouth every 6 (six) hours as needed for anxiety.    [provider]  losartan (COZAAR) 25 MG tablet Take 25 mg by mouth.    [provider]  methocarbamol (ROBAXIN) 500 MG tablet Take 2 tablets (1,000 mg total) by mouth every 8 (eight) hours as needed for muscle spasms. 01/17/16   Loren Racer, MD  naproxen (NAPROSYN) 375 MG tablet Take 1 tablet (375 mg total) by mouth 2 (two) times daily. 04/07/16   Felicie Morn, NP  nitroGLYCERIN (NITROSTAT) 0.4 MG SL tablet Place  0.4 mg under the tongue.    [provider]  omeprazole (PRILOSEC) 20 MG capsule Take 20 mg by mouth.    [provider]  oxyCODONE (OXY IR/ROXICODONE) 5 MG immediate release tablet Take 10 mg by mouth.    [provider]  potassium chloride SA (K-DUR,KLOR-CON) 20 MEQ tablet Take by mouth.    [provider]  ranitidine (ZANTAC) 150 MG capsule Take 150 mg by mouth 2 (two) times daily.    [provider]  sertraline (ZOLOFT) 100 MG tablet Take 100 mg by mouth daily.    [provider]  spironolactone (ALDACTONE) 25 MG tablet Take 25 mg by mouth.    [provider]  traZODone (DESYREL) 100 MG tablet Take 100 mg by mouth at bedtime as needed for sleep.     [provider]    Family  History History reviewed. No pertinent family history.  Social History Social History  Substance Use Topics  . Smoking status: Never Smoker  . Smokeless tobacco: Never Used  . Alcohol use Yes     Comment: socially     Allergies   Propranolol   Review of Systems Review of Systems  Constitutional: Negative for fever.  Gastrointestinal: Negative for nausea and vomiting.  Musculoskeletal: Positive for back pain. Negative for arthralgias, joint swelling and neck pain.  Skin: Negative for wound.  Neurological: Negative for weakness, numbness and headaches.     Physical Exam Updated Vital Signs BP 125/82 (BP Location: Left Arm)   Pulse 96   Temp 98.5 F (36.9 C) (Oral)   Resp 18   Ht 6' 1.5" (1.867 m)   Wt 111.6 kg (246 lb)   SpO2 99%   BMI 32.02 kg/m   Physical Exam  Constitutional: He is oriented to person, place, and time. He appears well-developed and well-nourished.  HENT:  Head: Normocephalic and atraumatic.  Neck: Normal range of motion. Neck supple.  Cardiovascular: Normal rate.   Pulmonary/Chest: Effort normal.  Musculoskeletal: He exhibits no edema or tenderness.  Patient has pain down the left lumbar paraspinal area. There is pain over the left lateral side. There is no pain on range of motion of the hip. Negative straight leg raise. He has normal motor function and sensation in the lower extremity. Patellar reflexes intact. Pedal pulses are intact.  Neurological: He is alert and oriented to person, place, and time.  Skin: Skin is warm and dry.  Psychiatric: He has a normal mood and affect.     ED Treatments / Results  Labs (all labs ordered are listed, but only abnormal results are displayed) Labs Reviewed - No data to display  EKG  EKG Interpretation None       Radiology No results found.  Procedures Procedures (including critical care time)  Medications Ordered in ED Medications  dexamethasone (DECADRON) injection 10 mg (not  administered)     Initial Impression / Assessment and Plan / ED Course  I have reviewed the triage vital signs and the nursing notes.  Pertinent labs & imaging results that were available during my care of the patient were reviewed by me and considered in my medical decision making (see chart for details).     Patient presents with worsening of his chronic back pain. He has no suggestions of cauda equina. No recent trauma. He did not take his home medication this morning. I did advise him that we can give him an injection of some pain medication wise here but he  does not have a ride home. I advised him if he can find a ride home and she gave him an injection. Otherwise we'll give him an injection of Decadron. He is recently diagnosed diabetic but his blood sugar was 87 this morning. I advised him that he'll need to keep a closer eye on his blood sugars since he got a steroid injection. He was encouraged to follow-up with his orthopedist at [facility]. Return precautions were given.  Final Clinical Impressions(s) / ED Diagnoses   Final diagnoses:  Sciatica of left side    New Prescriptions New Prescriptions   No medications on file     Rolan Bucco, MD 10/05/16 [phone]
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
## 2017-02-02 ENCOUNTER — Emergency Department (HOSPITAL_COMMUNITY)
Admission: EM | Admit: 2017-02-02 | Discharge: 2017-02-02 | Disposition: A | Discharge status: Home / Self Care | Payer: Medicare Other | Attending: Emergency Medicine | Admitting: Emergency Medicine

## 2017-02-02 ENCOUNTER — Encounter (HOSPITAL_COMMUNITY): Payer: Self-pay

## 2017-02-02 ENCOUNTER — Emergency Department (HOSPITAL_COMMUNITY): Payer: Medicare Other

## 2017-02-02 DIAGNOSIS — I10 Essential (primary) hypertension: Secondary | ICD-10-CM | POA: Insufficient documentation

## 2017-02-02 DIAGNOSIS — Z79899 Other long term (current) drug therapy: Secondary | ICD-10-CM | POA: Diagnosis not present

## 2017-02-02 DIAGNOSIS — R197 Diarrhea, unspecified: Secondary | ICD-10-CM | POA: Diagnosis not present

## 2017-02-02 DIAGNOSIS — E162 Hypoglycemia, unspecified: Secondary | ICD-10-CM

## 2017-02-02 DIAGNOSIS — Z7982 Long term (current) use of aspirin: Secondary | ICD-10-CM | POA: Insufficient documentation

## 2017-02-02 DIAGNOSIS — R51 Headache: Secondary | ICD-10-CM | POA: Diagnosis present

## 2017-02-02 HISTORY — DX: Type 2 diabetes mellitus without complications: E11.9

## 2017-02-02 LAB — CBC WITH DIFFERENTIAL/PLATELET
BASOS PCT: 0 %
Basophils Absolute: 0 10*3/uL (ref 0.0–0.1)
EOS ABS: 0.1 10*3/uL (ref 0.0–0.7)
Eosinophils Relative: 2 %
HCT: 44.8 % (ref 39.0–52.0)
HEMOGLOBIN: 14.9 g/dL (ref 13.0–17.0)
Lymphocytes Relative: 48 %
Lymphs Abs: 2.5 10*3/uL (ref 0.7–4.0)
MCH: 26.5 pg (ref 26.0–34.0)
MCHC: 33.3 g/dL (ref 30.0–36.0)
MCV: 79.6 fL (ref 78.0–100.0)
Monocytes Absolute: 0.3 10*3/uL (ref 0.1–1.0)
Monocytes Relative: 6 %
NEUTROS PCT: 44 %
Neutro Abs: 2.3 10*3/uL (ref 1.7–7.7)
Platelets: 210 10*3/uL (ref 150–400)
RBC: 5.63 MIL/uL (ref 4.22–5.81)
RDW: 16 % — ABNORMAL HIGH (ref 11.5–15.5)
WBC: 5.2 10*3/uL (ref 4.0–10.5)

## 2017-02-02 LAB — COMPREHENSIVE METABOLIC PANEL
ALT: 17 U/L (ref 17–63)
AST: 20 U/L (ref 15–41)
Albumin: 4 g/dL (ref 3.5–5.0)
Alkaline Phosphatase: 59 U/L (ref 38–126)
Anion gap: 6 (ref 5–15)
BUN: 16 mg/dL (ref 6–20)
CO2: 28 mmol/L (ref 22–32)
Calcium: 9.7 mg/dL (ref 8.9–10.3)
Chloride: 109 mmol/L (ref 101–111)
Creatinine, Ser: 1.16 mg/dL (ref 0.61–1.24)
GFR calc Af Amer: >60 mL/min (ref >60)
GFR calc non Af Amer: >60 mL/min (ref >60)
Glucose, Bld: 75 mg/dL (ref 65–99)
Potassium: 3.9 mmol/L (ref 3.5–5.1)
Sodium: 143 mmol/L (ref 135–145)
Total Bilirubin: 0.4 mg/dL (ref 0.3–1.2)
Total Protein: 7.3 g/dL (ref 6.5–8.1)

## 2017-02-02 LAB — CBG MONITORING, ED
Glucose-Capillary: 109 mg/dL — ABNORMAL HIGH (ref 65–99)
Glucose-Capillary: 59 mg/dL — ABNORMAL LOW (ref 65–99)
Glucose-Capillary: 120 mg/dL — ABNORMAL HIGH (ref 65–99)
Glucose-Capillary: 101 mg/dL — ABNORMAL HIGH (ref 65–99)

## 2017-02-02 LAB — TROPONIN I

## 2017-02-02 MED ORDER — SODIUM CHLORIDE 0.9 % IV BOLUS (SEPSIS)
1000.0000 mL | Freq: Once | INTRAVENOUS | Status: AC
  Administered 2017-02-02: 1000 mL via INTRAVENOUS

## 2017-02-02 NOTE — ED Provider Notes (Signed)
[facility]ERGENCY DEPT Provider Note   CSN: [id number] Arrival date & time: 02/02/17  1032     History   Chief Complaint Chief Complaint  Patient presents with  . Hypoglycemia    HPI Kristopher Scott is a 61 y.o. male.  Pt presents to the ED today with hypoglycemia this morning.  The pt has been on 36 units of insulin since July.  He said he had diarrhea yesterday and everything he ate went right through him.  The pt took his normal dose of insulin last night.  He woke up the morning feeling like he was not right.  He described it as feeling "hung over," but he did not drink last night.  The pt took his blood sugar and it was 44.  [facility] and [facility] told him to eat something sweet.  He drank OJ and ate a honey bun.  The pt said blood sugar went up, but then back down.  He then developed a headache.  He called the help line again and he was told to come to the ED.  He ate a sandwich around 1300 while waiting to be seen and feels better.      Past Medical History:  Diagnosis Date  . Back pain   . Cardiac defibrillator in place   . Cluster headaches   . Diabetes mellitus without complication (HCC)   . Hypertension     There are no active problems to display for this patient.   Past Surgical History:  Procedure Laterality Date  . HYDROCELE EXCISION / REPAIR    . ROTATOR CUFF REPAIR Right 04/2012       Home Medications    Prior to Admission medications   Medication Sig Start Date End Date Taking? Authorizing Provider  allopurinol (ZYLOPRIM) 100 MG tablet Take 100 mg by mouth daily.    Yes [provider]  aspirin 81 MG tablet Take 81 mg by mouth daily.   Yes [provider]  Aspirin-Acetaminophen-Caffeine (GOODY HEADACHE PO) Take 1 packet by mouth daily as needed (for pain).   Yes [provider]  atorvastatin (LIPITOR) 80 MG tablet Take 40 mg by mouth daily.    Yes [provider]  carvedilol (COREG) 6.25 MG  tablet Take 3.125 mg by mouth 2 (two) times daily with a meal.   Yes [provider]  clonazePAM (KLONOPIN) 2 MG tablet Take 2 mg by mouth 2 (two) times daily as needed for anxiety (sleep).   Yes [provider]  cyclobenzaprine (FLEXERIL) 10 MG tablet Take 1 tablet (10 mg total) by mouth 2 (two) times daily as needed for muscle spasms. 04/07/16  Yes Felicie Morn, NP  furosemide (LASIX) 40 MG tablet Take 1 tablet (40 mg total) by mouth daily. 05/05/14  Yes Piepenbrink, Victorino Dike, PA-C  gabapentin (NEURONTIN) 300 MG capsule Take 300 mg by mouth 2 (two) times daily as needed (pain).    Yes [provider]  indomethacin (INDOCIN) 25 MG capsule Take 25 mg by mouth 3 (three) times daily as needed for mild pain or moderate pain.    Yes [provider]  isosorbide mononitrate (IMDUR) 60 MG 24 hr tablet Take 60 mg by mouth.   Yes [provider]  losartan (COZAAR) 25 MG tablet Take 25 mg by mouth daily.    Yes [provider]  naproxen (NAPROSYN) 375 MG tablet Take 375 mg by mouth 2 (two) times daily as needed for moderate pain.  04/07/16  Yes [provider]  nitroGLYCERIN (NITROSTAT) 0.4 MG SL tablet Place 0.4 mg under the tongue every 5 (five) minutes as needed for chest pain.    Yes [provider]  oxyCODONE (OXY IR/ROXICODONE) 5 MG immediate release tablet Take 5 mg by mouth every 6 (six) hours as needed for moderate pain or severe pain.    Yes [provider]  potassium chloride SA (K-DUR,KLOR-CON) 20 MEQ tablet Take 20 mEq by mouth daily.    Yes [provider]  ranitidine (ZANTAC) 150 MG capsule Take 150 mg by mouth 2 (two) times daily.   Yes [provider]  diclofenac (VOLTAREN) 75 MG EC tablet Take 75 mg by mouth daily as needed for mild pain.     [provider]  methocarbamol (ROBAXIN) 500 MG tablet Take 2 tablets (1,000 mg total) by mouth every 8 (eight) hours as needed for muscle spasms.  01/17/16   Loren Racer, MD  naproxen (NAPROSYN) 375 MG tablet Take 1 tablet (375 mg total) by mouth 2 (two) times daily. Patient not taking: Reported on 02/02/2017 04/07/16   Felicie Morn, NP  traZODone (DESYREL) 100 MG tablet Take 100 mg by mouth at bedtime as needed for sleep.     [provider]    Family History Family History  Problem Relation Age of Onset  . Hypertension Mother   . Diabetes Mother     Social History Social History  Substance Use Topics  . Smoking status: Never Smoker  . Smokeless tobacco: Never Used  . Alcohol use Yes     Comment: socially     Allergies   Propranolol   Review of Systems Review of Systems  Endocrine:       Low blood sugar  Neurological: Positive for headaches.  All other systems reviewed and are negative.    Physical Exam Updated Vital Signs BP (!) 133/91 (BP Location: Right Arm)   Pulse 65   Temp 98.4 F (36.9 C) (Oral)   Resp 16   Ht 6' 1.5" (1.867 m)   Wt 112 kg (247 lb)   SpO2 100%   BMI 32.15 kg/m   Physical Exam  Constitutional: He is oriented to person, place, and time. He appears well-developed and well-nourished.  HENT:  Head: Normocephalic and atraumatic.  Right Ear: External ear normal.  Left Ear: External ear normal.  Nose: Nose normal.  Mouth/Throat: Oropharynx is clear and moist.  Eyes: Pupils are equal, round, and reactive to light. Conjunctivae and EOM are normal.  Neck: Normal range of motion. Neck supple.  Cardiovascular: Normal rate, regular rhythm, normal heart sounds and intact distal pulses.   Pulmonary/Chest: Effort normal and breath sounds normal.  Abdominal: Soft. Bowel sounds are normal.  Musculoskeletal: Normal range of motion.  Neurological: He is alert and oriented to person, place, and time.  Skin: Skin is warm.  Psychiatric: He has a normal mood and affect. His behavior is normal. Judgment and thought content normal.  Nursing note and vitals reviewed.    ED Treatments  / Results  Labs (all labs ordered are listed, but only abnormal results are displayed) Labs Reviewed  CBC WITH DIFFERENTIAL/PLATELET - Abnormal; Notable for the following:       Result Value   RDW 16.0 (*)    All other components within normal limits  CBG MONITORING, ED - Abnormal; Notable for the following:    Glucose-Capillary 109 (*)    All other components within normal limits  CBG  MONITORING, ED - Abnormal; Notable for the following:    Glucose-Capillary 59 (*)    All other components within normal limits  CBG MONITORING, ED - Abnormal; Notable for the following:    Glucose-Capillary 120 (*)    All other components within normal limits  CBG MONITORING, ED - Abnormal; Notable for the following:    Glucose-Capillary 101 (*)    All other components within normal limits  COMPREHENSIVE METABOLIC PANEL  TROPONIN I  URINALYSIS, ROUTINE W REFLEX MICROSCOPIC    EKG  EKG Interpretation None       Radiology Dg Chest 2 View  Result Date: 02/02/2017 CLINICAL DATA:  Hypoglycemia EXAM: CHEST  2 VIEW COMPARISON:  01/16/2016 chest radiograph. FINDINGS: Stable configuration of 3 lead left subclavian ICD. Stable cardiomediastinal silhouette with normal heart size. No pneumothorax. No pleural effusion. Lungs appear clear, with no acute consolidative airspace disease and no pulmonary edema. IMPRESSION: No active cardiopulmonary disease. Electronically Signed   By: Delbert Phenix M.D.   On: 02/02/2017 16:43    Procedures Procedures (including critical care time)  Medications Ordered in ED Medications  sodium chloride 0.9 % bolus 1,000 mL (1,000 mLs Intravenous New Bag/Given 02/02/17 1618)     Initial Impression / Assessment and Plan / ED Course  I have reviewed the triage vital signs and the nursing notes.  Pertinent labs & imaging results that were available during my care of the patient were reviewed by me and considered in my medical decision making (see chart for details).    Pt  has been here for hours and has remained stable.  He is told to eat high protein meals.  [facility]  Return if worse.  Final Clinical Impressions(s) / ED Diagnoses   Final diagnoses:  Hypoglycemia    New Prescriptions New Prescriptions   No medications on file     Jacalyn Lefevre, MD 02/02/17 [phone]

## 2017-02-02 NOTE — ED Notes (Signed)
Bed: WA01 Expected date:  Expected time:  Means of arrival:  Comments: tri

## 2017-02-02 NOTE — Discharge Instructions (Signed)
Hold your insulin tonight.  Eat a high protein meal when you get home and a high protein snack before you go to bed.

## 2017-04-16 ENCOUNTER — Ambulatory Visit: Payer: Self-pay | Admitting: Podiatry

## 2017-04-17 ENCOUNTER — Ambulatory Visit (INDEPENDENT_AMBULATORY_CARE_PROVIDER_SITE_OTHER): Payer: Medicare Other | Admitting: Podiatry

## 2017-04-17 ENCOUNTER — Encounter: Payer: Self-pay | Admitting: Podiatry

## 2017-04-17 DIAGNOSIS — B351 Tinea unguium: Secondary | ICD-10-CM

## 2017-04-17 DIAGNOSIS — E1142 Type 2 diabetes mellitus with diabetic polyneuropathy: Secondary | ICD-10-CM | POA: Diagnosis not present

## 2017-04-17 DIAGNOSIS — G4730 Sleep apnea, unspecified: Secondary | ICD-10-CM | POA: Insufficient documentation

## 2017-04-17 DIAGNOSIS — M792 Neuralgia and neuritis, unspecified: Secondary | ICD-10-CM

## 2017-04-17 DIAGNOSIS — Z950 Presence of cardiac pacemaker: Secondary | ICD-10-CM | POA: Insufficient documentation

## 2017-04-17 NOTE — Progress Notes (Signed)
  Subjective:  Patient ID: Kristopher Scott, male    DOB: Sep 05, 1955,  MRN: [id number]  Chief Complaint  Patient presents with  . Diabetes    Dx in April 2018  . Callouses    preulcerative callouses  . Nail Problem    elongated thickened toenails   61 y.o. male returns for diabetic foot care. Unknown last AMBS. Reports numbness and tingling in their feet. Denies cramping in legs and thighs.  Objective:  There were no vitals filed for this visit. General AA&O x3. Normal mood and affect.  Vascular Dorsalis pedis pulses present 2+ bilaterally  Posterior tibial pulses present 2+ bilaterally  Capillary refill normal to all digits. Pedal hair growth diminished.  Neurologic Epicritic sensation present bilaterally. Protective sensation with 5.07 monofilament  diminished L, intact R. Vibratory sensation diminished L, intact R.  Dermatologic No open lesions. Interspaces clear of maceration.  Normal skin temperature and turgor. Hyperkeratotic lesions: none bilaterally. Nails: Elongated and thickened with subungual debris.  Brown black discoloration.  Trophic changes  Orthopedic: No history of amputation. MMT 5/5 in dorsiflexion, plantarflexion, inversion, and eversion. Normal lower extremity joint ROM without pain or crepitus.   Assessment & Plan:  Patient was evaluated and treated and all questions answered.  Diabetes with Neeuropathy, Onychomycosis -Educated on diabetic footcare. Diabetic risk level 1 -Discussed that patient has neuropathy to his L side likely from a low back issue. Encouraged PCP f/u. -At risk foot care provided as below.  Procedure: Nail Debridement Rationale: Patient meets criteria for routine foot care due to Class C findings. Type of Debridement: manual, sharp debridement. Instrumentation: Nail nipper, rotary burr. Number of Nails: 10  Return in about 3 months (around 07/16/2017) for Diabetic Foot Care.

## 2017-04-19 ENCOUNTER — Encounter: Payer: Self-pay | Admitting: Podiatry

## 2017-07-17 ENCOUNTER — Ambulatory Visit (INDEPENDENT_AMBULATORY_CARE_PROVIDER_SITE_OTHER): Payer: Medicare PPO | Admitting: Podiatry

## 2017-07-17 DIAGNOSIS — E1142 Type 2 diabetes mellitus with diabetic polyneuropathy: Secondary | ICD-10-CM

## 2017-07-17 DIAGNOSIS — B351 Tinea unguium: Secondary | ICD-10-CM

## 2017-08-01 NOTE — Progress Notes (Signed)
  Subjective:  Patient ID: Kristopher Scott, male    DOB: [birth year],  MRN: [id number]  No chief complaint on file.  62 y.o. male returns for diabetic foot care. Unknown last AMBS.  Patient reports gout flare up last night. Reports numbness and tingling in their feet. Denies cramping in legs and thighs.  Objective:  There were no vitals filed for this visit. General AA&O x3. Normal mood and affect.  Vascular Dorsalis pedis pulses present 2+ bilaterally  Posterior tibial pulses present 2+ bilaterally  Capillary refill normal to all digits. Pedal hair growth diminished.  Neurologic Epicritic sensation present bilaterally. Protective sensation with 5.07 monofilament  diminished L, intact R. Vibratory sensation diminished L, intact R.  Dermatologic No open lesions. Interspaces clear of maceration.  Normal skin temperature and turgor. Hyperkeratotic lesions: none bilaterally. Nails: Elongated and thickened with subungual debris.  Brown black discoloration.  Trophic changes  Orthopedic: No history of amputation. MMT 5/5 in dorsiflexion, plantarflexion, inversion, and eversion. Normal lower extremity joint ROM without pain or crepitus.   Assessment & Plan:  Patient was evaluated and treated and all questions answered.  Diabetes with Neeuropathy, Onychomycosis -Educated on diabetic footcare. Diabetic risk level 1 -At risk foot care provided as below.  Procedure: Nail Debridement Rationale: Patient meets criteria for routine foot care due to Class C findings. Type of Debridement: manual, sharp debridement. Instrumentation: Nail nipper, rotary burr. Number of Nails: 10  No follow-ups on file.

## 2017-09-10 ENCOUNTER — Emergency Department (HOSPITAL_COMMUNITY): Payer: Medicare PPO

## 2017-09-10 ENCOUNTER — Other Ambulatory Visit: Payer: Self-pay

## 2017-09-10 ENCOUNTER — Emergency Department (HOSPITAL_COMMUNITY)
Admission: EM | Admit: 2017-09-10 | Discharge: 2017-09-11 | Disposition: A | Discharge status: Home / Self Care | Payer: Medicare PPO | Attending: Emergency Medicine | Admitting: Emergency Medicine

## 2017-09-10 ENCOUNTER — Encounter (HOSPITAL_COMMUNITY): Payer: Self-pay

## 2017-09-10 DIAGNOSIS — E119 Type 2 diabetes mellitus without complications: Secondary | ICD-10-CM | POA: Insufficient documentation

## 2017-09-10 DIAGNOSIS — R0789 Other chest pain: Secondary | ICD-10-CM

## 2017-09-10 DIAGNOSIS — Z79899 Other long term (current) drug therapy: Secondary | ICD-10-CM | POA: Diagnosis not present

## 2017-09-10 DIAGNOSIS — Z7982 Long term (current) use of aspirin: Secondary | ICD-10-CM | POA: Diagnosis not present

## 2017-09-10 DIAGNOSIS — I10 Essential (primary) hypertension: Secondary | ICD-10-CM | POA: Insufficient documentation

## 2017-09-10 DIAGNOSIS — R079 Chest pain, unspecified: Secondary | ICD-10-CM | POA: Diagnosis present

## 2017-09-10 DIAGNOSIS — Z950 Presence of cardiac pacemaker: Secondary | ICD-10-CM | POA: Insufficient documentation

## 2017-09-10 DIAGNOSIS — R0602 Shortness of breath: Secondary | ICD-10-CM | POA: Insufficient documentation

## 2017-09-10 LAB — BASIC METABOLIC PANEL
ANION GAP: 9 (ref 5–15)
BUN: 10 mg/dL (ref 6–20)
CHLORIDE: 110 mmol/L (ref 101–111)
CO2: 23 mmol/L (ref 22–32)
Calcium: 9.5 mg/dL (ref 8.9–10.3)
Creatinine, Ser: 1.19 mg/dL (ref 0.61–1.24)
GFR calc non Af Amer: >60 mL/min (ref >60)
GLUCOSE: 140 mg/dL — AB (ref 65–99)
Potassium: 3.7 mmol/L (ref 3.5–5.1)
Sodium: 142 mmol/L (ref 135–145)

## 2017-09-10 LAB — CBC
HCT: 44.7 % (ref 39.0–52.0)
HEMOGLOBIN: 14.8 g/dL (ref 13.0–17.0)
MCH: 27.4 pg (ref 26.0–34.0)
MCHC: 33.1 g/dL (ref 30.0–36.0)
MCV: 82.8 fL (ref 78.0–100.0)
Platelets: 180 10*3/uL (ref 150–400)
RBC: 5.4 MIL/uL (ref 4.22–5.81)
RDW: 15.9 % — ABNORMAL HIGH (ref 11.5–15.5)
WBC: 4.2 10*3/uL (ref 4.0–10.5)

## 2017-09-10 LAB — I-STAT TROPONIN, ED
Troponin i, poc: 0.01 ng/mL (ref 0.00–0.08)
Troponin i, poc: 0.01 ng/mL (ref 0.00–0.08)

## 2017-09-10 MED ORDER — OXYCODONE-ACETAMINOPHEN 5-325 MG PO TABS
1.0000 | ORAL_TABLET | Freq: Once | ORAL | Status: AC
  Administered 2017-09-10: 1 via ORAL
  Filled 2017-09-10: qty 1

## 2017-09-10 NOTE — ED Triage Notes (Signed)
Pt reports CP SOB X2 days. Pt states he has had increasing stress and anxiety for the past 2 weeks. Pt speaking in short sentences in triage states pain states in chest and radiates to both sides of chest and to back.

## 2017-09-10 NOTE — ED Provider Notes (Signed)
[facility] EMERGENCY DEPARTMENT Provider Note   CSN: [id number] Arrival date & time: 09/10/17  1522     History   Chief Complaint Chief Complaint  Patient presents with  . Chest Pain  . Shortness of Breath    HPI VARNER CANCILLA is a 62 y.o. male.  Patient with a history of DM, HTN, pacer/defibrillator, CHF presents for evaluation of chest pain and SOB. He noticed some mild dyspnea yesterday while doing light yard work. Today he had onset of chest discomfort that has been constant through the day. No cough or fever. No nausea, vomiting, diarrhea or diaphoresis. The chest pain is located in the center of his chest and radiates to between the shoulder blades. It is worse with movement but is not affected by exertion. The SoB is unchanged from yesterday. He denies any history of ischemic heart disease and reports a clear cardiac catheterization in 2016 just prior to his pacemaker placement.   The history is provided by the patient. No language interpreter was used.  Chest Pain   Associated symptoms include back pain (Chest pain through to back) and shortness of breath. Pertinent negatives include no abdominal pain, no cough, no diaphoresis, no fever and no nausea.  Shortness of Breath  Associated symptoms include chest pain. Pertinent negatives include no fever, no cough and no abdominal pain.    Past Medical History:  Diagnosis Date  . Back pain   . Cardiac defibrillator in place   . Cluster headaches   . Diabetes mellitus without complication (HCC)   . Hypertension     Patient Active Problem List   Diagnosis Date Noted  . Pacemaker 04/17/2017  . Sleep apnea 04/17/2017  . Rotator cuff tear 01/23/2012    Past Surgical History:  Procedure Laterality Date  . HYDROCELE EXCISION / REPAIR    . ROTATOR CUFF REPAIR Right 04/2012        Home Medications    Prior to Admission medications   Medication Sig Start Date End Date Taking? Authorizing Provider   allopurinol (ZYLOPRIM) 100 MG tablet Take 100 mg by mouth daily.    Yes [provider]  aspirin 81 MG tablet Take 81 mg by mouth daily.   Yes [provider]  atorvastatin (LIPITOR) 80 MG tablet Take 40 mg by mouth daily.    Yes [provider]  carvedilol (COREG) 6.25 MG tablet Take 3.125 mg by mouth 2 (two) times daily with a meal.   Yes [provider]  clonazePAM (KLONOPIN) 2 MG tablet Take 2 mg by mouth 2 (two) times daily as needed for anxiety (sleep).   Yes [provider]  cyclobenzaprine (FLEXERIL) 10 MG tablet Take 1 tablet (10 mg total) by mouth 2 (two) times daily as needed for muscle spasms. 04/07/16  Yes Felicie Morn, NP  furosemide (LASIX) 40 MG tablet Take 1 tablet (40 mg total) by mouth daily. 05/05/14  Yes Piepenbrink, Victorino Dike, PA-C  gabapentin (NEURONTIN) 300 MG capsule Take 300 mg by mouth 2 (two) times daily as needed (pain).    Yes [provider]  isosorbide mononitrate (IMDUR) 60 MG 24 hr tablet Take 60 mg by mouth.   Yes [provider]  losartan (COZAAR) 25 MG tablet Take 25 mg by mouth daily.    Yes [provider]  nitroGLYCERIN (NITROSTAT) 0.4 MG SL tablet Place 0.4 mg under the tongue every 5 (five) minutes as needed for chest pain.    Yes [provider]  oxyCODONE (OXY IR/ROXICODONE) 5 MG immediate release tablet Take 5 mg by mouth every 6 (six) hours as needed for moderate pain or severe pain.    Yes [provider]  traZODone (DESYREL) 100 MG tablet Take 100 mg by mouth at bedtime as needed for sleep.    Yes [provider]  methocarbamol (ROBAXIN) 500 MG tablet Take 2 tablets (1,000 mg total) by mouth every 8 (eight) hours as needed for muscle spasms. 01/17/16   Loren Racer, MD  naproxen (NAPROSYN) 375 MG tablet Take 1 tablet (375 mg total) by mouth 2 (two) times daily. Patient not taking: Reported on 02/02/2017 04/07/16   Felicie Morn, NP    Family  History Family History  Problem Relation Age of Onset  . Hypertension Mother   . Diabetes Mother     Social History Social History   Tobacco Use  . Smoking status: Never Smoker  . Smokeless tobacco: Never Used  Substance Use Topics  . Alcohol use: Yes    Comment: socially  . Drug use: No     Allergies   Propranolol   Review of Systems Review of Systems  Constitutional: Negative for chills, diaphoresis and fever.  HENT: Negative.   Respiratory: Positive for shortness of breath. Negative for cough.   Cardiovascular: Positive for chest pain.  Gastrointestinal: Negative.  Negative for abdominal pain and nausea.  Musculoskeletal: Positive for back pain (Chest pain through to back).  Skin: Negative.   Neurological: Negative.      Physical Exam Updated Vital Signs BP 140/85   Pulse (!) 51   Temp 97.7 F (36.5 C) (Oral)   Resp 12   Ht 6\' 1"  (1.854 m)   Wt 106.1 kg (234 lb)   SpO2 100%   BMI 30.87 kg/m   Physical Exam  Constitutional: He is oriented to person, place, and time. He appears well-developed and well-nourished.  HENT:  Head: Normocephalic.  Neck: Normal range of motion. Neck supple. Carotid bruit is not present.  Cardiovascular: Normal rate and regular rhythm.  Pulmonary/Chest: Effort normal and breath sounds normal. He has no wheezes. He has no rales. He exhibits tenderness (Chest is TTP in sternal area).  Abdominal: Soft. Bowel sounds are normal. There is no tenderness. There is no rebound and no guarding.  Musculoskeletal: Normal range of motion. He exhibits no edema.  Neurological: He is alert and oriented to person, place, and time.  Skin: Skin is warm and dry. No rash noted.  Psychiatric: He has a normal mood and affect.     ED Treatments / Results  Labs (all labs ordered are listed, but only abnormal results are displayed) Labs Reviewed  BASIC METABOLIC PANEL - Abnormal; Notable for the following components:      Result Value   Glucose,  Bld 140 (*)    All other components within normal limits  CBC - Abnormal; Notable for the following components:   RDW 15.9 (*)    All other components within normal limits  I-STAT TROPONIN, ED  I-STAT TROPONIN, ED    EKG EKG Interpretation  Date/Time:  Thursday Sep 10 2017 15:27:41 EDT Ventricular Rate:  66 PR Interval:  162 QRS Duration: 142 QT Interval:  428 QTC Calculation: 448 R Axis:   3 Text Interpretation:  Atrial-sensed ventricular-paced rhythm Abnormal ECG No significant change since last tracing Confirmed by Melene Plan [phone]) on 09/10/2017 11:59:39 PM   Radiology Dg Chest 2 View  Result Date: 09/10/2017 CLINICAL DATA:  Pt c/o chest  pain between his shoulder blades, SOB, and cough x 2 days. Hx of DM AND HTN. Pt is a nonsmoker. EXAM: CHEST - 2 VIEW COMPARISON:  02/02/2017 FINDINGS: Cardiac silhouette is mildly enlarged. Left anterior chest wall biventricular cardioverter-defibrillator is stable. Aorta is prominent and uncoiled. No mediastinal or hilar masses. No convincing adenopathy. Clear lungs.  No pleural effusion or pneumothorax. Skeletal structures are intact. IMPRESSION: No acute cardiopulmonary disease. Electronically Signed   By: Amie Portland M.D.   On: 09/10/2017 16:46    Procedures Procedures (including critical care time)  Medications Ordered in ED Medications  oxyCODONE-acetaminophen (PERCOCET/ROXICET) 5-325 MG per tablet 1 tablet (1 tablet Oral Given 09/10/17 2300)     Initial Impression / Assessment and Plan / ED Course  I have reviewed the triage vital signs and the nursing notes.  Pertinent labs & imaging results that were available during my care of the patient were reviewed by me and considered in my medical decision making (see chart for details).     Patient here with c/o chest pain, starting today, constant. No history of ischemia. Pain addressed with Percocet x 1.  He has a Interior and spatial designer that shows 'no device or lead  performance issues observed'. Troponin negative x 2. Pain is atypical for ischemic chest pain as it is constant and reproducible. EKG unchanged. No risk factors for PE, no tachycardia, hypoxia. Doubt PE. Pain does not follow pattern of dissection. CXR clear showing no infection or pulmonary abnormality as possible source of pain.  Suspect chest wall pain.   Discussed with Dr. Adela Lank who agrees the patient is stable for discharge home. Encourage PCP follow up.   Final Clinical Impressions(s) / ED Diagnoses   Final diagnoses:  None   1. Chest wall pain  ED Discharge Orders    None       Elpidio Anis, PA-C 09/11/17 0007    Melene Plan, DO 09/11/17 [phone]

## 2017-09-11 MED ORDER — OXYCODONE-ACETAMINOPHEN 5-325 MG PO TABS: 1.0000 | ORAL_TABLET | ORAL | 0 refills | Status: DC | PRN

## 2017-09-11 NOTE — Discharge Instructions (Addendum)
Your lab, x-ray and EKG test results are all normal. Your pacer/defib interrogation results do not show any performance issues. You can be discharged home and will need to see your doctor for recheck in 2-3 days. Return here with any new or concerning symptoms.  Results for orders placed or performed during the hospital encounter of 09/10/17  Basic metabolic panel  Result Value Ref Range   Sodium 142 135 - 145 mmol/L   Potassium 3.7 3.5 - 5.1 mmol/L   Chloride 110 101 - 111 mmol/L   CO2 23 22 - 32 mmol/L   Glucose, Bld 140 (H) 65 - 99 mg/dL   BUN 10 6 - 20 mg/dL   Creatinine, Ser 1.77 0.61 - 1.24 mg/dL   Calcium 9.5 8.9 - 11.6 mg/dL   GFR calc non Af Amer >60 >60 mL/min   GFR calc Af Amer >60 >60 mL/min   Anion gap 9 5 - 15  CBC  Result Value Ref Range   WBC 4.2 4.0 - 10.5 K/uL   RBC 5.40 4.22 - 5.81 MIL/uL   Hemoglobin 14.8 13.0 - 17.0 g/dL   HCT 57.9 03.8 - 33.3 %   MCV 82.8 78.0 - 100.0 fL   MCH 27.4 26.0 - 34.0 pg   MCHC 33.1 30.0 - 36.0 g/dL   RDW 83.2 (H) 91.9 - 16.6 %   Platelets 180 150 - 400 K/uL  I-stat troponin, ED  Result Value Ref Range   Troponin i, poc 0.01 0.00 - 0.08 ng/mL   Comment 3          I-Stat Troponin, ED (not at [facility])  Result Value Ref Range   Troponin i, poc 0.01 0.00 - 0.08 ng/mL   Comment 3

## 2017-10-16 ENCOUNTER — Ambulatory Visit: Payer: Non-veteran care | Admitting: Podiatry

## 2017-10-22 ENCOUNTER — Ambulatory Visit (INDEPENDENT_AMBULATORY_CARE_PROVIDER_SITE_OTHER): Payer: Medicare PPO | Admitting: Podiatry

## 2017-10-22 DIAGNOSIS — B351 Tinea unguium: Secondary | ICD-10-CM

## 2017-10-22 DIAGNOSIS — E1142 Type 2 diabetes mellitus with diabetic polyneuropathy: Secondary | ICD-10-CM | POA: Diagnosis not present

## 2017-10-25 NOTE — Progress Notes (Signed)
  Subjective:  Patient ID: Kristopher Scott, male    DOB: [birth year],  MRN: [id number]  Chief Complaint  Patient presents with  . Diabetes    3 month nail trim   62 y.o. male returns for diabetic foot care. 127 last AMBS.   Reports numbness and tingling in their feet. Denies cramping in legs and thighs.  Objective:  There were no vitals filed for this visit. General AA&O x3. Normal mood and affect.  Vascular Dorsalis pedis pulses present 2+ bilaterally  Posterior tibial pulses present 2+ bilaterally  Capillary refill normal to all digits. Pedal hair growth diminished.  Neurologic Epicritic sensation present bilaterally. Protective sensation with 5.07 monofilament  diminished L, intact R. Vibratory sensation diminished L, intact R.  Dermatologic No open lesions. Interspaces clear of maceration.  Normal skin temperature and turgor. Hyperkeratotic lesions: none bilaterally. Nails: Elongated and thickened with subungual debris.  Brown black discoloration.  Trophic changes  Orthopedic: No history of amputation. MMT 5/5 in dorsiflexion, plantarflexion, inversion, and eversion. Normal lower extremity joint ROM without pain or crepitus.   Assessment & Plan:  Patient was evaluated and treated and all questions answered.  Diabetes with Neeuropathy, Onychomycosis -Educated on diabetic footcare. Diabetic risk level 1 -At risk foot care provided as below.  Procedure: Nail Debridement Rationale: Patient meets criteria for routine foot care due to Class C findings. Type of Debridement: manual, sharp debridement. Instrumentation: Nail nipper, rotary burr. Number of Nails: 10  Return in about 3 months (around 01/22/2018) for Diabetic Foot Care.

## 2018-01-28 ENCOUNTER — Ambulatory Visit (INDEPENDENT_AMBULATORY_CARE_PROVIDER_SITE_OTHER): Payer: Medicare PPO | Admitting: Podiatry

## 2018-01-28 ENCOUNTER — Encounter: Payer: Self-pay | Admitting: Podiatry

## 2018-01-28 ENCOUNTER — Telehealth: Payer: Self-pay | Admitting: Podiatry

## 2018-01-28 DIAGNOSIS — E1142 Type 2 diabetes mellitus with diabetic polyneuropathy: Secondary | ICD-10-CM | POA: Diagnosis not present

## 2018-01-28 DIAGNOSIS — B351 Tinea unguium: Secondary | ICD-10-CM

## 2018-01-28 DIAGNOSIS — M2041 Other hammer toe(s) (acquired), right foot: Secondary | ICD-10-CM | POA: Diagnosis not present

## 2018-01-28 DIAGNOSIS — M2011 Hallux valgus (acquired), right foot: Secondary | ICD-10-CM | POA: Diagnosis not present

## 2018-01-28 DIAGNOSIS — M2012 Hallux valgus (acquired), left foot: Secondary | ICD-10-CM

## 2018-01-28 DIAGNOSIS — L081 Erythrasma: Secondary | ICD-10-CM | POA: Diagnosis not present

## 2018-01-28 DIAGNOSIS — M2042 Other hammer toe(s) (acquired), left foot: Secondary | ICD-10-CM

## 2018-01-28 DIAGNOSIS — B353 Tinea pedis: Secondary | ICD-10-CM

## 2018-01-28 MED ORDER — KETOCONAZOLE 2 % EX CREA: TOPICAL_CREAM | CUTANEOUS | 0 refills | Status: DC

## 2018-01-28 MED ORDER — ERYTHROMYCIN 2 % EX GEL: Freq: Every day | CUTANEOUS | 0 refills | Status: DC

## 2018-01-28 NOTE — Telephone Encounter (Signed)
I informed pt the prescription had been printed and he could pick it up in the [place] office.

## 2018-01-28 NOTE — Addendum Note (Signed)
Addended by: Alphia Kava D on: 01/28/2018 11:32 AM   Modules accepted: Orders

## 2018-01-28 NOTE — Progress Notes (Signed)
Subjective:  Patient ID: Kristopher Scott, male    DOB: Sep 06, 1955,  MRN: [id number]  Chief Complaint  Patient presents with  . Debridement    Bilateral callous and nail trim    62 y.o. male presents  for diabetic foot care. Last AMBS was between 97-108. Had issues where he had picked at the area of his left foot and went to urgent care was given an antibiotic.  Reports numbness and tingling in their feet. Denies cramping in legs and thighs.  Review of Systems: Negative except as noted in the HPI. Denies N/V/F/Ch.  Past Medical History:  Diagnosis Date  . Back pain   . Cardiac defibrillator in place   . Cluster headaches   . Diabetes mellitus without complication (HCC)   . Hypertension     Current Outpatient Medications:  .  allopurinol (ZYLOPRIM) 100 MG tablet, Take 100 mg by mouth daily. , Disp: , Rfl:  .  aspirin 81 MG tablet, Take 81 mg by mouth daily., Disp: , Rfl:  .  atorvastatin (LIPITOR) 80 MG tablet, Take 40 mg by mouth daily. , Disp: , Rfl:  .  carvedilol (COREG) 6.25 MG tablet, Take 3.125 mg by mouth 2 (two) times daily with a meal., Disp: , Rfl:  .  clonazePAM (KLONOPIN) 2 MG tablet, Take 2 mg by mouth 2 (two) times daily as needed for anxiety (sleep)., Disp: , Rfl:  .  cyclobenzaprine (FLEXERIL) 10 MG tablet, Take 1 tablet (10 mg total) by mouth 2 (two) times daily as needed for muscle spasms., Disp: 20 tablet, Rfl: 0 .  furosemide (LASIX) 40 MG tablet, Take 1 tablet (40 mg total) by mouth daily., Disp: 15 tablet, Rfl: 0 .  gabapentin (NEURONTIN) 300 MG capsule, Take 300 mg by mouth 2 (two) times daily as needed (pain). , Disp: , Rfl:  .  isosorbide mononitrate (IMDUR) 60 MG 24 hr tablet, Take 60 mg by mouth., Disp: , Rfl:  .  losartan (COZAAR) 25 MG tablet, Take 25 mg by mouth daily. , Disp: , Rfl:  .  methocarbamol (ROBAXIN) 500 MG tablet, Take 2 tablets (1,000 mg total) by mouth every 8 (eight) hours as needed for muscle spasms., Disp: 30 tablet, Rfl: 0 .   naproxen (NAPROSYN) 375 MG tablet, Take 1 tablet (375 mg total) by mouth 2 (two) times daily., Disp: 20 tablet, Rfl: 0 .  nitroGLYCERIN (NITROSTAT) 0.4 MG SL tablet, Place 0.4 mg under the tongue every 5 (five) minutes as needed for chest pain. , Disp: , Rfl:  .  oxyCODONE (OXY IR/ROXICODONE) 5 MG immediate release tablet, Take 5 mg by mouth every 6 (six) hours as needed for moderate pain or severe pain. , Disp: , Rfl:  .  oxyCODONE-acetaminophen (PERCOCET/ROXICET) 5-325 MG tablet, Take 1 tablet by mouth every 4 (four) hours as needed for severe pain., Disp: 5 tablet, Rfl: 0 .  traZODone (DESYREL) 100 MG tablet, Take 100 mg by mouth at bedtime as needed for sleep. , Disp: , Rfl:  .  erythromycin with ethanol (ERYGEL) 2 % gel, Apply topically daily. To affected area in between toes of left foot, Disp: 30 g, Rfl: 0 .  ketoconazole (NIZORAL) 2 % cream, Apply 1 fingertip amount to bottom of each foot daily., Disp: 30 g, Rfl: 0  Social History   Tobacco Use  Smoking Status Never Smoker  Smokeless Tobacco Never Used    Allergies  Allergen Reactions  . Propranolol Itching   Objective:  There were no  vitals filed for this visit. There is no height or weight on file to calculate BMI. Constitutional Well developed. Well nourished.  Vascular Dorsalis pedis pulses present 2+ bilaterally  Posterior tibial pulses present 2+ bilaterally  Pedal hair growth diminished. Capillary refill normal to all digits.  No cyanosis or clubbing noted.  Neurologic Normal speech. Oriented to person, place, and time. Epicritic sensation to light touch grossly present bilaterally. Protective sensation with 5.07 monofilament  absent bilaterally. Vibratory sensation present bilaterally.  Dermatologic Nails elongated, thickened, dystrophic. No open wounds. Hyperkeratosis first MPJ bilaterally, medial fourth toe left Interdigital maceration left 4th interspace. Xerosis with scaling plantar bilateral  Orthopedic:  Normal joint ROM without pain or crepitus bilaterally. HAV deformity bilaterally hammertoes bilateral No bony tenderness.   Assessment:   1. DM type 2 with diabetic peripheral neuropathy (HCC)   2. Onychomycosis   3. Acquired hallux valgus of both feet   4. Hammer toes of both feet   5. Tinea pedis of both feet   6. Erythrasma    Plan:  Patient was evaluated and treated and all questions answered.  Diabetes with DPN, Onychomycosis -Educated on diabetic footcare. Diabetic risk level 2 -Nails x10 debrided sharply and manually with large nail nipper and rotary burr.  -Would benefit from diabetic shoes.  Will make appointment for fabrication.  Procedure: Nail Debridement Rationale: Patient meets criteria for routine foot care due to DPN Type of Debridement: manual, sharp debridement. Instrumentation: Nail nipper, rotary burr. Number of Nails: 10   Procedure: Paring of Lesion Rationale: painful hyperkeratotic lesion Type of Debridement: manual, sharp debridement. Instrumentation: 312 blade Number of Lesions: 3   Tinea pedis bilaterally, erythrasma left fourth interspace -Rx ketoconazole for tinea -Rx ery gel for erythrasma  Return in about 2 months (around 03/31/2018).

## 2018-01-28 NOTE — Telephone Encounter (Signed)
Pt needs a hand written/or printed prescription for erythromycin. He went to the pharmacy and it was too much out of pocket. He will take his prescription over the the [facility].

## 2018-03-29 DIAGNOSIS — M1712 Unilateral primary osteoarthritis, left knee: Secondary | ICD-10-CM | POA: Insufficient documentation

## 2018-03-29 DIAGNOSIS — M1711 Unilateral primary osteoarthritis, right knee: Secondary | ICD-10-CM | POA: Insufficient documentation

## 2018-04-08 ENCOUNTER — Encounter: Payer: Self-pay | Admitting: Podiatry

## 2018-04-08 ENCOUNTER — Ambulatory Visit (INDEPENDENT_AMBULATORY_CARE_PROVIDER_SITE_OTHER): Payer: Medicare PPO | Admitting: Podiatry

## 2018-04-08 DIAGNOSIS — B353 Tinea pedis: Secondary | ICD-10-CM

## 2018-04-08 DIAGNOSIS — E1142 Type 2 diabetes mellitus with diabetic polyneuropathy: Secondary | ICD-10-CM | POA: Diagnosis not present

## 2018-04-08 DIAGNOSIS — M2011 Hallux valgus (acquired), right foot: Secondary | ICD-10-CM

## 2018-04-08 DIAGNOSIS — L84 Corns and callosities: Secondary | ICD-10-CM | POA: Diagnosis not present

## 2018-04-08 DIAGNOSIS — B351 Tinea unguium: Secondary | ICD-10-CM

## 2018-04-08 DIAGNOSIS — M2012 Hallux valgus (acquired), left foot: Secondary | ICD-10-CM

## 2018-04-08 MED ORDER — CLOTRIMAZOLE 1 % EX CREA: TOPICAL_CREAM | CUTANEOUS | 0 refills | Status: DC

## 2018-04-08 NOTE — Progress Notes (Signed)
Subjective:  Patient ID: Kristopher Scott, male    DOB: 02/18/1956,  MRN: [id number]  Chief Complaint  Patient presents with  . Nail Problem    bilateral debridement of painful nails and callouses    62 y.o. male presents  for diabetic foot care. Denies new pedal complaints. Still having issues with the bottom of the feet.  Reports numbness and tingling in their feet. Denies cramping in legs and thighs.  Review of Systems: Negative except as noted in the HPI. Denies N/V/F/Ch.  Past Medical History:  Diagnosis Date  . Back pain   . Cardiac defibrillator in place   . Cluster headaches   . Diabetes mellitus without complication (HCC)   . Hypertension     Current Outpatient Medications:  .  allopurinol (ZYLOPRIM) 100 MG tablet, Take 100 mg by mouth daily. , Disp: , Rfl:  .  aspirin 81 MG tablet, Take 81 mg by mouth daily., Disp: , Rfl:  .  atorvastatin (LIPITOR) 80 MG tablet, Take 40 mg by mouth daily. , Disp: , Rfl:  .  carvedilol (COREG) 6.25 MG tablet, Take 3.125 mg by mouth 2 (two) times daily with a meal., Disp: , Rfl:  .  clonazePAM (KLONOPIN) 2 MG tablet, Take 2 mg by mouth 2 (two) times daily as needed for anxiety (sleep)., Disp: , Rfl:  .  cyclobenzaprine (FLEXERIL) 10 MG tablet, Take 1 tablet (10 mg total) by mouth 2 (two) times daily as needed for muscle spasms., Disp: 20 tablet, Rfl: 0 .  erythromycin with ethanol (ERYGEL) 2 % gel, Apply topically daily. To affected area in between toes of left foot, Disp: 30 g, Rfl: 0 .  furosemide (LASIX) 40 MG tablet, Take 1 tablet (40 mg total) by mouth daily., Disp: 15 tablet, Rfl: 0 .  gabapentin (NEURONTIN) 300 MG capsule, Take 300 mg by mouth 2 (two) times daily as needed (pain). , Disp: , Rfl:  .  isosorbide mononitrate (IMDUR) 60 MG 24 hr tablet, Take 60 mg by mouth., Disp: , Rfl:  .  ketoconazole (NIZORAL) 2 % cream, Apply 1 fingertip amount to bottom of each foot daily., Disp: 30 g, Rfl: 0 .  losartan (COZAAR) 25 MG tablet,  Take 25 mg by mouth daily. , Disp: , Rfl:  .  methocarbamol (ROBAXIN) 500 MG tablet, Take 2 tablets (1,000 mg total) by mouth every 8 (eight) hours as needed for muscle spasms., Disp: 30 tablet, Rfl: 0 .  naproxen (NAPROSYN) 375 MG tablet, Take 1 tablet (375 mg total) by mouth 2 (two) times daily., Disp: 20 tablet, Rfl: 0 .  nitroGLYCERIN (NITROSTAT) 0.4 MG SL tablet, Place 0.4 mg under the tongue every 5 (five) minutes as needed for chest pain. , Disp: , Rfl:  .  oxyCODONE (OXY IR/ROXICODONE) 5 MG immediate release tablet, Take 5 mg by mouth every 6 (six) hours as needed for moderate pain or severe pain. , Disp: , Rfl:  .  oxyCODONE-acetaminophen (PERCOCET/ROXICET) 5-325 MG tablet, Take 1 tablet by mouth every 4 (four) hours as needed for severe pain., Disp: 5 tablet, Rfl: 0 .  traZODone (DESYREL) 100 MG tablet, Take 100 mg by mouth at bedtime as needed for sleep. , Disp: , Rfl:  .  clotrimazole (LOTRIMIN) 1 % cream, Apply fingertip amount twice daily, Disp: 30 g, Rfl: 0  Social History   Tobacco Use  Smoking Status Never Smoker  Smokeless Tobacco Never Used    Allergies  Allergen Reactions  . Spironolactone Other (See  Comments)  . Propranolol Itching   Objective:  There were no vitals filed for this visit. There is no height or weight on file to calculate BMI. Constitutional Well developed. Well nourished.  Vascular Dorsalis pedis pulses present 2+ bilaterally  Posterior tibial pulses present 2+ bilaterally  Pedal hair growth diminished. Capillary refill normal to all digits.  No cyanosis or clubbing noted.  Neurologic Normal speech. Oriented to person, place, and time. Epicritic sensation to light touch grossly present bilaterally. Protective sensation with 5.07 monofilament  absent bilaterally. Vibratory sensation present bilaterally.  Dermatologic Nails elongated, thickened, dystrophic. No open wounds. Hyperkeratosis first MPJ bilaterally Xerosis with scaling plantar  bilateral  Orthopedic: Normal joint ROM without pain or crepitus bilaterally. HAV deformity bilaterally hammertoes bilateral No bony tenderness.   Assessment:   1. DM type 2 with diabetic peripheral neuropathy (HCC)   2. Onychomycosis   3. Acquired hallux valgus of both feet   4. Tinea pedis of both feet   5. Callus    Plan:  Patient was evaluated and treated and all questions answered.  Diabetes with DPN, Onychomycosis -Educated on diabetic footcare. Diabetic risk level 2 -Nails x10 debrided sharply and manually with large nail nipper and rotary burr.   Procedure: Nail Debridement Rationale: Patient meets criteria for routine foot care due to DPN Type of Debridement: manual, sharp debridement. Instrumentation: Nail nipper, rotary burr. Number of Nails: 10   Procedure: Paring of Lesion Rationale: painful hyperkeratotic lesion Type of Debridement: manual, sharp debridement. Instrumentation: 312 blade Number of Lesions: 2   Tinea pedis bilaterally -Rx clotrimaozle. Educated on use.  Return in about 1 month (around 05/09/2018) for Diabetic Foot Care.

## 2018-05-04 ENCOUNTER — Other Ambulatory Visit: Payer: Self-pay | Admitting: Orthopedic Surgery

## 2018-05-04 DIAGNOSIS — M1712 Unilateral primary osteoarthritis, left knee: Secondary | ICD-10-CM

## 2018-05-06 ENCOUNTER — Ambulatory Visit (INDEPENDENT_AMBULATORY_CARE_PROVIDER_SITE_OTHER): Payer: Medicare PPO | Admitting: Podiatry

## 2018-05-06 DIAGNOSIS — B351 Tinea unguium: Secondary | ICD-10-CM | POA: Diagnosis not present

## 2018-05-06 DIAGNOSIS — E1142 Type 2 diabetes mellitus with diabetic polyneuropathy: Secondary | ICD-10-CM | POA: Diagnosis not present

## 2018-05-06 DIAGNOSIS — B353 Tinea pedis: Secondary | ICD-10-CM

## 2018-05-06 NOTE — Progress Notes (Signed)
Subjective:  Patient ID: Kristopher Scott, male    DOB: 1955/10/31,  MRN: [id number]  Chief Complaint  Patient presents with  . Foot Problem    Routine diabetic foot care and follow up on athletes foot. Pt states his medication has been effective.    63 y.o. male presents  for diabetic foot care. Denies new pedal complaints. Thinks the cream is helping but still having some cracking on the feet.  Reports numbness and tingling in their feet. Denies cramping in legs and thighs.  Review of Systems: Negative except as noted in the HPI. Denies N/V/F/Ch.  Past Medical History:  Diagnosis Date  . Back pain   . Cardiac defibrillator in place   . Cluster headaches   . Diabetes mellitus without complication (HCC)   . Hypertension     Current Outpatient Medications:  .  allopurinol (ZYLOPRIM) 100 MG tablet, Take 100 mg by mouth daily. , Disp: , Rfl:  .  aspirin 81 MG tablet, Take 81 mg by mouth daily., Disp: , Rfl:  .  atorvastatin (LIPITOR) 80 MG tablet, Take 40 mg by mouth daily. , Disp: , Rfl:  .  carvedilol (COREG) 6.25 MG tablet, Take 3.125 mg by mouth 2 (two) times daily with a meal., Disp: , Rfl:  .  clonazePAM (KLONOPIN) 2 MG tablet, Take 2 mg by mouth 2 (two) times daily as needed for anxiety (sleep)., Disp: , Rfl:  .  clotrimazole (LOTRIMIN) 1 % cream, Apply fingertip amount twice daily, Disp: 30 g, Rfl: 0 .  cyclobenzaprine (FLEXERIL) 10 MG tablet, Take 1 tablet (10 mg total) by mouth 2 (two) times daily as needed for muscle spasms., Disp: 20 tablet, Rfl: 0 .  erythromycin with ethanol (ERYGEL) 2 % gel, Apply topically daily. To affected area in between toes of left foot, Disp: 30 g, Rfl: 0 .  furosemide (LASIX) 40 MG tablet, Take 1 tablet (40 mg total) by mouth daily., Disp: 15 tablet, Rfl: 0 .  gabapentin (NEURONTIN) 300 MG capsule, Take 300 mg by mouth 2 (two) times daily as needed (pain). , Disp: , Rfl:  .  isosorbide mononitrate (IMDUR) 60 MG 24 hr tablet, Take 60 mg by  mouth., Disp: , Rfl:  .  ketoconazole (NIZORAL) 2 % cream, Apply 1 fingertip amount to bottom of each foot daily., Disp: 30 g, Rfl: 0 .  losartan (COZAAR) 25 MG tablet, Take 25 mg by mouth daily. , Disp: , Rfl:  .  methocarbamol (ROBAXIN) 500 MG tablet, Take 2 tablets (1,000 mg total) by mouth every 8 (eight) hours as needed for muscle spasms., Disp: 30 tablet, Rfl: 0 .  naproxen (NAPROSYN) 375 MG tablet, Take 1 tablet (375 mg total) by mouth 2 (two) times daily., Disp: 20 tablet, Rfl: 0 .  nitroGLYCERIN (NITROSTAT) 0.4 MG SL tablet, Place 0.4 mg under the tongue every 5 (five) minutes as needed for chest pain. , Disp: , Rfl:  .  oxyCODONE (OXY IR/ROXICODONE) 5 MG immediate release tablet, Take 5 mg by mouth every 6 (six) hours as needed for moderate pain or severe pain. , Disp: , Rfl:  .  oxyCODONE-acetaminophen (PERCOCET/ROXICET) 5-325 MG tablet, Take 1 tablet by mouth every 4 (four) hours as needed for severe pain., Disp: 5 tablet, Rfl: 0 .  traZODone (DESYREL) 100 MG tablet, Take 100 mg by mouth at bedtime as needed for sleep. , Disp: , Rfl:   Social History   Tobacco Use  Smoking Status Never Smoker  Smokeless Tobacco  Never Used    Allergies  Allergen Reactions  . Spironolactone Other (See Comments)  . Propranolol Itching   Objective:  There were no vitals filed for this visit. There is no height or weight on file to calculate BMI. Constitutional Well developed. Well nourished.  Vascular Dorsalis pedis pulses present 2+ bilaterally  Posterior tibial pulses present 2+ bilaterally  Pedal hair growth diminished. Capillary refill normal to all digits.  No cyanosis or clubbing noted.  Neurologic Normal speech. Oriented to person, place, and time. Epicritic sensation to light touch grossly present bilaterally. Protective sensation with 5.07 monofilament  absent bilaterally. Vibratory sensation present bilaterally.  Dermatologic Nails elongated, thickened, dystrophic. No open  wounds. Hyperkeratosis first MPJ bilaterally Xerosis with scaling plantar bilateral  Orthopedic: Normal joint ROM without pain or crepitus bilaterally. HAV deformity bilaterally hammertoes bilateral No bony tenderness.   Assessment:   1. DM type 2 with diabetic peripheral neuropathy (HCC)   2. Onychomycosis   3. Tinea pedis of both feet    Plan:  Patient was evaluated and treated and all questions answered.  Diabetes with DPN, Onychomycosis -Educated on diabetic footcare. Diabetic risk level 2 -Nails x10 debrided sharply and manually with large nail nipper and rotary burr.   Procedure: Nail Debridement Rationale: Patient meets criteria for routine foot care due to DPN Type of Debridement: manual, sharp debridement. Instrumentation: Nail nipper, rotary burr. Number of Nails: 10  Tinea Pedis -Continue antifungal cream -Discussed hygiene and sterilizing shoes.  Return in about 3 months (around 08/05/2018) for Diabetic Foot Care.

## 2018-05-07 ENCOUNTER — Ambulatory Visit
Admission: RE | Admit: 2018-05-07 | Discharge: 2018-05-07 | Disposition: A | Discharge status: Home / Self Care | Payer: Non-veteran care | Source: Ambulatory Visit | Attending: Orthopedic Surgery | Admitting: Orthopedic Surgery

## 2018-05-07 DIAGNOSIS — M1712 Unilateral primary osteoarthritis, left knee: Secondary | ICD-10-CM

## 2018-05-07 MED ORDER — IOPAMIDOL (ISOVUE-M 200) INJECTION 41%
10.0000 mL | Freq: Once | INTRAMUSCULAR | Status: AC
  Administered 2018-05-07: 30 mL via INTRA_ARTICULAR

## 2018-05-12 DIAGNOSIS — I10 Essential (primary) hypertension: Secondary | ICD-10-CM | POA: Insufficient documentation

## 2018-05-12 DIAGNOSIS — I5020 Unspecified systolic (congestive) heart failure: Secondary | ICD-10-CM | POA: Insufficient documentation

## 2018-05-12 DIAGNOSIS — E7800 Pure hypercholesterolemia, unspecified: Secondary | ICD-10-CM | POA: Insufficient documentation

## 2018-05-12 DIAGNOSIS — I509 Heart failure, unspecified: Secondary | ICD-10-CM | POA: Insufficient documentation

## 2018-05-12 DIAGNOSIS — S83289A Other tear of lateral meniscus, current injury, unspecified knee, initial encounter: Secondary | ICD-10-CM | POA: Insufficient documentation

## 2018-08-01 ENCOUNTER — Encounter: Payer: Self-pay | Admitting: Podiatry

## 2018-08-05 ENCOUNTER — Ambulatory Visit (INDEPENDENT_AMBULATORY_CARE_PROVIDER_SITE_OTHER): Payer: Medicare PPO | Admitting: Podiatry

## 2018-08-05 ENCOUNTER — Other Ambulatory Visit: Payer: Self-pay

## 2018-08-05 VITALS — Temp 97.5°F

## 2018-08-05 DIAGNOSIS — E1142 Type 2 diabetes mellitus with diabetic polyneuropathy: Secondary | ICD-10-CM | POA: Diagnosis not present

## 2018-08-05 DIAGNOSIS — B351 Tinea unguium: Secondary | ICD-10-CM | POA: Diagnosis not present

## 2018-08-05 DIAGNOSIS — B353 Tinea pedis: Secondary | ICD-10-CM

## 2018-08-05 DIAGNOSIS — M79676 Pain in unspecified toe(s): Secondary | ICD-10-CM

## 2018-08-05 NOTE — Progress Notes (Signed)
Subjective:  Patient ID: Kristopher Scott, male    DOB: [birth year],  MRN: [id number]  Chief Complaint  Patient presents with  . Nail Problem    debride, concerned with Rt hallux nail discoloration    63 y.o. male presents  for diabetic foot care. States the cream is helping his foot, noticing great improvement especially between the toes.  Reports numbness and tingling in their feet. Denies cramping in legs and thighs.  Review of Systems: Negative except as noted in the HPI. Denies N/V/F/Ch.  Past Medical History:  Diagnosis Date  . Back pain   . Cardiac defibrillator in place   . Cluster headaches   . Diabetes mellitus without complication (HCC)   . Hypertension     Current Outpatient Medications:  .  pregabalin (LYRICA) 75 MG capsule, Take 75 mg by mouth daily., Disp: , Rfl:  .  allopurinol (ZYLOPRIM) 100 MG tablet, Take 100 mg by mouth daily. , Disp: , Rfl:  .  aspirin 81 MG tablet, Take 81 mg by mouth daily., Disp: , Rfl:  .  atorvastatin (LIPITOR) 80 MG tablet, Take 40 mg by mouth daily. , Disp: , Rfl:  .  carvedilol (COREG) 6.25 MG tablet, Take 3.125 mg by mouth 2 (two) times daily with a meal., Disp: , Rfl:  .  clonazePAM (KLONOPIN) 2 MG tablet, Take 2 mg by mouth 2 (two) times daily as needed for anxiety (sleep)., Disp: , Rfl:  .  clotrimazole (LOTRIMIN) 1 % cream, Apply fingertip amount twice daily, Disp: 30 g, Rfl: 0 .  cyclobenzaprine (FLEXERIL) 10 MG tablet, Take 1 tablet (10 mg total) by mouth 2 (two) times daily as needed for muscle spasms., Disp: 20 tablet, Rfl: 0 .  erythromycin with ethanol (ERYGEL) 2 % gel, Apply topically daily. To affected area in between toes of left foot, Disp: 30 g, Rfl: 0 .  furosemide (LASIX) 40 MG tablet, Take 1 tablet (40 mg total) by mouth daily., Disp: 15 tablet, Rfl: 0 .  isosorbide mononitrate (IMDUR) 60 MG 24 hr tablet, Take 60 mg by mouth., Disp: , Rfl:  .  ketoconazole (NIZORAL) 2 % cream, Apply 1 fingertip amount to bottom of  each foot daily., Disp: 30 g, Rfl: 0 .  losartan (COZAAR) 25 MG tablet, Take 25 mg by mouth daily. , Disp: , Rfl:  .  methocarbamol (ROBAXIN) 500 MG tablet, Take 2 tablets (1,000 mg total) by mouth every 8 (eight) hours as needed for muscle spasms., Disp: 30 tablet, Rfl: 0 .  naproxen (NAPROSYN) 375 MG tablet, Take 1 tablet (375 mg total) by mouth 2 (two) times daily., Disp: 20 tablet, Rfl: 0 .  nitroGLYCERIN (NITROSTAT) 0.4 MG SL tablet, Place 0.4 mg under the tongue every 5 (five) minutes as needed for chest pain. , Disp: , Rfl:  .  oxyCODONE (OXY IR/ROXICODONE) 5 MG immediate release tablet, Take 5 mg by mouth every 6 (six) hours as needed for moderate pain or severe pain. , Disp: , Rfl:  .  oxyCODONE-acetaminophen (PERCOCET/ROXICET) 5-325 MG tablet, Take 1 tablet by mouth every 4 (four) hours as needed for severe pain., Disp: 5 tablet, Rfl: 0 .  traZODone (DESYREL) 100 MG tablet, Take 100 mg by mouth at bedtime as needed for sleep. , Disp: , Rfl:   Social History   Tobacco Use  Smoking Status Never Smoker  Smokeless Tobacco Never Used    Allergies  Allergen Reactions  . Spironolactone Other (See Comments)  . Propranolol Itching  Objective:   Vitals:   08/05/18 0908  Temp: (!) 97.5 F (36.4 C)   There is no height or weight on file to calculate BMI. Constitutional Well developed. Well nourished.  Vascular Dorsalis pedis pulses present 2+ bilaterally  Posterior tibial pulses present 2+ bilaterally  Pedal hair growth diminished. Capillary refill normal to all digits.  No cyanosis or clubbing noted.  Neurologic Normal speech. Oriented to person, place, and time. Epicritic sensation to light touch grossly present bilaterally. Protective sensation with 5.07 monofilament  absent bilaterally. Vibratory sensation present bilaterally.  Dermatologic Nails elongated, thickened, dystrophic. No open wounds. Hyperkeratosis first MPJ bilaterally Xerosis with scaling plantar bilateral   Orthopedic: Normal joint ROM without pain or crepitus bilaterally. HAV deformity bilaterally hammertoes bilateral No bony tenderness.   Assessment:   1. DM type 2 with diabetic peripheral neuropathy (HCC)   2. Pain due to onychomycosis of toenail   3. Tinea pedis of both feet    Plan:  Patient was evaluated and treated and all questions answered.  Diabetes with DPN, Onychomycosis -Educated on diabetic footcare. Diabetic risk level 2 -Nails x10 debrided sharply and manually with large nail nipper and rotary burr.   Procedure: Nail Debridement Rationale: Patient meets criteria for routine foot care due to DPN Type of Debridement: manual, sharp debridement. Instrumentation: Nail nipper, rotary burr. Number of Nails: 10   Tinea Pedis -Improved. Continue antifungal cream PRN.  Return in about 3 months (around 11/04/2018) for Routine Foot Care.

## 2018-11-02 ENCOUNTER — Telehealth: Payer: Self-pay

## 2018-11-04 ENCOUNTER — Ambulatory Visit: Payer: Medicare PPO | Admitting: Podiatry

## 2018-11-04 ENCOUNTER — Other Ambulatory Visit: Payer: Self-pay

## 2018-11-04 ENCOUNTER — Ambulatory Visit (INDEPENDENT_AMBULATORY_CARE_PROVIDER_SITE_OTHER): Payer: Medicare HMO | Admitting: Podiatry

## 2018-11-04 VITALS — Temp 98.1°F

## 2018-11-04 DIAGNOSIS — B351 Tinea unguium: Secondary | ICD-10-CM | POA: Diagnosis not present

## 2018-11-04 DIAGNOSIS — L84 Corns and callosities: Secondary | ICD-10-CM

## 2018-11-04 DIAGNOSIS — E1142 Type 2 diabetes mellitus with diabetic polyneuropathy: Secondary | ICD-10-CM

## 2018-11-04 NOTE — Progress Notes (Signed)
Subjective:  Patient ID: Kristopher Scott, male    DOB: June 29, 1955,  MRN: [id number]  Chief Complaint  Patient presents with  . Nail Problem    3 month diabetic nail trim    63 y.o. male presents  for diabetic foot care. Unsure of last AMBS. Last A1c unknown. Reports numbness and tingling in their feet. Denies cramping in legs and thighs.  Review of Systems: Negative except as noted in the HPI. Denies N/V/F/Ch.  Past Medical History:  Diagnosis Date  . Back pain   . Cardiac defibrillator in place   . Cluster headaches   . Diabetes mellitus without complication ([place])   . Hypertension     Current Outpatient Medications:  .  allopurinol (ZYLOPRIM) 100 MG tablet, Take 100 mg by mouth daily. , Disp: , Rfl:  .  aspirin 81 MG tablet, Take 81 mg by mouth daily., Disp: , Rfl:  .  atorvastatin (LIPITOR) 80 MG tablet, Take 40 mg by mouth daily. , Disp: , Rfl:  .  carvedilol (COREG) 6.25 MG tablet, Take 3.125 mg by mouth 2 (two) times daily with a meal., Disp: , Rfl:  .  clonazePAM (KLONOPIN) 2 MG tablet, Take 2 mg by mouth 2 (two) times daily as needed for anxiety (sleep)., Disp: , Rfl:  .  clotrimazole (LOTRIMIN) 1 % cream, Apply fingertip amount twice daily, Disp: 30 g, Rfl: 0 .  cyclobenzaprine (FLEXERIL) 10 MG tablet, Take 1 tablet (10 mg total) by mouth 2 (two) times daily as needed for muscle spasms., Disp: 20 tablet, Rfl: 0 .  erythromycin with ethanol (ERYGEL) 2 % gel, Apply topically daily. To affected area in between toes of left foot, Disp: 30 g, Rfl: 0 .  furosemide (LASIX) 40 MG tablet, Take 1 tablet (40 mg total) by mouth daily., Disp: 15 tablet, Rfl: 0 .  isosorbide mononitrate (IMDUR) 60 MG 24 hr tablet, Take 60 mg by mouth., Disp: , Rfl:  .  ketoconazole (NIZORAL) 2 % cream, Apply 1 fingertip amount to bottom of each foot daily., Disp: 30 g, Rfl: 0 .  losartan (COZAAR) 25 MG tablet, Take 25 mg by mouth daily. , Disp: , Rfl:  .  methocarbamol (ROBAXIN) 500 MG tablet, Take  2 tablets (1,000 mg total) by mouth every 8 (eight) hours as needed for muscle spasms., Disp: 30 tablet, Rfl: 0 .  naproxen (NAPROSYN) 375 MG tablet, Take 1 tablet (375 mg total) by mouth 2 (two) times daily., Disp: 20 tablet, Rfl: 0 .  nitroGLYCERIN (NITROSTAT) 0.4 MG SL tablet, Place 0.4 mg under the tongue every 5 (five) minutes as needed for chest pain. , Disp: , Rfl:  .  oxyCODONE (OXY IR/ROXICODONE) 5 MG immediate release tablet, Take 5 mg by mouth every 6 (six) hours as needed for moderate pain or severe pain. , Disp: , Rfl:  .  oxyCODONE-acetaminophen (PERCOCET/ROXICET) 5-325 MG tablet, Take 1 tablet by mouth every 4 (four) hours as needed for severe pain., Disp: 5 tablet, Rfl: 0 .  pregabalin (LYRICA) 75 MG capsule, Take 75 mg by mouth daily., Disp: , Rfl:  .  traZODone (DESYREL) 100 MG tablet, Take 100 mg by mouth at bedtime as needed for sleep. , Disp: , Rfl:   Social History   Tobacco Use  Smoking Status Never Smoker  Smokeless Tobacco Never Used    Allergies  Allergen Reactions  . Spironolactone Other (See Comments)  . Propranolol Itching   Objective:   Vitals:   11/04/18 1040  Temp: 98.1 F (36.7 C)   There is no height or weight on file to calculate BMI. Constitutional Well developed. Well nourished.  Vascular Dorsalis pedis pulses present 2+ bilaterally  Posterior tibial pulses present 2+ bilaterally  Pedal hair growth diminished. Capillary refill normal to all digits.  No cyanosis or clubbing noted.  Neurologic Normal speech. Oriented to person, place, and time. Epicritic sensation to light touch grossly present bilaterally. Protective sensation with 5.07 monofilament  absent bilaterally. Vibratory sensation present bilaterally.  Dermatologic Nails elongated, thickened, dystrophic. No open wounds. Hyperkeratosis first MPJ right  Xerosis with scaling plantar bilateral  Orthopedic: Normal joint ROM without pain or crepitus bilaterally. HAV deformity  bilaterally hammertoes bilateral No bony tenderness.   Assessment:   1. DM type 2 with diabetic peripheral neuropathy (HCC)   2. Onychomycosis   3. Callus    Plan:  Patient was evaluated and treated and all questions answered.  Diabetes with DPN, Onychomycosis -Routine foot care as below  Procedure: Nail Debridement Rationale: Patient meets criteria for routine foot care due to DPN Type of Debridement: manual, sharp debridement. Instrumentation: Nail nipper, rotary burr. Number of Nails: 10  Procedure: Paring of Lesion Rationale: painful hyperkeratotic lesion Type of Debridement: manual, sharp debridement. Instrumentation: 312 blade Number of Lesions: 1   No follow-ups on file.

## 2018-11-08 NOTE — Telephone Encounter (Signed)
Patient was seen on 11/04/2018.

## 2018-11-25 DIAGNOSIS — M25662 Stiffness of left knee, not elsewhere classified: Secondary | ICD-10-CM | POA: Insufficient documentation

## 2019-02-11 ENCOUNTER — Ambulatory Visit: Payer: Medicare HMO | Admitting: Podiatry

## 2019-02-17 ENCOUNTER — Other Ambulatory Visit: Payer: Self-pay

## 2019-02-17 ENCOUNTER — Ambulatory Visit (INDEPENDENT_AMBULATORY_CARE_PROVIDER_SITE_OTHER): Payer: Medicare HMO | Admitting: Podiatry

## 2019-02-17 DIAGNOSIS — L84 Corns and callosities: Secondary | ICD-10-CM

## 2019-02-17 DIAGNOSIS — B353 Tinea pedis: Secondary | ICD-10-CM

## 2019-02-17 DIAGNOSIS — E1142 Type 2 diabetes mellitus with diabetic polyneuropathy: Secondary | ICD-10-CM

## 2019-02-17 NOTE — Progress Notes (Signed)
Subjective:  Patient ID: Kristopher Scott, male    DOB: 1956/04/02,  MRN: [id number]  Chief Complaint  Patient presents with  . Nail Problem    Nail trim 1-5 bilateral    63 y.o. male presents  for diabetic foot care. Unsure of last AMBS. Last A1c unknown. Reports numbness and tingling in their feet. Denies cramping in legs and thighs.  Review of Systems: Negative except as noted in the HPI. Denies N/V/F/Ch.  Past Medical History:  Diagnosis Date  . Back pain   . Cardiac defibrillator in place   . Cluster headaches   . Diabetes mellitus without complication ([place])   . Hypertension     Current Outpatient Medications:  .  allopurinol (ZYLOPRIM) 100 MG tablet, Take 100 mg by mouth daily. , Disp: , Rfl:  .  aspirin 81 MG tablet, Take 81 mg by mouth daily., Disp: , Rfl:  .  atorvastatin (LIPITOR) 80 MG tablet, Take 40 mg by mouth daily. , Disp: , Rfl:  .  carvedilol (COREG) 6.25 MG tablet, Take 3.125 mg by mouth 2 (two) times daily with a meal., Disp: , Rfl:  .  clonazePAM (KLONOPIN) 2 MG tablet, Take 2 mg by mouth 2 (two) times daily as needed for anxiety (sleep)., Disp: , Rfl:  .  clotrimazole (LOTRIMIN) 1 % cream, Apply fingertip amount twice daily, Disp: 30 g, Rfl: 0 .  cyclobenzaprine (FLEXERIL) 10 MG tablet, Take 1 tablet (10 mg total) by mouth 2 (two) times daily as needed for muscle spasms., Disp: 20 tablet, Rfl: 0 .  erythromycin with ethanol (ERYGEL) 2 % gel, Apply topically daily. To affected area in between toes of left foot, Disp: 30 g, Rfl: 0 .  furosemide (LASIX) 40 MG tablet, Take 1 tablet (40 mg total) by mouth daily., Disp: 15 tablet, Rfl: 0 .  HYDROcodone-acetaminophen (NORCO/VICODIN) 5-325 MG tablet, hydrocodone 5 mg-acetaminophen 325 mg tablet  TK 1 T PO Q 6 H PRN FOR MODERATE PAIN, Disp: , Rfl:  .  isosorbide mononitrate (IMDUR) 60 MG 24 hr tablet, Take 60 mg by mouth., Disp: , Rfl:  .  ketoconazole (NIZORAL) 2 % cream, Apply 1 fingertip amount to bottom of each  foot daily., Disp: 30 g, Rfl: 0 .  losartan (COZAAR) 25 MG tablet, Take 25 mg by mouth daily. , Disp: , Rfl:  .  methocarbamol (ROBAXIN) 500 MG tablet, Take 2 tablets (1,000 mg total) by mouth every 8 (eight) hours as needed for muscle spasms., Disp: 30 tablet, Rfl: 0 .  naproxen (NAPROSYN) 375 MG tablet, Take 1 tablet (375 mg total) by mouth 2 (two) times daily., Disp: 20 tablet, Rfl: 0 .  nitroGLYCERIN (NITROSTAT) 0.4 MG SL tablet, Place 0.4 mg under the tongue every 5 (five) minutes as needed for chest pain. , Disp: , Rfl:  .  oxyCODONE (OXY IR/ROXICODONE) 5 MG immediate release tablet, Take 5 mg by mouth every 6 (six) hours as needed for moderate pain or severe pain. , Disp: , Rfl:  .  oxyCODONE-acetaminophen (PERCOCET/ROXICET) 5-325 MG tablet, Take 1 tablet by mouth every 4 (four) hours as needed for severe pain., Disp: 5 tablet, Rfl: 0 .  pregabalin (LYRICA) 75 MG capsule, Take 75 mg by mouth daily., Disp: , Rfl:  .  traZODone (DESYREL) 100 MG tablet, Take 100 mg by mouth at bedtime as needed for sleep. , Disp: , Rfl:   Social History   Tobacco Use  Smoking Status Never Smoker  Smokeless Tobacco Never Used  Allergies  Allergen Reactions  . Spironolactone Other (See Comments)  . Propranolol Itching   Objective:   There were no vitals filed for this visit. There is no height or weight on file to calculate BMI. Constitutional Well developed. Well nourished.  Vascular Dorsalis pedis pulses present 2+ bilaterally  Posterior tibial pulses present 2+ bilaterally  Pedal hair growth diminished. Capillary refill normal to all digits.  No cyanosis or clubbing noted.  Neurologic Normal speech. Oriented to person, place, and time. Epicritic sensation to light touch grossly present bilaterally. Protective sensation with 5.07 monofilament  absent bilaterally. Vibratory sensation present bilaterally.  Dermatologic Nails elongated, thickened, dystrophic. No open wounds. Hyperkeratosis  first MPJ right  Xerosis with scaling plantar foot bilat  Orthopedic: Normal joint ROM without pain or crepitus bilaterally. HAV deformity bilaterally hammertoes bilateral No bony tenderness.   Assessment:   1. DM type 2 with diabetic peripheral neuropathy (HCC)   2. Callus    Plan:  Patient was evaluated and treated and all questions answered.  Diabetes with DPN, Onychomycosis -Routine foot care as below  Procedure: Nail Debridement Rationale: Patient meets criteria for routine foot care due to DPN Type of Debridement: manual, sharp debridement. Instrumentation: Nail nipper, rotary burr. Number of Nails: 10  Procedure: Paring of Lesion Rationale: painful hyperkeratotic lesion Type of Debridement: manual, sharp debridement. Instrumentation: 312 blade Number of Lesions: 1     Tinea Pedis  -Discussed use of antifungal cream -Offered refill, no need at this time.  No follow-ups on file.

## 2019-03-14 DIAGNOSIS — M25562 Pain in left knee: Secondary | ICD-10-CM | POA: Insufficient documentation

## 2019-05-19 ENCOUNTER — Ambulatory Visit (INDEPENDENT_AMBULATORY_CARE_PROVIDER_SITE_OTHER): Payer: Medicare Other | Admitting: Podiatry

## 2019-05-19 ENCOUNTER — Other Ambulatory Visit: Payer: Self-pay

## 2019-05-19 DIAGNOSIS — E1142 Type 2 diabetes mellitus with diabetic polyneuropathy: Secondary | ICD-10-CM | POA: Diagnosis not present

## 2019-05-19 DIAGNOSIS — B351 Tinea unguium: Secondary | ICD-10-CM

## 2019-05-19 MED ORDER — FLUCONAZOLE 150 MG PO TABS: 150.0000 mg | ORAL_TABLET | Freq: Every day | ORAL | 0 refills | Status: AC

## 2019-08-03 ENCOUNTER — Encounter: Payer: Self-pay | Admitting: Podiatry

## 2019-08-03 NOTE — Progress Notes (Signed)
  Subjective:  Patient ID: Kristopher Scott, male    DOB: 07-03-1955,  MRN: [id number]  Chief Complaint  Patient presents with  . Diabetes Mellitus    Diabetic foot exam  . Nail Problem    Nail trim 1-5 bilateral    64 y.o. male presents with the above complaint. History confirmed with patient.   Objective:  Physical Exam: warm, good capillary refill, nail exam onychomycosis of the toenails, no trophic changes or ulcerative lesions. DP pulses palpable, PT pulses palpable and protective sensation absent   No images are attached to the encounter.  Assessment:   1. DM type 2 with diabetic peripheral neuropathy (HCC)   2. Onychomycosis     Plan:  Patient was evaluated and treated and all questions answered.  Onychodystrophy, Diabetes and DPN -Patient is diabetic with a qualifying condition for at risk foot care.  Procedure: Nail Debridement Rationale: Patient meets criteria for routine foot care due to DPN Type of Debridement: manual, sharp debridement. Instrumentation: Nail nipper, rotary burr. Number of Nails: 10    Return in about 3 months (around 08/17/2019) for Diabetic Foot Care.

## 2019-08-12 DIAGNOSIS — M5416 Radiculopathy, lumbar region: Secondary | ICD-10-CM | POA: Insufficient documentation

## 2019-08-18 ENCOUNTER — Other Ambulatory Visit: Payer: Self-pay

## 2019-08-18 ENCOUNTER — Ambulatory Visit (INDEPENDENT_AMBULATORY_CARE_PROVIDER_SITE_OTHER): Payer: Medicare Other | Admitting: Podiatry

## 2019-08-18 DIAGNOSIS — B351 Tinea unguium: Secondary | ICD-10-CM

## 2019-08-18 DIAGNOSIS — E1142 Type 2 diabetes mellitus with diabetic polyneuropathy: Secondary | ICD-10-CM

## 2019-08-18 DIAGNOSIS — E1169 Type 2 diabetes mellitus with other specified complication: Secondary | ICD-10-CM

## 2019-08-18 NOTE — Progress Notes (Signed)
  Subjective:  Patient ID: Kristopher Scott, male    DOB: 1956/01/17,  MRN: [id number]  Chief Complaint  Patient presents with  . Nail Problem    Nail trim 1-5 bilateral    64 y.o. male presents with the above complaint. History confirmed with patient.   Objective:  Physical Exam: warm, good capillary refill, nail exam onychomycosis of the toenails, no trophic changes or ulcerative lesions. DP pulses palpable, PT pulses palpable and protective sensation absent   No images are attached to the encounter.  Assessment:   1. Onychomycosis of multiple toenails with type 2 diabetes mellitus and peripheral neuropathy (HCC)     Plan:  Patient was evaluated and treated and all questions answered.  Onychodystrophy, Diabetes and DPN -Patient is diabetic with a qualifying condition for at risk foot care.   Procedure: Nail Debridement Rationale: Patient meets criteria for routine foot care due to DPN Type of Debridement: manual, sharp debridement. Instrumentation: Nail nipper, rotary burr. Number of Nails: 10      No follow-ups on file.

## 2019-08-25 ENCOUNTER — Other Ambulatory Visit: Payer: Self-pay | Admitting: Chiropractic Medicine

## 2019-08-25 DIAGNOSIS — M545 Low back pain, unspecified: Secondary | ICD-10-CM

## 2019-08-25 DIAGNOSIS — R296 Repeated falls: Secondary | ICD-10-CM

## 2019-08-25 DIAGNOSIS — R29898 Other symptoms and signs involving the musculoskeletal system: Secondary | ICD-10-CM

## 2019-09-09 ENCOUNTER — Ambulatory Visit
Admission: RE | Admit: 2019-09-09 | Discharge: 2019-09-09 | Disposition: A | Discharge status: Home / Self Care | Payer: Medicare Other | Source: Ambulatory Visit | Attending: Chiropractic Medicine | Admitting: Chiropractic Medicine

## 2019-09-09 DIAGNOSIS — M545 Low back pain, unspecified: Secondary | ICD-10-CM

## 2019-09-09 DIAGNOSIS — R29898 Other symptoms and signs involving the musculoskeletal system: Secondary | ICD-10-CM

## 2019-09-09 DIAGNOSIS — R296 Repeated falls: Secondary | ICD-10-CM

## 2019-11-17 ENCOUNTER — Ambulatory Visit: Admitting: Podiatry

## 2019-12-02 ENCOUNTER — Telehealth: Payer: Self-pay | Admitting: Podiatry

## 2019-12-02 NOTE — Telephone Encounter (Signed)
Left voicemail for patient to call the office and schedule an appt.   Pt sent MyChart message requesting an appt. for [facility]

## 2020-01-01 ENCOUNTER — Emergency Department (HOSPITAL_COMMUNITY): Payer: No Typology Code available for payment source

## 2020-01-01 ENCOUNTER — Encounter (HOSPITAL_COMMUNITY): Payer: Self-pay | Admitting: *Deleted

## 2020-01-01 ENCOUNTER — Other Ambulatory Visit: Payer: Self-pay

## 2020-01-01 ENCOUNTER — Inpatient Hospital Stay (HOSPITAL_COMMUNITY)
Admission: EM | Admit: 2020-01-01 | Discharge: 2020-01-07 | DRG: 292 | Disposition: A | Discharge status: Home / Self Care | Payer: No Typology Code available for payment source | Attending: Cardiovascular Disease | Admitting: Cardiovascular Disease

## 2020-01-01 DIAGNOSIS — E119 Type 2 diabetes mellitus without complications: Secondary | ICD-10-CM | POA: Diagnosis present

## 2020-01-01 DIAGNOSIS — I5043 Acute on chronic combined systolic (congestive) and diastolic (congestive) heart failure: Secondary | ICD-10-CM | POA: Diagnosis present

## 2020-01-01 DIAGNOSIS — Z7901 Long term (current) use of anticoagulants: Secondary | ICD-10-CM

## 2020-01-01 DIAGNOSIS — E7800 Pure hypercholesterolemia, unspecified: Secondary | ICD-10-CM | POA: Diagnosis present

## 2020-01-01 DIAGNOSIS — N179 Acute kidney failure, unspecified: Secondary | ICD-10-CM | POA: Diagnosis present

## 2020-01-01 DIAGNOSIS — I428 Other cardiomyopathies: Secondary | ICD-10-CM | POA: Diagnosis present

## 2020-01-01 DIAGNOSIS — R531 Weakness: Secondary | ICD-10-CM

## 2020-01-01 DIAGNOSIS — E669 Obesity, unspecified: Secondary | ICD-10-CM | POA: Diagnosis present

## 2020-01-01 DIAGNOSIS — I712 Thoracic aortic aneurysm, without rupture, unspecified: Secondary | ICD-10-CM

## 2020-01-01 DIAGNOSIS — Z95810 Presence of automatic (implantable) cardiac defibrillator: Secondary | ICD-10-CM

## 2020-01-01 DIAGNOSIS — Z950 Presence of cardiac pacemaker: Secondary | ICD-10-CM | POA: Diagnosis present

## 2020-01-01 DIAGNOSIS — R0781 Pleurodynia: Secondary | ICD-10-CM

## 2020-01-01 DIAGNOSIS — G8929 Other chronic pain: Secondary | ICD-10-CM | POA: Diagnosis present

## 2020-01-01 DIAGNOSIS — I714 Abdominal aortic aneurysm, without rupture: Secondary | ICD-10-CM | POA: Diagnosis present

## 2020-01-01 DIAGNOSIS — R0789 Other chest pain: Secondary | ICD-10-CM | POA: Diagnosis present

## 2020-01-01 DIAGNOSIS — I110 Hypertensive heart disease with heart failure: Secondary | ICD-10-CM | POA: Diagnosis not present

## 2020-01-01 DIAGNOSIS — I200 Unstable angina: Secondary | ICD-10-CM | POA: Diagnosis not present

## 2020-01-01 DIAGNOSIS — R109 Unspecified abdominal pain: Secondary | ICD-10-CM

## 2020-01-01 DIAGNOSIS — M5416 Radiculopathy, lumbar region: Secondary | ICD-10-CM | POA: Diagnosis present

## 2020-01-01 DIAGNOSIS — I10 Essential (primary) hypertension: Secondary | ICD-10-CM | POA: Diagnosis present

## 2020-01-01 DIAGNOSIS — Z20822 Contact with and (suspected) exposure to covid-19: Secondary | ICD-10-CM | POA: Diagnosis present

## 2020-01-01 DIAGNOSIS — Z6833 Body mass index (BMI) 33.0-33.9, adult: Secondary | ICD-10-CM

## 2020-01-01 DIAGNOSIS — G9340 Encephalopathy, unspecified: Secondary | ICD-10-CM | POA: Diagnosis present

## 2020-01-01 DIAGNOSIS — Z7984 Long term (current) use of oral hypoglycemic drugs: Secondary | ICD-10-CM

## 2020-01-01 DIAGNOSIS — I480 Paroxysmal atrial fibrillation: Secondary | ICD-10-CM | POA: Diagnosis present

## 2020-01-01 DIAGNOSIS — Z833 Family history of diabetes mellitus: Secondary | ICD-10-CM

## 2020-01-01 DIAGNOSIS — M5117 Intervertebral disc disorders with radiculopathy, lumbosacral region: Secondary | ICD-10-CM | POA: Diagnosis present

## 2020-01-01 DIAGNOSIS — M79602 Pain in left arm: Secondary | ICD-10-CM | POA: Diagnosis present

## 2020-01-01 DIAGNOSIS — I719 Aortic aneurysm of unspecified site, without rupture: Secondary | ICD-10-CM

## 2020-01-01 DIAGNOSIS — E785 Hyperlipidemia, unspecified: Secondary | ICD-10-CM | POA: Diagnosis present

## 2020-01-01 DIAGNOSIS — Z8249 Family history of ischemic heart disease and other diseases of the circulatory system: Secondary | ICD-10-CM

## 2020-01-01 DIAGNOSIS — Z79899 Other long term (current) drug therapy: Secondary | ICD-10-CM

## 2020-01-01 DIAGNOSIS — G4730 Sleep apnea, unspecified: Secondary | ICD-10-CM | POA: Diagnosis present

## 2020-01-01 DIAGNOSIS — I5023 Acute on chronic systolic (congestive) heart failure: Secondary | ICD-10-CM | POA: Diagnosis present

## 2020-01-01 DIAGNOSIS — R296 Repeated falls: Secondary | ICD-10-CM | POA: Diagnosis present

## 2020-01-01 HISTORY — DX: Other specified mononeuropathies: G58.8

## 2020-01-01 LAB — CBC
HCT: 42 % (ref 39.0–52.0)
Hemoglobin: 14.1 g/dL (ref 13.0–17.0)
MCH: 28.7 pg (ref 26.0–34.0)
MCHC: 33.6 g/dL (ref 30.0–36.0)
MCV: 85.5 fL (ref 80.0–100.0)
Platelets: 187 10*3/uL (ref 150–400)
RBC: 4.91 MIL/uL (ref 4.22–5.81)
RDW: 15.6 % — ABNORMAL HIGH (ref 11.5–15.5)
WBC: 4.7 10*3/uL (ref 4.0–10.5)
nRBC: 0 % (ref 0.0–0.2)

## 2020-01-01 LAB — BASIC METABOLIC PANEL
Anion gap: 9 (ref 5–15)
BUN: 12 mg/dL (ref 8–23)
CO2: 23 mmol/L (ref 22–32)
Calcium: 9 mg/dL (ref 8.9–10.3)
Chloride: 107 mmol/L (ref 98–111)
Creatinine, Ser: 1.38 mg/dL — ABNORMAL HIGH (ref 0.61–1.24)
GFR calc Af Amer: >60 mL/min (ref >60)
GFR calc non Af Amer: 55 mL/min — ABNORMAL LOW (ref >60)
Glucose, Bld: 165 mg/dL — ABNORMAL HIGH (ref 70–99)
Potassium: 3.5 mmol/L (ref 3.5–5.1)
Sodium: 139 mmol/L (ref 135–145)

## 2020-01-01 LAB — APTT: aPTT: 32 seconds (ref 24–36)

## 2020-01-01 LAB — PROTIME-INR
INR: 1.1 (ref 0.8–1.2)
Prothrombin Time: 13.8 seconds (ref 11.4–15.2)

## 2020-01-01 LAB — TROPONIN I (HIGH SENSITIVITY): Troponin I (High Sensitivity): 6 ng/L (ref <18)

## 2020-01-01 MED ORDER — IOHEXOL 350 MG/ML SOLN
100.0000 mL | Freq: Once | INTRAVENOUS | Status: AC | PRN
  Administered 2020-01-01: 100 mL via INTRAVENOUS

## 2020-01-01 MED ORDER — HYDROMORPHONE HCL 1 MG/ML IJ SOLN
1.0000 mg | Freq: Once | INTRAMUSCULAR | Status: AC
  Administered 2020-01-01: 1 mg via INTRAVENOUS
  Filled 2020-01-01: qty 1

## 2020-01-01 NOTE — ED Notes (Signed)
Pt st's no relief from pain med.  Pt unable to sit still in wheelchair due to pain

## 2020-01-01 NOTE — ED Triage Notes (Signed)
Squeezing left sided CP that began about an hour ago.  This pain was squeezing,  Pt rhythm is paced. Pt had asa pta, 20g in left hand from ems

## 2020-01-02 DIAGNOSIS — I10 Essential (primary) hypertension: Secondary | ICD-10-CM

## 2020-01-02 DIAGNOSIS — R0789 Other chest pain: Secondary | ICD-10-CM

## 2020-01-02 DIAGNOSIS — E7800 Pure hypercholesterolemia, unspecified: Secondary | ICD-10-CM

## 2020-01-02 DIAGNOSIS — I5023 Acute on chronic systolic (congestive) heart failure: Secondary | ICD-10-CM | POA: Diagnosis not present

## 2020-01-02 LAB — URINALYSIS, ROUTINE W REFLEX MICROSCOPIC
Bilirubin Urine: NEGATIVE
Glucose, UA: NEGATIVE mg/dL
Hgb urine dipstick: NEGATIVE
Ketones, ur: NEGATIVE mg/dL
Leukocytes,Ua: NEGATIVE
Nitrite: NEGATIVE
Protein, ur: NEGATIVE mg/dL
Specific Gravity, Urine: 1.017 (ref 1.005–1.030)
pH: 7 (ref 5.0–8.0)

## 2020-01-02 LAB — TROPONIN I (HIGH SENSITIVITY)
Troponin I (High Sensitivity): 11 ng/L (ref <18)
Troponin I (High Sensitivity): 18 ng/L — ABNORMAL HIGH (ref <18)
Troponin I (High Sensitivity): 18 ng/L — ABNORMAL HIGH (ref <18)

## 2020-01-02 LAB — GLUCOSE, CAPILLARY: Glucose-Capillary: 163 mg/dL — ABNORMAL HIGH (ref 70–99)

## 2020-01-02 LAB — HEPATIC FUNCTION PANEL
ALT: 20 U/L (ref 0–44)
AST: 21 U/L (ref 15–41)
Albumin: 3.4 g/dL — ABNORMAL LOW (ref 3.5–5.0)
Alkaline Phosphatase: 53 U/L (ref 38–126)
Bilirubin, Direct: 0.2 mg/dL (ref 0.0–0.2)
Indirect Bilirubin: 0.8 mg/dL (ref 0.3–0.9)
Total Bilirubin: 1 mg/dL (ref 0.3–1.2)
Total Protein: 6.3 g/dL — ABNORMAL LOW (ref 6.5–8.1)

## 2020-01-02 LAB — BRAIN NATRIURETIC PEPTIDE: B Natriuretic Peptide: 137.9 pg/mL — ABNORMAL HIGH (ref 0.0–100.0)

## 2020-01-02 LAB — LIPASE, BLOOD: Lipase: 23 U/L (ref 11–51)

## 2020-01-02 LAB — HIV ANTIBODY (ROUTINE TESTING W REFLEX): HIV Screen 4th Generation wRfx: NONREACTIVE

## 2020-01-02 LAB — SARS CORONAVIRUS 2 BY RT PCR (HOSPITAL ORDER, PERFORMED IN ~~LOC~~ HOSPITAL LAB): SARS Coronavirus 2: NEGATIVE

## 2020-01-02 MED ORDER — ATORVASTATIN CALCIUM 40 MG PO TABS
40.0000 mg | ORAL_TABLET | Freq: Every day | ORAL | Status: DC
  Administered 2020-01-02 – 2020-01-03 (×2): 40 mg via ORAL
  Filled 2020-01-02 (×3): qty 1

## 2020-01-02 MED ORDER — NITROGLYCERIN 0.4 MG SL SUBL: 0.4000 mg | SUBLINGUAL_TABLET | SUBLINGUAL | Status: DC | PRN

## 2020-01-02 MED ORDER — HYDROCODONE-ACETAMINOPHEN 5-325 MG PO TABS
1.0000 | ORAL_TABLET | Freq: Four times a day (QID) | ORAL | Status: DC | PRN
  Administered 2020-01-02 – 2020-01-07 (×6): 1 via ORAL
  Filled 2020-01-02 (×6): qty 1

## 2020-01-02 MED ORDER — ERYTHROMYCIN 2 % EX GEL: Freq: Every day | CUTANEOUS | Status: DC

## 2020-01-02 MED ORDER — PRAZOSIN HCL 2 MG PO CAPS
5.0000 mg | ORAL_CAPSULE | Freq: Every day | ORAL | Status: DC
  Administered 2020-01-04 – 2020-01-06 (×4): 5 mg via ORAL
  Filled 2020-01-02 (×7): qty 1

## 2020-01-02 MED ORDER — ALLOPURINOL 100 MG PO TABS
100.0000 mg | ORAL_TABLET | Freq: Every day | ORAL | Status: DC
  Administered 2020-01-02 – 2020-01-07 (×6): 100 mg via ORAL
  Filled 2020-01-02 (×6): qty 1

## 2020-01-02 MED ORDER — FUROSEMIDE 10 MG/ML IJ SOLN
40.0000 mg | Freq: Two times a day (BID) | INTRAMUSCULAR | Status: AC
  Administered 2020-01-02 – 2020-01-03 (×3): 40 mg via INTRAVENOUS
  Filled 2020-01-02 (×3): qty 4

## 2020-01-02 MED ORDER — CARVEDILOL 3.125 MG PO TABS
3.1250 mg | ORAL_TABLET | Freq: Two times a day (BID) | ORAL | Status: DC
  Administered 2020-01-03 – 2020-01-07 (×10): 3.125 mg via ORAL
  Filled 2020-01-02 (×12): qty 1

## 2020-01-02 MED ORDER — PREGABALIN 75 MG PO CAPS
75.0000 mg | ORAL_CAPSULE | Freq: Every day | ORAL | Status: DC
  Administered 2020-01-02 – 2020-01-07 (×6): 75 mg via ORAL
  Filled 2020-01-02: qty 1
  Filled 2020-01-02: qty 3
  Filled 2020-01-02 (×4): qty 1

## 2020-01-02 MED ORDER — APIXABAN 5 MG PO TABS
5.0000 mg | ORAL_TABLET | Freq: Two times a day (BID) | ORAL | Status: DC
  Administered 2020-01-02 – 2020-01-07 (×10): 5 mg via ORAL
  Filled 2020-01-02 (×11): qty 1

## 2020-01-02 MED ORDER — LOSARTAN POTASSIUM 25 MG PO TABS
25.0000 mg | ORAL_TABLET | Freq: Every day | ORAL | Status: DC
  Administered 2020-01-02 – 2020-01-03 (×2): 25 mg via ORAL
  Filled 2020-01-02 (×2): qty 1

## 2020-01-02 MED ORDER — MORPHINE SULFATE (PF) 4 MG/ML IV SOLN: 4.0000 mg | Freq: Once | INTRAVENOUS | Status: DC

## 2020-01-02 MED ORDER — ONDANSETRON HCL 4 MG/2ML IJ SOLN: 4.0000 mg | Freq: Four times a day (QID) | INTRAMUSCULAR | Status: DC | PRN

## 2020-01-02 MED ORDER — HYDROMORPHONE HCL 1 MG/ML IJ SOLN
1.0000 mg | Freq: Once | INTRAMUSCULAR | Status: AC
  Administered 2020-01-02: 1 mg via INTRAVENOUS
  Filled 2020-01-02: qty 1

## 2020-01-02 MED ORDER — ISOSORBIDE MONONITRATE ER 60 MG PO TB24
60.0000 mg | ORAL_TABLET | Freq: Every day | ORAL | Status: DC
  Administered 2020-01-02 – 2020-01-07 (×6): 60 mg via ORAL
  Filled 2020-01-02 (×2): qty 1
  Filled 2020-01-02: qty 2
  Filled 2020-01-02 (×3): qty 1

## 2020-01-02 MED ORDER — ACETAMINOPHEN 325 MG PO TABS: 650.0000 mg | ORAL_TABLET | ORAL | Status: DC | PRN

## 2020-01-02 MED ORDER — HYDROMORPHONE HCL 1 MG/ML IJ SOLN
1.0000 mg | INTRAMUSCULAR | Status: DC | PRN
  Administered 2020-01-02: 2 mg via INTRAVENOUS
  Administered 2020-01-03 – 2020-01-04 (×2): 1 mg via INTRAVENOUS
  Filled 2020-01-02 (×2): qty 1
  Filled 2020-01-02: qty 2

## 2020-01-02 NOTE — ED Notes (Signed)
Pacemaker interrogated. 

## 2020-01-02 NOTE — ED Provider Notes (Signed)
[facility] EMERGENCY DEPARTMENT Provider Note   CSN: [id number] Arrival date & time: 01/01/20  1905     History Chief Complaint  Patient presents with  . Chest Pain    Kristopher Scott is a 64 y.o. male with pmhx s/f NICM w/ EF 25%, paroxysmal a fib on Eliquis, HTN, HLD who is presenting with chest pain that started 01/01/20 at [facility]. Patient reports that he had chest pain that started yesterday evening when he was walking his dog.  He reports that the chest pain worsened as he was making his way home and had to stop at the end of the driveway.  His neighbor had to help him into the house.  Patient reports that he received nitroglycerin from the EMS but this did not help.  No nor did 1 mg of Dilaudid once he was in the ED.  Patient reports that he has had this pain in the past but has never been this significant.  Chest pain is central and constant.  He reports of breathing makes the pain worse.  He also notes tenderness to palpation of his mid sternum.  He denies any abdominal pain.  Patient received most of his care over at the [facility]  He did recently see Dr. Michelle Piper over at [facility] electrophysiology.  At that time, patient reported he was having stimulation to his diaphragm that he believed was coming from his ICD.  HPI: A 64 year old patient with a history of treated diabetes, hypertension and obesity presents for evaluation of chest pain. Initial onset of pain was less than one hour ago. The patient's chest pain is described as heaviness/pressure/tightness and is worse with exertion. The patient complains of nausea and reports some diaphoresis. The patient's chest pain is middle- or left-sided, is not well-localized, is not sharp and does radiate to the arms/jaw/neck. The patient has a family history of coronary artery disease in a first-degree relative with onset less than age 57. The patient has no history of stroke, has no history of peripheral artery disease,  has not smoked in the past 90 days and has no history of hypercholesterolemia.   Past Medical History:  Diagnosis Date  . Back pain   . Cardiac defibrillator in place   . Cluster headaches   . Diabetes mellitus without complication (HCC)   . Hypertension     Patient Active Problem List   Diagnosis Date Noted  . Acute on chronic systolic (congestive) heart failure (HCC) 01/02/2020  . Lumbar radiculopathy 08/12/2019  . Pain in left knee 03/14/2019  . Stiffness of left knee 11/25/2018  . Congestive heart failure (HCC) 05/12/2018  . Hypercholesterolemia 05/12/2018  . Hypertensive disorder 05/12/2018  . Tear of lateral meniscus of knee 05/12/2018  . Osteoarthritis of left knee 03/29/2018  . Osteoarthritis of right knee 03/29/2018  . Pacemaker 04/17/2017  . Sleep apnea 04/17/2017  . Rotator cuff tear 01/23/2012    Past Surgical History:  Procedure Laterality Date  . HYDROCELE EXCISION / REPAIR    . ROTATOR CUFF REPAIR Right 04/2012       Family History  Problem Relation Age of Onset  . Hypertension Mother   . Diabetes Mother     Social History   Tobacco Use  . Smoking status: Never Smoker  . Smokeless tobacco: Never Used  Vaping Use  . Vaping Use: Never used  Substance Use Topics  . Alcohol use: Yes    Comment: socially  . Drug use: No  Home Medications Prior to Admission medications   Medication Sig Start Date End Date Taking? Authorizing Provider  allopurinol (ZYLOPRIM) 100 MG tablet Take 100 mg by mouth daily.    Yes [provider]  Apixaban (ELIQUIS PO) Take 5 mg by mouth 2 (two) times daily.    Yes [provider]  atorvastatin (LIPITOR) 80 MG tablet Take 40 mg by mouth daily.    Yes [provider]  carvedilol (COREG) 6.25 MG tablet Take 3.125 mg by mouth 2 (two) times daily with a meal.   Yes [provider]  clonazePAM (KLONOPIN) 2 MG tablet Take 2 mg by mouth 2 (two) times daily as needed for anxiety (sleep).    Yes [provider]  erythromycin with ethanol (ERYGEL) 2 % gel Apply topically daily. To affected area in between toes of left foot 01/28/18  Yes Price, Nolon Bussing, DPM  furosemide (LASIX) 40 MG tablet Take 1 tablet (40 mg total) by mouth daily. Patient taking differently: Take 40 mg by mouth daily as needed for fluid or edema (more than 3 lbs in a day or 5 lbs in a week.).  05/05/14  Yes Piepenbrink, Victorino Dike, PA-C  HYDROcodone-acetaminophen (NORCO/VICODIN) 5-325 MG tablet Take 1 tablet by mouth every 6 (six) hours as needed for moderate pain.    Yes [provider]  isosorbide mononitrate (IMDUR) 60 MG 24 hr tablet Take 60 mg by mouth daily.    Yes [provider]  ketoconazole (NIZORAL) 2 % cream Apply 1 fingertip amount to bottom of each foot daily. 01/28/18  Yes Park Liter, DPM  losartan (COZAAR) 25 MG tablet Take 25 mg by mouth daily.    Yes [provider]  naproxen (NAPROSYN) 375 MG tablet Take 1 tablet (375 mg total) by mouth 2 (two) times daily. Patient taking differently: Take 375 mg by mouth 2 (two) times daily as needed for mild pain.  04/07/16  Yes Felicie Morn, NP  nitroGLYCERIN (NITROSTAT) 0.4 MG SL tablet Place 0.4 mg under the tongue every 5 (five) minutes as needed for chest pain.    Yes [provider]  prazosin (MINIPRESS) 5 MG capsule Take 5 mg by mouth at bedtime. 03/29/19  Yes [provider]  pregabalin (LYRICA) 75 MG capsule Take 75 mg by mouth daily.   Yes [provider]    Allergies    Spironolactone, Lisinopril, and Propranolol  Review of Systems   Review of Systems  Constitutional: Negative for chills and fever.  HENT: Negative for ear pain and sore throat.   Eyes: Negative for pain and visual disturbance.  Respiratory: Positive for chest tightness. Negative for cough and shortness of breath.   Cardiovascular: Positive for chest pain. Negative for leg swelling.  Gastrointestinal: Negative for  abdominal pain, diarrhea, nausea and vomiting.  Endocrine: Negative.   Genitourinary: Negative for difficulty urinating, dysuria and hematuria.  Musculoskeletal: Negative for arthralgias and back pain.  Skin: Negative for color change and rash.  Allergic/Immunologic: Negative.   Neurological: Negative for dizziness, seizures, syncope and headaches.  Hematological: Negative.   Psychiatric/Behavioral: Negative.   All other systems reviewed and are negative.   Physical Exam Updated Vital Signs BP (!) 146/104   Pulse (!) 53   Temp 97.8 F (36.6 C) (Oral)   Resp 14   SpO2 (!) 87%   Physical Exam Vitals and nursing note reviewed.  Constitutional:      General: He is in acute distress.     Appearance: He  is not ill-appearing.  HENT:     Head: Normocephalic and atraumatic.  Eyes:     Extraocular Movements: Extraocular movements intact.     Pupils: Pupils are equal, round, and reactive to light.  Neck:     Vascular: No JVD.  Cardiovascular:     Rate and Rhythm: Regular rhythm. Bradycardia present.     Heart sounds: Normal heart sounds.  Pulmonary:     Effort: No respiratory distress.     Breath sounds: Normal breath sounds.     Comments: Splinting  Chest:     Chest wall: Tenderness present. No deformity or crepitus. There is no dullness to percussion.  Abdominal:     General: Bowel sounds are normal.     Palpations: Abdomen is soft.     Tenderness: There is no abdominal tenderness.     Comments: Epigastric tenderness in area closest to xyphoid process  Musculoskeletal:        General: Normal range of motion.     Cervical back: Normal range of motion and neck supple.  Skin:    General: Skin is warm.     Capillary Refill: Capillary refill takes less than 2 seconds.  Neurological:     General: No focal deficit present.     Mental Status: He is alert and oriented to person, place, and time.  Psychiatric:        Mood and Affect: Mood normal.        Behavior: Behavior  normal.     ED Results / Procedures / Treatments   Labs (all labs ordered are listed, but only abnormal results are displayed) Labs Reviewed  BASIC METABOLIC PANEL - Abnormal; Notable for the following components:      Result Value   Glucose, Bld 165 (*)    Creatinine, Ser 1.38 (*)    GFR calc non Af Amer 55 (*)    All other components within normal limits  CBC - Abnormal; Notable for the following components:   RDW 15.6 (*)    All other components within normal limits  HEPATIC FUNCTION PANEL - Abnormal; Notable for the following components:   Total Protein 6.3 (*)    Albumin 3.4 (*)    All other components within normal limits  TROPONIN I (HIGH SENSITIVITY) - Abnormal; Notable for the following components:   Troponin I (High Sensitivity) 18 (*)    All other components within normal limits  TROPONIN I (HIGH SENSITIVITY) - Abnormal; Notable for the following components:   Troponin I (High Sensitivity) 18 (*)    All other components within normal limits  SARS CORONAVIRUS 2 BY RT PCR (HOSPITAL ORDER, PERFORMED IN [facility])  PROTIME-INR  APTT  LIPASE, BLOOD  URINALYSIS, ROUTINE W REFLEX MICROSCOPIC  HIV ANTIBODY (ROUTINE TESTING W REFLEX)  BRAIN NATRIURETIC PEPTIDE  TROPONIN I (HIGH SENSITIVITY)  TROPONIN I (HIGH SENSITIVITY)    EKG EKG Interpretation  Date/Time:  Sunday January 01 2020 19:17:44 EDT Ventricular Rate:  61 PR Interval:  206 QRS Duration: 98 QT Interval:  384 QTC Calculation: 386 R Axis:   54 Text Interpretation: Atrial-sensed ventricular-paced rhythm Abnormal ECG Confirmed by Kennis Carina [phone]) on 01/01/2020 7:34:20 PM   Radiology DG Chest 2 View  Result Date: 01/01/2020 CLINICAL DATA:  SOB, chest pain EXAM: CHEST - 2 VIEW COMPARISON:  Chest radiograph 09/10/2017 FINDINGS: Stable cardiomediastinal contours left chest pacer in place. Low lung volumes. Bibasilar opacities favored to represent atelectasis. No pneumothorax or large pleural  effusion.  No acute finding in the visualized skeleton. IMPRESSION: Low lung volumes with bibasilar opacities favored to represent atelectasis. Electronically Signed   By: Emmaline Kluver M.D.   On: 01/01/2020 19:50   CT Angio Chest/Abd/Pel for Dissection W and/or Wo Contrast  Result Date: 01/01/2020 CLINICAL DATA:  Chest pain and back pain EXAM: CT ANGIOGRAPHY CHEST, ABDOMEN AND PELVIS TECHNIQUE: Non-contrast CT of the chest was initially obtained. Multidetector CT imaging through the chest, abdomen and pelvis was performed using the standard protocol during bolus administration of intravenous contrast. Multiplanar reconstructed images and MIPs were obtained and reviewed to evaluate the vascular anatomy. CONTRAST:  OMNIPAQUE IOHEXOL 350 MG/ML SOLN COMPARISON:  None. FINDINGS: CTA CHEST FINDINGS Cardiovascular: --Heart: The heart size is mildly enlarged. There is nopericardial effusion. --Aorta: There is unchanged mild aneurysmal dilatation of the ascending aorta measuring up to 4.8 cm in maximum dimension which tapers at the level of the aortic arch. There is scattered mild noncalcified aortic atherosclerotic calcification. Precontrast images show no aortic intramural hematoma. There is no blood pool, dissection or penetrating ulcer demonstrated on arterial phase postcontrast imaging. There is a conventional 3 vessel aortic arch branching pattern. The proximal arch vessels are widely patent. A left-sided ICD seen with the lead tips at the right ventricle and right atrium. --Pulmonary Arteries: Contrast timing is optimized for preferential opacification of the aorta. Within that limitation, normal central pulmonary arteries. Mediastinum/Nodes: No mediastinal, hilar or axillary lymphadenopathy. The visualized thyroid and thoracic esophageal course are unremarkable. Lungs/Pleura: Mild hazy ground-glass opacities are seen at both lung bases. No pleural effusion or pneumothorax. No focal airspace  consolidation. No focal pleural abnormality. Musculoskeletal: No chest wall abnormality. No acute osseous findings. Review of the MIP images confirms the above findings. CTA ABDOMEN AND PELVIS FINDINGS VASCULAR Aorta: Normal caliber aorta without aneurysm, dissection, vasculitis or hemodynamically significant stenosis. There is scattered aortic atherosclerosis. Celiac: No aneurysm, dissection or hemodynamically significant stenosis. Normal branching pattern SMA: Widely patent without dissection or stenosis. Renals: Single renal arteries bilaterally. No aneurysm, dissection, stenosis or evidence of fibromuscular dysplasia. IMA: Patent without abnormality. Inflow: No aneurysm, stenosis or dissection. Veins: Normal course and caliber of the major veins. Assessment is otherwise limited by the arterial dominant contrast phase. Review of the MIP images confirms the above findings. NON-VASCULAR Hepatobiliary: Normal hepatic contours and density. No visible biliary dilatation. Normal gallbladder. Pancreas: Normal contours without ductal dilatation. No peripancreatic fluid collection. Spleen: Normal arterial phase splenic enhancement pattern. Adrenals/Urinary Tract: --Adrenal glands: Normal. Kidneys: Multiple bilateral low-density lesions are seen within both kidneys the largest measuring 7 cm in the upper pole of the right kidney. No hydronephrosis.--Urinary bladder: Unremarkable. Stomach/Bowel: --Stomach/Duodenum: No hiatal hernia or other gastric abnormality. Normal duodenal course and caliber. --Small bowel: No dilatation or inflammation. --Colon: No focal abnormality. --Appendix: Normal. Lymphatic:  No abdominal or pelvic lymphadenopathy. Reproductive: No free fluid in the pelvis. Musculoskeletal. No bony spinal canal stenosis or focal osseous abnormality. Other: None. Review of the MIP images confirms the above findings. IMPRESSION: 1. No acute aortic abnormality. 2. Stable ascending aortic aneurysm measuring 4.8 cm in  transverse dimension. Ascending thoracic aortic aneurysm. Recommend semi-annual imaging followup by CTA or MRA and referral to cardiothoracic surgery if not already obtained. This recommendation follows 2010 ACCF/AHA/AATS/ACR/ASA/SCA/SCAI/SIR/STS/SVM Guidelines for the Diagnosis and Management of Patients With Thoracic Aortic Disease. Circulation. 2010; 121: W295-A213. Aortic aneurysm NOS (ICD10-I71.9) 3.  Aortic Atherosclerosis (ICD10-I70.0). 4. Minimal ground-glass opacities at both lung bases which could be due to atelectasis  and/or early infectious etiology. Electronically Signed   By: Jonna Clark M.D.   On: 01/01/2020 21:37    Procedures Procedures (including critical care time)  Medications Ordered in ED Medications  HYDROmorphone (DILAUDID) injection 1-2 mg (has no administration in time range)  furosemide (LASIX) injection 40 mg (has no administration in time range)  allopurinol (ZYLOPRIM) tablet 100 mg (has no administration in time range)  HYDROcodone-acetaminophen (NORCO/VICODIN) 5-325 MG per tablet 1 tablet (has no administration in time range)  atorvastatin (LIPITOR) tablet 40 mg (has no administration in time range)  carvedilol (COREG) tablet 3.125 mg (has no administration in time range)  isosorbide mononitrate (IMDUR) 24 hr tablet 60 mg (has no administration in time range)  losartan (COZAAR) tablet 25 mg (has no administration in time range)  prazosin (MINIPRESS) capsule 5 mg (has no administration in time range)  apixaban (ELIQUIS) tablet 5 mg (has no administration in time range)  pregabalin (LYRICA) capsule 75 mg (has no administration in time range)  erythromycin with ethanol (EMGEL) 2 % gel (has no administration in time range)  nitroGLYCERIN (NITROSTAT) SL tablet 0.4 mg (has no administration in time range)  acetaminophen (TYLENOL) tablet 650 mg (has no administration in time range)  ondansetron (ZOFRAN) injection 4 mg (has no administration in time range)   HYDROmorphone (DILAUDID) injection 1 mg (1 mg Intravenous Given 01/01/20 1937)  iohexol (OMNIPAQUE) 350 MG/ML injection 100 mL (100 mLs Intravenous Contrast Given 01/01/20 2110)  HYDROmorphone (DILAUDID) injection 1-2 mg (1 mg Intravenous Given 01/02/20 1005)    ED Course  I have reviewed the triage vital signs and the nursing notes.  Pertinent labs & imaging results that were available during my care of the patient were reviewed by me and considered in my medical decision making (see chart for details).  Clinical Course as of Jan 02 1528  Mon Jan 02, 2020  1191 [employer] report   measurements are normal.  1 episode non sustained v tach aug 25th.  Occsnl fast heart rates.  Detecting possible fluid accumalation   [JK]  1041 CT Angio Chest/Abd/Pel for Dissection W and/or Wo Contrast [RK]    Clinical Course User Index [JK] Linwood Dibbles, MD [RK] Melene Plan, MD   MDM Rules/Calculators/A&P HEAR Score: 6                        This is a 64 yo male pmhx s/f NICM w/ EF 25%, paroxysmal a fib on Eliquis, HTN, HLD who is presenting with constant, tight/squeezing midsternal chest pain that has been present since around 6 PM yesterday evening.  On physical exam, patient appears in acute distress with splinting. Patient has moderate tenderness to palpation of inferior sternum, and pain with palpation that extends bilaterally along the diaphragm.  Otherwise, patient's heart sounds are normal and there is no bilateral lower extremity edema. In EMS, patient did not respond to nitroglycerin and did not have relief of pain with 1 mg of Dilaudid in the ED last night.  Initial labs were significant for creatinine of 1.38 and GFR of 55 which is stable.  His initial troponin was 6 > 11 (0200). Patient's initial EKG did not show any ischemic process.  Patient obtain CT angio chest, abdomen, pelvis which returned with no acute aortic abnormality.  He does have a 4.8cm  transverse stable ascending aortic aneurysm and  minimal groundglass opacities in both lungs. Repeat troponin was drawn this morning at 08 30 which came back  at 18.  Lipase was 23.  Currently awaiting LFTs. Overall, work up is less concerning for ACS; however, given mild continued increase in troponin and patient's severe chest pain with otherwise negative work up, spoke to cardiology who will come down and see the patient.   Patient redosed 1-2 mg of dilaudid for pain.  1500 Checked on patient.  He continues to have chest pain.  He does report that the previous dose of Dilaudid was helpful for pain.  Will redose.  Cardiology assessed patient and have decided to admit patient to their service for possible unstable angina with possible pleuritic component.   Final Clinical Impression(s) / ED Diagnoses Final diagnoses:  Unstable angina [facility])  Pleurodynia    Rx / DC Orders ED Discharge Orders    None       Melene Plan, MD 01/02/20 1529    Linwood Dibbles, MD 01/03/20 1524

## 2020-01-02 NOTE — H&P (Signed)
Cardiology Admission History and Physical:   Patient ID: Kristopher Scott MRN: [id number]; DOB: 06-Nov-1955   Admission date: 01/01/2020  Primary Care Provider: System, Pcp Not In [facility] Cardiologist: Lenn Sink  Chief Complaint:  Chest pain   Patient Profile:   Kristopher Scott is a 64 y.o. male with history of nonischemic cardiomyopathy with EF around 25%, paroxysmal atrial fibrillation on Eliquis for anticoagulation, hypertension, hyperlipidemia and diabetes mellitus brought to ER for evaluation of chest pain.  Patient has longstanding history of nonischemic cardiomyopathy.  Cardiac cath without evidence of CAD in 2016.  Unknown last echocardiogram.  He had Med[employer] BiV ICD placement in 2016.     He was recently seen by electrophysiologist, Palamaner Elly Modena, MD on 12/05/19 for diaphragmatic stimulation at multiple vectors in LV lead and high LV threshold.   He is scheduled for outpatient echocardiogram and lead revision later this week.  History of Present Illness:   Kristopher Scott had substernal chest tightness with shortness of breath while walking with dog yesterday evening around 6 PM.  Chest pain worsened on his way back to home on incline.  It was radiating to his left shoulder and arm.  He was diaphoretic and nauseated but no vomiting.  For the Past, couple of months he does noted exertional shortness of breath but yesterday was worse episode.  He was given sublingual nitroglycerin x1 in route and diluted while in the ER without significant improvement.  Currently having 4 out of 10 chest tightness at center of his chest.  He denies palpitation or syncope.  Reports lower extremity edema, orthopnea and early satiety.  His chest tightness gets worse with deep breath and laying down.  Denies exposure to Covid.  No fever, chills or congestion.   Potassium 3.5 Serum creatinine 1.38 High-sensitivity troponin 6>>11>>18>> pending last reading Covid  negative Urine clear CT angio of the chest abdominal pelvis without acute abnormality.  However it showed 4.8 cm ascending aortic aneurysm.  Patient report hx of CAD to his mother and two elder brother.    Past Medical History:  Diagnosis Date  . Back pain   . Cardiac defibrillator in place   . Cluster headaches   . Diabetes mellitus without complication (HCC)   . Hypertension     Past Surgical History:  Procedure Laterality Date  . HYDROCELE EXCISION / REPAIR    . ROTATOR CUFF REPAIR Right 04/2012     Medications Prior to Admission: Prior to Admission medications   Medication Sig Start Date End Date Taking? Authorizing Provider  allopurinol (ZYLOPRIM) 100 MG tablet Take 100 mg by mouth daily.    Yes [provider]  Apixaban (ELIQUIS PO) Take 5 mg by mouth 2 (two) times daily.    Yes [provider]  atorvastatin (LIPITOR) 80 MG tablet Take 40 mg by mouth daily.    Yes [provider]  carvedilol (COREG) 6.25 MG tablet Take 3.125 mg by mouth 2 (two) times daily with a meal.   Yes [provider]  clonazePAM (KLONOPIN) 2 MG tablet Take 2 mg by mouth 2 (two) times daily as needed for anxiety (sleep).   Yes [provider]  erythromycin with ethanol (ERYGEL) 2 % gel Apply topically daily. To affected area in between toes of left foot 01/28/18  Yes Price, Nolon Bussing, DPM  furosemide (LASIX) 40 MG tablet Take 1 tablet (40 mg total) by mouth daily. Patient taking differently: Take 40 mg by mouth daily as needed  for fluid or edema (more than 3 lbs in a day or 5 lbs in a week.).  05/05/14  Yes Piepenbrink, Victorino Dike, PA-C  HYDROcodone-acetaminophen (NORCO/VICODIN) 5-325 MG tablet Take 1 tablet by mouth every 6 (six) hours as needed for moderate pain.    Yes [provider]  isosorbide mononitrate (IMDUR) 60 MG 24 hr tablet Take 60 mg by mouth daily.    Yes [provider]  ketoconazole (NIZORAL) 2 % cream Apply 1 fingertip amount  to bottom of each foot daily. 01/28/18  Yes Park Liter, DPM  losartan (COZAAR) 25 MG tablet Take 25 mg by mouth daily.    Yes [provider]  naproxen (NAPROSYN) 375 MG tablet Take 1 tablet (375 mg total) by mouth 2 (two) times daily. Patient taking differently: Take 375 mg by mouth 2 (two) times daily as needed for mild pain.  04/07/16  Yes Felicie Morn, NP  nitroGLYCERIN (NITROSTAT) 0.4 MG SL tablet Place 0.4 mg under the tongue every 5 (five) minutes as needed for chest pain.    Yes [provider]  prazosin (MINIPRESS) 5 MG capsule Take 5 mg by mouth at bedtime. 03/29/19  Yes [provider]  pregabalin (LYRICA) 75 MG capsule Take 75 mg by mouth daily.   Yes [provider]  clotrimazole (LOTRIMIN) 1 % cream Apply fingertip amount twice daily Patient not taking: Reported on 01/02/2020 04/08/18   Park Liter, DPM  cyclobenzaprine (FLEXERIL) 10 MG tablet Take 1 tablet (10 mg total) by mouth 2 (two) times daily as needed for muscle spasms. Patient not taking: Reported on 01/02/2020 04/07/16   Felicie Morn, NP  methocarbamol (ROBAXIN) 500 MG tablet Take 2 tablets (1,000 mg total) by mouth every 8 (eight) hours as needed for muscle spasms. Patient not taking: Reported on 01/02/2020 01/17/16   Loren Racer, MD  oxyCODONE-acetaminophen (PERCOCET/ROXICET) 5-325 MG tablet Take 1 tablet by mouth every 4 (four) hours as needed for severe pain. Patient not taking: Reported on 01/02/2020 09/11/17   Elpidio Anis, PA-C     Allergies:    Allergies  Allergen Reactions  . Spironolactone Other (See Comments)  . Lisinopril Cough  . Propranolol Itching    Social History:   Social History   Socioeconomic History  . Marital status: Married    Spouse name: Not on file  . Number of children: Not on file  . Years of education: Not on file  . Highest education level: Not on file  Occupational History  . Not on file  Tobacco Use  . Smoking status: Never Smoker   . Smokeless tobacco: Never Used  Vaping Use  . Vaping Use: Never used  Substance and Sexual Activity  . Alcohol use: Yes    Comment: socially  . Drug use: No  . Sexual activity: Yes  Other Topics Concern  . Not on file  Social History Narrative  . Not on file   Social Determinants of Health   Financial Resource Strain:   . Difficulty of Paying Living Expenses: Not on file  Food Insecurity:   . Worried About Programme researcher, broadcasting/film/video in the Last Year: Not on file  . Ran Out of Food in the Last Year: Not on file  Transportation Needs:   . Lack of Transportation (Medical): Not on file  . Lack of Transportation (Non-Medical): Not on file  Physical Activity:   . Days of Exercise per Week: Not on file  . Minutes of Exercise per Session: Not  on file  Stress:   . Feeling of Stress : Not on file  Social Connections:   . Frequency of Communication with Friends and Family: Not on file  . Frequency of Social Gatherings with Friends and Family: Not on file  . Attends Religious Services: Not on file  . Active Member of Clubs or Organizations: Not on file  . Attends Banker Meetings: Not on file  . Marital Status: Not on file  Intimate Partner Violence:   . Fear of Current or Ex-Partner: Not on file  . Emotionally Abused: Not on file  . Physically Abused: Not on file  . Sexually Abused: Not on file    Family History:  The patient's family history includes Diabetes in his mother; Hypertension in his mother.    ROS:  Please see the history of present illness.  All other ROS reviewed and negative.     Physical Exam/Data:   Vitals:   01/02/20 0057 01/02/20 0454 01/02/20 0648 01/02/20 0732  BP: 106/70 (!) 148/77 (!) 141/78 (!) 152/77  Pulse: (!) 51 (!) 52 (!) 107 (!) 51  Resp: 16 18 18 19   Temp: 97.8 F (36.6 C)     TempSrc: Oral     SpO2: 100% 100% 100% 100%   No intake or output data in the 24 hours ending 01/02/20 1340 Last 3 Weights 09/10/2017 02/02/2017 10/05/2016   Weight (lbs) 234 lb 247 lb 246 lb  Weight (kg) 106.142 kg 112.038 kg 111.585 kg     There is no height or weight on file to calculate BMI.  General:  Well nourished, well developed, in no acute distress HEENT: normal Lymph: no adenopathy Neck: no JVD Endocrine:  No thryomegaly Vascular: No carotid bruits; FA pulses 2+ bilaterally without bruits  Cardiac:  normal S1, S2; RRR; no murmur  Lungs:  Bibasilar rales Abd: soft, tender at LLQ, no hepatomegaly  Ext: Trace edema Musculoskeletal:  No deformities, BUE and BLE strength normal and equal Skin: warm and dry  Neuro:  CNs 2-12 intact, no focal abnormalities noted Psych:  Normal affect    EKG:  The ECG that was done yesterday was personally reviewed and demonstrates AV placed rhythm   Relevant CV Studies: As above  Laboratory Data:  High Sensitivity Troponin:   Recent Labs  Lab 01/01/20 1918 01/02/20 0229 01/02/20 0830 01/02/20 1124  TROPONINIHS 6 11 18* 18*      Chemistry Recent Labs  Lab 01/01/20 1918  NA 139  K 3.5  CL 107  CO2 23  GLUCOSE 165*  BUN 12  CREATININE 1.38*  CALCIUM 9.0  GFRNONAA 55*  GFRAA >60  ANIONGAP 9    Hematology Recent Labs  Lab 01/01/20 1918  WBC 4.7  RBC 4.91  HGB 14.1  HCT 42.0  MCV 85.5  MCH 28.7  MCHC 33.6  RDW 15.6*  PLT 187   Radiology/Studies:  DG Chest 2 View  Result Date: 01/01/2020 CLINICAL DATA:  SOB, chest pain EXAM: CHEST - 2 VIEW COMPARISON:  Chest radiograph 09/10/2017 FINDINGS: Stable cardiomediastinal contours left chest pacer in place. Low lung volumes. Bibasilar opacities favored to represent atelectasis. No pneumothorax or large pleural effusion. No acute finding in the visualized skeleton. IMPRESSION: Low lung volumes with bibasilar opacities favored to represent atelectasis. Electronically Signed   By: 11/10/2017 M.D.   On: 01/01/2020 19:50   CT Angio Chest/Abd/Pel for Dissection W and/or Wo Contrast  Result Date: 01/01/2020 CLINICAL DATA:   Chest pain and  back pain EXAM: CT ANGIOGRAPHY CHEST, ABDOMEN AND PELVIS TECHNIQUE: Non-contrast CT of the chest was initially obtained. Multidetector CT imaging through the chest, abdomen and pelvis was performed using the standard protocol during bolus administration of intravenous contrast. Multiplanar reconstructed images and MIPs were obtained and reviewed to evaluate the vascular anatomy. CONTRAST:  OMNIPAQUE IOHEXOL 350 MG/ML SOLN COMPARISON:  None. FINDINGS: CTA CHEST FINDINGS Cardiovascular: --Heart: The heart size is mildly enlarged. There is nopericardial effusion. --Aorta: There is unchanged mild aneurysmal dilatation of the ascending aorta measuring up to 4.8 cm in maximum dimension which tapers at the level of the aortic arch. There is scattered mild noncalcified aortic atherosclerotic calcification. Precontrast images show no aortic intramural hematoma. There is no blood pool, dissection or penetrating ulcer demonstrated on arterial phase postcontrast imaging. There is a conventional 3 vessel aortic arch branching pattern. The proximal arch vessels are widely patent. A left-sided ICD seen with the lead tips at the right ventricle and right atrium. --Pulmonary Arteries: Contrast timing is optimized for preferential opacification of the aorta. Within that limitation, normal central pulmonary arteries. Mediastinum/Nodes: No mediastinal, hilar or axillary lymphadenopathy. The visualized thyroid and thoracic esophageal course are unremarkable. Lungs/Pleura: Mild hazy ground-glass opacities are seen at both lung bases. No pleural effusion or pneumothorax. No focal airspace consolidation. No focal pleural abnormality. Musculoskeletal: No chest wall abnormality. No acute osseous findings. Review of the MIP images confirms the above findings. CTA ABDOMEN AND PELVIS FINDINGS VASCULAR Aorta: Normal caliber aorta without aneurysm, dissection, vasculitis or hemodynamically significant stenosis. There is  scattered aortic atherosclerosis. Celiac: No aneurysm, dissection or hemodynamically significant stenosis. Normal branching pattern SMA: Widely patent without dissection or stenosis. Renals: Single renal arteries bilaterally. No aneurysm, dissection, stenosis or evidence of fibromuscular dysplasia. IMA: Patent without abnormality. Inflow: No aneurysm, stenosis or dissection. Veins: Normal course and caliber of the major veins. Assessment is otherwise limited by the arterial dominant contrast phase. Review of the MIP images confirms the above findings. NON-VASCULAR Hepatobiliary: Normal hepatic contours and density. No visible biliary dilatation. Normal gallbladder. Pancreas: Normal contours without ductal dilatation. No peripancreatic fluid collection. Spleen: Normal arterial phase splenic enhancement pattern. Adrenals/Urinary Tract: --Adrenal glands: Normal. Kidneys: Multiple bilateral low-density lesions are seen within both kidneys the largest measuring 7 cm in the upper pole of the right kidney. No hydronephrosis.--Urinary bladder: Unremarkable. Stomach/Bowel: --Stomach/Duodenum: No hiatal hernia or other gastric abnormality. Normal duodenal course and caliber. --Small bowel: No dilatation or inflammation. --Colon: No focal abnormality. --Appendix: Normal. Lymphatic:  No abdominal or pelvic lymphadenopathy. Reproductive: No free fluid in the pelvis. Musculoskeletal. No bony spinal canal stenosis or focal osseous abnormality. Other: None. Review of the MIP images confirms the above findings. IMPRESSION: 1. No acute aortic abnormality. 2. Stable ascending aortic aneurysm measuring 4.8 cm in transverse dimension. Ascending thoracic aortic aneurysm. Recommend semi-annual imaging followup by CTA or MRA and referral to cardiothoracic surgery if not already obtained. This recommendation follows 2010 ACCF/AHA/AATS/ACR/ASA/SCA/SCAI/SIR/STS/SVM Guidelines for the Diagnosis and Management of Patients With Thoracic Aortic  Disease. Circulation. 2010; 121: Z610-R604. Aortic aneurysm NOS (ICD10-I71.9) 3.  Aortic Atherosclerosis (ICD10-I70.0). 4. Minimal ground-glass opacities at both lung bases which could be due to atelectasis and/or early infectious etiology. Electronically Signed   By: Jonna Clark M.D.   On: 01/01/2020 21:37    HEAR Score (for undifferentiated chest pain):  HEAR Score: 6    New York Heart Association (NYHA) Functional Class NYHA Class I  Assessment and Plan:   1. Chest pain  -  His symptoms concerning to unstable angina ? with pleuritic component. Symptoms did not resolved with SL nitro x 1 and diluted. Currently having 4/10 chest tightness which get worse with laying and cough. No fever or chills. covid negative. High-sensitivity troponin 6>>11>>18>>18  2. NICM - Long standing hx.  - mild volume overload by exam. optival interval minimally up.  - Get echo.  - Start Lasix 40mg  IV BID.   3. DM - SSI while here  4. HTN - BP minimally up - REsume home meds  5. AKI - Follow closely  6. PAF - maintaining sinus thythm.  - Continue Eliquis     Severity of Illness: The appropriate patient status for this patient is OBSERVATION. Observation status is judged to be reasonable and necessary in order to provide the required intensity of service to ensure the patient's safety. The patient's presenting symptoms, physical exam findings, and initial radiographic and laboratory data in the context of their medical condition is felt to place them at decreased risk for further clinical deterioration. Furthermore, it is anticipated that the patient will be medically stable for discharge from the hospital within 2 midnights of admission. The following factors support the patient status of observation.   " The patient's presenting symptoms include SOB and CP. " The physical exam findings include volume overload  " The initial radiographic and laboratory data are mild elevated creatinine     For  questions or updates, please contact [facility] Please consult [email or web address] for contact info under     Signed , PA  01/02/2020 1:40 PM

## 2020-01-03 ENCOUNTER — Observation Stay (HOSPITAL_COMMUNITY): Payer: No Typology Code available for payment source

## 2020-01-03 ENCOUNTER — Observation Stay (HOSPITAL_BASED_OUTPATIENT_CLINIC_OR_DEPARTMENT_OTHER): Payer: No Typology Code available for payment source

## 2020-01-03 ENCOUNTER — Inpatient Hospital Stay (HOSPITAL_COMMUNITY): Payer: No Typology Code available for payment source

## 2020-01-03 DIAGNOSIS — N179 Acute kidney failure, unspecified: Secondary | ICD-10-CM | POA: Diagnosis not present

## 2020-01-03 DIAGNOSIS — E669 Obesity, unspecified: Secondary | ICD-10-CM | POA: Diagnosis present

## 2020-01-03 DIAGNOSIS — R079 Chest pain, unspecified: Secondary | ICD-10-CM | POA: Diagnosis not present

## 2020-01-03 DIAGNOSIS — E7800 Pure hypercholesterolemia, unspecified: Secondary | ICD-10-CM | POA: Diagnosis not present

## 2020-01-03 DIAGNOSIS — G8929 Other chronic pain: Secondary | ICD-10-CM | POA: Diagnosis present

## 2020-01-03 DIAGNOSIS — I714 Abdominal aortic aneurysm, without rupture: Secondary | ICD-10-CM | POA: Diagnosis not present

## 2020-01-03 DIAGNOSIS — I110 Hypertensive heart disease with heart failure: Secondary | ICD-10-CM | POA: Diagnosis not present

## 2020-01-03 DIAGNOSIS — Z8249 Family history of ischemic heart disease and other diseases of the circulatory system: Secondary | ICD-10-CM | POA: Diagnosis not present

## 2020-01-03 DIAGNOSIS — E785 Hyperlipidemia, unspecified: Secondary | ICD-10-CM | POA: Diagnosis present

## 2020-01-03 DIAGNOSIS — I5023 Acute on chronic systolic (congestive) heart failure: Secondary | ICD-10-CM | POA: Diagnosis not present

## 2020-01-03 DIAGNOSIS — Z7984 Long term (current) use of oral hypoglycemic drugs: Secondary | ICD-10-CM | POA: Diagnosis not present

## 2020-01-03 DIAGNOSIS — R296 Repeated falls: Secondary | ICD-10-CM | POA: Diagnosis present

## 2020-01-03 DIAGNOSIS — Z833 Family history of diabetes mellitus: Secondary | ICD-10-CM | POA: Diagnosis not present

## 2020-01-03 DIAGNOSIS — I639 Cerebral infarction, unspecified: Secondary | ICD-10-CM | POA: Diagnosis not present

## 2020-01-03 DIAGNOSIS — I712 Thoracic aortic aneurysm, without rupture: Secondary | ICD-10-CM | POA: Diagnosis not present

## 2020-01-03 DIAGNOSIS — I428 Other cardiomyopathies: Secondary | ICD-10-CM | POA: Diagnosis not present

## 2020-01-03 DIAGNOSIS — I10 Essential (primary) hypertension: Secondary | ICD-10-CM | POA: Diagnosis not present

## 2020-01-03 DIAGNOSIS — I480 Paroxysmal atrial fibrillation: Secondary | ICD-10-CM

## 2020-01-03 DIAGNOSIS — G9340 Encephalopathy, unspecified: Secondary | ICD-10-CM | POA: Diagnosis not present

## 2020-01-03 DIAGNOSIS — E119 Type 2 diabetes mellitus without complications: Secondary | ICD-10-CM | POA: Diagnosis present

## 2020-01-03 DIAGNOSIS — I351 Nonrheumatic aortic (valve) insufficiency: Secondary | ICD-10-CM | POA: Diagnosis not present

## 2020-01-03 DIAGNOSIS — G4730 Sleep apnea, unspecified: Secondary | ICD-10-CM | POA: Diagnosis not present

## 2020-01-03 DIAGNOSIS — I5043 Acute on chronic combined systolic (congestive) and diastolic (congestive) heart failure: Secondary | ICD-10-CM | POA: Diagnosis not present

## 2020-01-03 DIAGNOSIS — R531 Weakness: Secondary | ICD-10-CM | POA: Diagnosis present

## 2020-01-03 DIAGNOSIS — Z20822 Contact with and (suspected) exposure to covid-19: Secondary | ICD-10-CM | POA: Diagnosis not present

## 2020-01-03 DIAGNOSIS — Z7901 Long term (current) use of anticoagulants: Secondary | ICD-10-CM | POA: Diagnosis not present

## 2020-01-03 DIAGNOSIS — Z79899 Other long term (current) drug therapy: Secondary | ICD-10-CM | POA: Diagnosis not present

## 2020-01-03 DIAGNOSIS — I200 Unstable angina: Secondary | ICD-10-CM | POA: Diagnosis present

## 2020-01-03 DIAGNOSIS — Z6833 Body mass index (BMI) 33.0-33.9, adult: Secondary | ICD-10-CM | POA: Diagnosis not present

## 2020-01-03 DIAGNOSIS — I5022 Chronic systolic (congestive) heart failure: Secondary | ICD-10-CM | POA: Insufficient documentation

## 2020-01-03 DIAGNOSIS — M5117 Intervertebral disc disorders with radiculopathy, lumbosacral region: Secondary | ICD-10-CM | POA: Diagnosis not present

## 2020-01-03 LAB — ECHOCARDIOGRAM COMPLETE
Area-P 1/2: 1.66 cm2
Calc EF: 52.7 %
Height: 73 in
P 1/2 time: 678 msec
S' Lateral: 3.85 cm
Single Plane A2C EF: 53.7 %
Single Plane A4C EF: 51.5 %
Weight: 3980.8 oz

## 2020-01-03 LAB — CBC
HCT: 43.2 % (ref 39.0–52.0)
Hemoglobin: 14.1 g/dL (ref 13.0–17.0)
MCH: 28.3 pg (ref 26.0–34.0)
MCHC: 32.6 g/dL (ref 30.0–36.0)
MCV: 86.6 fL (ref 80.0–100.0)
Platelets: 182 10*3/uL (ref 150–400)
RBC: 4.99 MIL/uL (ref 4.22–5.81)
RDW: 15.8 % — ABNORMAL HIGH (ref 11.5–15.5)
WBC: 3.5 10*3/uL — ABNORMAL LOW (ref 4.0–10.5)
nRBC: 0 % (ref 0.0–0.2)

## 2020-01-03 LAB — BASIC METABOLIC PANEL
Anion gap: 8 (ref 5–15)
BUN: 12 mg/dL (ref 8–23)
CO2: 29 mmol/L (ref 22–32)
Calcium: 8.8 mg/dL — ABNORMAL LOW (ref 8.9–10.3)
Chloride: 101 mmol/L (ref 98–111)
Creatinine, Ser: 1.24 mg/dL (ref 0.61–1.24)
GFR calc Af Amer: >60 mL/min (ref >60)
GFR calc non Af Amer: >60 mL/min (ref >60)
Glucose, Bld: 129 mg/dL — ABNORMAL HIGH (ref 70–99)
Potassium: 3.3 mmol/L — ABNORMAL LOW (ref 3.5–5.1)
Sodium: 138 mmol/L (ref 135–145)

## 2020-01-03 LAB — GLUCOSE, CAPILLARY
Glucose-Capillary: 128 mg/dL — ABNORMAL HIGH (ref 70–99)
Glucose-Capillary: 143 mg/dL — ABNORMAL HIGH (ref 70–99)
Glucose-Capillary: 117 mg/dL — ABNORMAL HIGH (ref 70–99)
Glucose-Capillary: 138 mg/dL — ABNORMAL HIGH (ref 70–99)
Glucose-Capillary: 149 mg/dL — ABNORMAL HIGH (ref 70–99)

## 2020-01-03 LAB — HEPATIC FUNCTION PANEL
ALT: 23 U/L (ref 0–44)
AST: 22 U/L (ref 15–41)
Albumin: 3.5 g/dL (ref 3.5–5.0)
Alkaline Phosphatase: 60 U/L (ref 38–126)
Bilirubin, Direct: 0.1 mg/dL (ref 0.0–0.2)
Indirect Bilirubin: 0.7 mg/dL (ref 0.3–0.9)
Total Bilirubin: 0.8 mg/dL (ref 0.3–1.2)
Total Protein: 6.7 g/dL (ref 6.5–8.1)

## 2020-01-03 LAB — TROPONIN I (HIGH SENSITIVITY): Troponin I (High Sensitivity): 13 ng/L (ref <18)

## 2020-01-03 MED ORDER — FUROSEMIDE 40 MG PO TABS
40.0000 mg | ORAL_TABLET | Freq: Every day | ORAL | Status: DC
  Administered 2020-01-04 – 2020-01-05 (×2): 40 mg via ORAL
  Filled 2020-01-03 (×2): qty 1

## 2020-01-03 MED ORDER — IOHEXOL 350 MG/ML SOLN
100.0000 mL | Freq: Once | INTRAVENOUS | Status: AC | PRN
  Administered 2020-01-03: 100 mL via INTRAVENOUS

## 2020-01-03 MED ORDER — SACUBITRIL-VALSARTAN 24-26 MG PO TABS
1.0000 | ORAL_TABLET | Freq: Two times a day (BID) | ORAL | Status: DC
  Administered 2020-01-03 – 2020-01-07 (×8): 1 via ORAL
  Filled 2020-01-03 (×10): qty 1

## 2020-01-03 MED ORDER — STROKE: EARLY STAGES OF RECOVERY BOOK
Freq: Once | Status: AC
  Filled 2020-01-03 (×2): qty 1

## 2020-01-03 MED ORDER — ALUM & MAG HYDROXIDE-SIMETH 200-200-20 MG/5ML PO SUSP
30.0000 mL | ORAL | Status: DC | PRN
  Administered 2020-01-03: 30 mL via ORAL
  Filled 2020-01-03: qty 30

## 2020-01-03 MED ORDER — POTASSIUM CHLORIDE CRYS ER 20 MEQ PO TBCR
40.0000 meq | EXTENDED_RELEASE_TABLET | Freq: Two times a day (BID) | ORAL | Status: AC
  Administered 2020-01-03 (×2): 40 meq via ORAL
  Filled 2020-01-03 (×2): qty 2

## 2020-01-03 NOTE — Consult Note (Signed)
NEUROLOGY CONSULTATION NOTE   Date of service: January 03, 2020 Patient Name: Kristopher Scott MRN:  [id number] DOB:  29-Aug-1955 Reason for consult: "Stroke code"  History of Present Illness  ARLIS YALE is a 64 y.o. male with PMH significant for NICM, pAfibb s/p ICD on Eliquis, DM2, HTN, HLD who is admitted for evaluation for Chest pain.  He was noted to have acute onset L sided weakness today with a LKW of 0930 on 01/03/20.    CTH was negative for a large hypodensity concerning for a large territory infarct or hyperdensity concerning for an ICH. CTA with no LVO, CTP with no mismatch.  He has significant pain in the L arm and chest even with passive movement of his Left arm. Reports that this is moving to his R upper quadrant.  NIHSS: 4 for L arm drift and L leg weakness MRS: 0 at baseline TPA: He takes Eliquis BID and took his last dose at 4pm yesterday. This excluded him from getting tPA. Thrombectomy: Not a candidate given no LVO.   ROS   Unable to obtain a detailed ROS given the acuity of the situation. Endorses chest and L arm pain to passive movements and palpation.  Past History   Past Medical History:  Diagnosis Date  . Back pain   . Cardiac defibrillator in place   . Cluster headaches   . Diabetes mellitus without complication (HCC)   . Hypertension    Past Surgical History:  Procedure Laterality Date  . HYDROCELE EXCISION / REPAIR    . ROTATOR CUFF REPAIR Right 04/2012   Family History  Problem Relation Age of Onset  . Hypertension Mother   . Diabetes Mother    Social History   Socioeconomic History  . Marital status: Married    Spouse name: Not on file  . Number of children: Not on file  . Years of education: Not on file  . Highest education level: Not on file  Occupational History  . Not on file  Tobacco Use  . Smoking status: Never Smoker  . Smokeless tobacco: Never Used  Vaping Use  . Vaping Use: Never used  Substance and Sexual Activity   . Alcohol use: Yes    Comment: socially  . Drug use: No  . Sexual activity: Yes  Other Topics Concern  . Not on file  Social History Narrative  . Not on file   Social Determinants of Health   Financial Resource Strain:   . Difficulty of Paying Living Expenses: Not on file  Food Insecurity:   . Worried About Programme researcher, broadcasting/film/video in the Last Year: Not on file  . Ran Out of Food in the Last Year: Not on file  Transportation Needs:   . Lack of Transportation (Medical): Not on file  . Lack of Transportation (Non-Medical): Not on file  Physical Activity:   . Days of Exercise per Week: Not on file  . Minutes of Exercise per Session: Not on file  Stress:   . Feeling of Stress : Not on file  Social Connections:   . Frequency of Communication with Friends and Family: Not on file  . Frequency of Social Gatherings with Friends and Family: Not on file  . Attends Religious Services: Not on file  . Active Member of Clubs or Organizations: Not on file  . Attends Banker Meetings: Not on file  . Marital Status: Not on file   Allergies  Allergen Reactions  . Spironolactone  Other (See Comments)  . Lisinopril Cough  . Propranolol Itching    Medications   Medications Prior to Admission  Medication Sig Dispense Refill Last Dose  . allopurinol (ZYLOPRIM) 100 MG tablet Take 100 mg by mouth daily.    01/01/2020 at Unknown time  . Apixaban (ELIQUIS PO) Take 5 mg by mouth 2 (two) times daily.    01/01/2020 at 01130  . atorvastatin (LIPITOR) 80 MG tablet Take 40 mg by mouth daily.    01/01/2020 at Unknown time  . carvedilol (COREG) 6.25 MG tablet Take 3.125 mg by mouth 2 (two) times daily with a meal.   01/01/2020 at 01130  . clonazePAM (KLONOPIN) 2 MG tablet Take 2 mg by mouth 2 (two) times daily as needed for anxiety (sleep).   01/01/2020 at Unknown time  . erythromycin with ethanol (ERYGEL) 2 % gel Apply topically daily. To affected area in between toes of left foot 30 g 0 Past Week  at Unknown time  . furosemide (LASIX) 40 MG tablet Take 1 tablet (40 mg total) by mouth daily. (Patient taking differently: Take 40 mg by mouth daily as needed for fluid or edema (more than 3 lbs in a day or 5 lbs in a week.). ) 15 tablet 0 01/01/2020 at Unknown time  . HYDROcodone-acetaminophen (NORCO/VICODIN) 5-325 MG tablet Take 1 tablet by mouth every 6 (six) hours as needed for moderate pain.    unk  . isosorbide mononitrate (IMDUR) 60 MG 24 hr tablet Take 60 mg by mouth daily.    12/31/2019  . ketoconazole (NIZORAL) 2 % cream Apply 1 fingertip amount to bottom of each foot daily. 30 g 0 Past Week at Unknown time  . losartan (COZAAR) 25 MG tablet Take 25 mg by mouth daily.    01/01/2020 at Unknown time  . naproxen (NAPROSYN) 375 MG tablet Take 1 tablet (375 mg total) by mouth 2 (two) times daily. (Patient taking differently: Take 375 mg by mouth 2 (two) times daily as needed for mild pain. ) 20 tablet 0 01/01/2020 at Unknown time  . nitroGLYCERIN (NITROSTAT) 0.4 MG SL tablet Place 0.4 mg under the tongue every 5 (five) minutes as needed for chest pain.    01/01/2020 at Unknown time  . prazosin (MINIPRESS) 5 MG capsule Take 5 mg by mouth at bedtime.   12/31/2019  . pregabalin (LYRICA) 75 MG capsule Take 75 mg by mouth daily.   01/01/2020 at Unknown time     Vitals  Temp:  [97.8 F (36.6 C)-98.4 F (36.9 C)] 97.9 F (36.6 C) (08/31 0758) Pulse Rate:  [48-103] 68 (08/31 0758) Resp:  [14-22] 18 (08/31 0758) BP: (104-164)/(62-104) 130/85 (08/31 0758) SpO2:  [87 %-100 %] 100 % (08/31 0758) Weight:  [112.9 kg] 112.9 kg (08/31 0204)  Body mass index is 32.83 kg/m.  Physical Exam   General: Laying comfortably in bed; in no acute distress.  HENT: Normal oropharynx and mucosa. Normal external appearance of ears and nose. Neck: Supple, no pain or tenderness CV: No JVD. No peripheral edema. Pulmonary: Symmetric Chest rise. Normal respiratory effort. Abdomen: Soft to touch, non-tender Ext: No  cyanosis, edema, or deformity  Skin: No rash. Normal palpation of skin.   Musculoskeletal: Normal digits and nails by inspection. No clubbing.  Neurologic Examination  Mental status/Cognition: appears somewhat encephalopathic. Oriented to self, place, month and year, poor attention. Able to do simple calculations. Speech/language: Fluent, comprehension intact, object naming intact, repetition intact. Cranial nerves:   CN II Pupils  equal and reactive to light, no VF deficits   CN III,IV,VI EOM intact, no gaze preference or deviation, no nystagmus   CN V decrased to touch in L face.   CN VII no asymmetry, no nasolabial fold flattening   CN VIII normal hearing to speech   CN IX & X normal palatal elevation, no uvular deviation   CN XI 5/5 head turn and 5/5 shoulder shrug bilaterally   CN XII midline tongue protrusion   Motor:  Muscle bulk: normal, tone normal, pronator drift LUE drift. Mvmt Root Nerve  Muscle Right Left Comments  SA C5/6 Ax Deltoid 5 2   EF C5/6 Mc Biceps 5 4+   EE C6/7/8 Rad Triceps 5 4+   WF C6/7 Med FCR 5 4   WE C7/8 PIN ECU 5 4   F Ab C8/T1 U ADM/FDI 5 4+   HF L1/2/3 Fem Illopsoas 5 3   KE L2/3/4 Fem Quad 5 4   DF L4/5 D Peron Tib Ant 5 4   PF S1/2 Tibial Grc/Sol 5 4    Reflexes:  Right Left Comments  Pectoralis 1 1    Biceps (C5/6) 1 1   Brachioradialis (C5/6) 1 1    Triceps (C6/7) 1 1    Patellar (L3/4) 1 1    Achilles (S1) 1 1    Hoffman      Plantar     Jaw jerk    Sensation:  Light touch Decreased in L face, L arm and L leg   Pin prick Decreased in L face, L arm and L leg   Temperature    Vibration   Proprioception    Coordination/Complex Motor:  - Finger to Nose intact on the R - Heel to shin intact on the R - Rapid alternating movement intact on the R. - Gait: did not assess.  Labs   Lab Results  Component Value Date   NA 138 01/03/2020   K 3.3 (L) 01/03/2020   CL 101 01/03/2020   CO2 29 01/03/2020   GLUCOSE 129 (H) 01/03/2020    BUN 12 01/03/2020   CREATININE 1.24 01/03/2020   CALCIUM 8.8 (L) 01/03/2020   ALBUMIN 3.4 (L) 01/02/2020   AST 21 01/02/2020   ALT 20 01/02/2020   ALKPHOS 53 01/02/2020   BILITOT 1.0 01/02/2020   GFRNONAA >60 01/03/2020   GFRAA >60 01/03/2020     Imaging and Diagnostic studies  CTH was negative for a large hypodensity concerning for a large territory infarct or hyperdensity concerning for an ICH. CTA: No LVO CTP: No mismatch  Impression   FREMON ZACHARIA is a 64 y.o. male with PMH significant for NICM, pAfibb s/p ICD on Eliquis, DM2, HTN, HLD who is admitted for evaluation for Chest pain. Noted to have acute onset L sided weakness with a NIHSS of 4 and LKW of 0930. Not a candidate for tPA given eliquis in the last 24 hours. Not a candidate for thrombectomy given no LVO. The acute onset of his symptoms does seem to suggest that this might be a CVA, he does have significant risk factors including Afibb. Based on negative CTH, CTA, and no mismatch on CTP, my suspicion for a large territory stroke is low. Althou CT is not sensitive for a small stroke. He is unable to get an MRI due to his defibb. We will get a repeat CTH in 24 hours to assess for any strokes.  Recommendations   - Brain imaging- MRI Brain -  unable to get - Vascular imaging- CTA Brain/Neck no LVO - CTP: no mismatch. - Repeat CTH in 24 hours - ordered. - TTE - pending - Lipid panel - ordered  - Statin - Continue home Atorvastatin 80mg  daily - A1C - ordered - Antithrombotic - none, he is Eliquis 5mg  BID. - Anticoagulation: continue Eliquis 5mg  BID - DVT ppx - none, he is on Eliquis - SBP goal - permissive hypertension first 24 h < 220/110. Hold home meds.  - Telemetry monitoring for arrythmia- ordered - Swallow screen - Stroke education - PT/OT/SLP consult - given encephalopathy, some suspicion that the narcotics might be contributing to his presentation. Discussed with primary team nd with patient. Primary team  to attempt to cut down on narcotics.  ______________________________________________________________________   Thank you for the opportunity to take part in the care of this patient. If you have any further questions, please contact the neurology consultation attending.  Signed,  Erick Blinks [facility] Pager Number [phone]

## 2020-01-03 NOTE — Significant Event (Signed)
Rapid Response Event Note   Reason for Call :  Left sided weakness, chest pain, shortness of breath  Initial Focused Assessment:  Pt lying in bed. Oriented, lethargic. Lung sounds are clear, SpO2 98% on 2LNC. Pt endorsing 10/10 ripping chest pain to the center of his chest- chest pain worsened when lying flat. Heart sounds are faint. Lungs are clear. Pt endorses pain to his right upper quadrant and right lower quadrant, pain increases with palpation. Initial NIH 12, see flowsheet. NIH scored for left partial gaze palsy, left hemianopia, left facial weakness, left arm weakness, bilateral leg weakness (L>R), dysarthria, and left sided inattention. While in CT, NIH improved to 4.   VS: T 98.3F, BP 119/81, HR 65, RR 16, SpO2 100% on 2LNC CBG: 117  Interventions:  -EKG provided to Dr. Duke Salvia -Code stroke called: CT, CTP completed -ECHO -Provider notified of chest pain  Plan of Care:  -Q2H mNIHSS  -MRI brain -Revaluate chest pain, follow up with provider if pain persists  Call rapid response for additional needs.  Event Summary:  MD Notified: Dr. Duke Salvia Call Time: [phone] Arrival Time: 1000 End Time: 1130  Jennye Moccasin, RN

## 2020-01-03 NOTE — Progress Notes (Addendum)
Progress Note  Patient Name: Kristopher Scott Date of Encounter: 01/03/2020  [facility] Cardiologist: No primary care provider on file.   Subjective   Called to evaluate patient for code stroke.  He was noted to have left-sided weakness.  He reported acute onset of shortness of breath.  He denies any chest pain.  Inpatient Medications    Scheduled Meds: . allopurinol  100 mg Oral Daily  . apixaban  5 mg Oral BID  . atorvastatin  40 mg Oral Daily  . carvedilol  3.125 mg Oral BID WC  . erythromycin with ethanol   Topical Daily  . furosemide  40 mg Intravenous BID  . isosorbide mononitrate  60 mg Oral Daily  . losartan  25 mg Oral Daily  . prazosin  5 mg Oral QHS  . pregabalin  75 mg Oral Daily   Continuous Infusions:  PRN Meds: acetaminophen, alum & mag hydroxide-simeth, HYDROcodone-acetaminophen, HYDROmorphone (DILAUDID) injection, nitroGLYCERIN, ondansetron (ZOFRAN) IV   Vital Signs    Vitals:   01/03/20 0049 01/03/20 0204 01/03/20 0552 01/03/20 0758  BP: 104/68  109/62 130/85  Pulse: (!) 56  60 68  Resp: 17  17 18   Temp: 97.8 F (36.6 C)  98.4 F (36.9 C) 97.9 F (36.6 C)  TempSrc: Oral  Oral Oral  SpO2: 100%  100% 100%  Weight:  112.9 kg    Height:        Intake/Output Summary (Last 24 hours) at 01/03/2020 1046 Last data filed at 01/03/2020 0900 Gross per 24 hour  Intake 480 ml  Output 1850 ml  Net -1370 ml   Last 3 Weights 01/03/2020 09/10/2017 02/02/2017  Weight (lbs) 248 lb 12.8 oz 234 lb 247 lb  Weight (kg) 112.855 kg 106.142 kg 112.038 kg      Telemetry    Sinus rhythm - Personally Reviewed  ECG    A sensed V paced.  Rate 67 bpm. - Personally Reviewed  Physical Exam   VS:  BP 130/85   Pulse 68   Temp 97.9 F (36.6 C) (Oral)   Resp 18   Ht 6\' 1"  (1.854 m)   Wt 112.9 kg   SpO2 100%   BMI 32.83 kg/m  , BMI Body mass index is 32.83 kg/m. GENERAL:  Ill-appearing HEENT: Pupils equal round and reactive, fundi not visualized, oral  mucosa unremarkable NECK:  No jugular venous distention, waveform within normal limits, carotid upstroke brisk and symmetric, no bruits LUNGS:  Clear to auscultation bilaterally HEART:  RRR.  PMI not displaced or sustained,S1 and S2 within normal limits, no S3, no S4, no clicks, no rubs, II/VI systolic murmur ABD:  Flat, positive bowel sounds normal in frequency in pitch, no bruits, no rebound, no guarding, no midline pulsatile mass, no hepatomegaly, no splenomegaly CHEST: +chest wall tenderness to palpation EXT:  2 plus pulses throughout, no edema, no cyanosis no clubbing SKIN:  No rashes no nodules NEURO: L UE weakness PSYCH:  Cognitively intact, oriented to person place and time  Labs    High Sensitivity Troponin:   Recent Labs  Lab 01/01/20 1918 01/02/20 0229 01/02/20 0830 01/02/20 1124  TROPONINIHS 6 11 18* 18*      Chemistry Recent Labs  Lab 01/01/20 1918 01/02/20 1124 01/03/20 0624  NA 139  --  138  K 3.5  --  3.3*  CL 107  --  101  CO2 23  --  29  GLUCOSE 165*  --  129*  BUN 12  --  12  CREATININE 1.38*  --  1.24  CALCIUM 9.0  --  8.8*  PROT  --  6.3*  --   ALBUMIN  --  3.4*  --   AST  --  21  --   ALT  --  20  --   ALKPHOS  --  53  --   BILITOT  --  1.0  --   GFRNONAA 55*  --  >60  GFRAA >60  --  >60  ANIONGAP 9  --  8     Hematology Recent Labs  Lab 01/01/20 1918 01/03/20 0624  WBC 4.7 3.5*  RBC 4.91 4.99  HGB 14.1 14.1  HCT 42.0 43.2  MCV 85.5 86.6  MCH 28.7 28.3  MCHC 33.6 32.6  RDW 15.6* 15.8*  PLT 187 182    BNP Recent Labs  Lab 01/02/20 2025  BNP 137.9*     DDimer No results for input(s): DDIMER in the last 168 hours.   Radiology    DG Chest 2 View  Result Date: 01/01/2020 CLINICAL DATA:  SOB, chest pain EXAM: CHEST - 2 VIEW COMPARISON:  Chest radiograph 09/10/2017 FINDINGS: Stable cardiomediastinal contours left chest pacer in place. Low lung volumes. Bibasilar opacities favored to represent atelectasis. No pneumothorax or  large pleural effusion. No acute finding in the visualized skeleton. IMPRESSION: Low lung volumes with bibasilar opacities favored to represent atelectasis. Electronically Signed   By: Emmaline Kluver M.D.   On: 01/01/2020 19:50   CT Angio Chest/Abd/Pel for Dissection W and/or Wo Contrast  Result Date: 01/01/2020 CLINICAL DATA:  Chest pain and back pain EXAM: CT ANGIOGRAPHY CHEST, ABDOMEN AND PELVIS TECHNIQUE: Non-contrast CT of the chest was initially obtained. Multidetector CT imaging through the chest, abdomen and pelvis was performed using the standard protocol during bolus administration of intravenous contrast. Multiplanar reconstructed images and MIPs were obtained and reviewed to evaluate the vascular anatomy. CONTRAST:  OMNIPAQUE IOHEXOL 350 MG/ML SOLN COMPARISON:  None. FINDINGS: CTA CHEST FINDINGS Cardiovascular: --Heart: The heart size is mildly enlarged. There is nopericardial effusion. --Aorta: There is unchanged mild aneurysmal dilatation of the ascending aorta measuring up to 4.8 cm in maximum dimension which tapers at the level of the aortic arch. There is scattered mild noncalcified aortic atherosclerotic calcification. Precontrast images show no aortic intramural hematoma. There is no blood pool, dissection or penetrating ulcer demonstrated on arterial phase postcontrast imaging. There is a conventional 3 vessel aortic arch branching pattern. The proximal arch vessels are widely patent. A left-sided ICD seen with the lead tips at the right ventricle and right atrium. --Pulmonary Arteries: Contrast timing is optimized for preferential opacification of the aorta. Within that limitation, normal central pulmonary arteries. Mediastinum/Nodes: No mediastinal, hilar or axillary lymphadenopathy. The visualized thyroid and thoracic esophageal course are unremarkable. Lungs/Pleura: Mild hazy ground-glass opacities are seen at both lung bases. No pleural effusion or pneumothorax. No focal  airspace consolidation. No focal pleural abnormality. Musculoskeletal: No chest wall abnormality. No acute osseous findings. Review of the MIP images confirms the above findings. CTA ABDOMEN AND PELVIS FINDINGS VASCULAR Aorta: Normal caliber aorta without aneurysm, dissection, vasculitis or hemodynamically significant stenosis. There is scattered aortic atherosclerosis. Celiac: No aneurysm, dissection or hemodynamically significant stenosis. Normal branching pattern SMA: Widely patent without dissection or stenosis. Renals: Single renal arteries bilaterally. No aneurysm, dissection, stenosis or evidence of fibromuscular dysplasia. IMA: Patent without abnormality. Inflow: No aneurysm, stenosis or dissection. Veins: Normal course and caliber of the major veins. Assessment is otherwise  limited by the arterial dominant contrast phase. Review of the MIP images confirms the above findings. NON-VASCULAR Hepatobiliary: Normal hepatic contours and density. No visible biliary dilatation. Normal gallbladder. Pancreas: Normal contours without ductal dilatation. No peripancreatic fluid collection. Spleen: Normal arterial phase splenic enhancement pattern. Adrenals/Urinary Tract: --Adrenal glands: Normal. Kidneys: Multiple bilateral low-density lesions are seen within both kidneys the largest measuring 7 cm in the upper pole of the right kidney. No hydronephrosis.--Urinary bladder: Unremarkable. Stomach/Bowel: --Stomach/Duodenum: No hiatal hernia or other gastric abnormality. Normal duodenal course and caliber. --Small bowel: No dilatation or inflammation. --Colon: No focal abnormality. --Appendix: Normal. Lymphatic:  No abdominal or pelvic lymphadenopathy. Reproductive: No free fluid in the pelvis. Musculoskeletal. No bony spinal canal stenosis or focal osseous abnormality. Other: None. Review of the MIP images confirms the above findings. IMPRESSION: 1. No acute aortic abnormality. 2. Stable ascending aortic aneurysm measuring  4.8 cm in transverse dimension. Ascending thoracic aortic aneurysm. Recommend semi-annual imaging followup by CTA or MRA and referral to cardiothoracic surgery if not already obtained. This recommendation follows 2010 ACCF/AHA/AATS/ACR/ASA/SCA/SCAI/SIR/STS/SVM Guidelines for the Diagnosis and Management of Patients With Thoracic Aortic Disease. Circulation. 2010; 121: K742-V956. Aortic aneurysm NOS (ICD10-I71.9) 3.  Aortic Atherosclerosis (ICD10-I70.0). 4. Minimal ground-glass opacities at both lung bases which could be due to atelectasis and/or early infectious etiology. Electronically Signed   By: Jonna Clark M.D.   On: 01/01/2020 21:37   CT HEAD CODE STROKE WO CONTRAST  Result Date: 01/03/2020 CLINICAL DATA:  Code stroke.  Neuro deficit. EXAM: CT HEAD WITHOUT CONTRAST TECHNIQUE: Contiguous axial images were obtained from the base of the skull through the vertex without intravenous contrast. COMPARISON:  CT head 01/25/2013 FINDINGS: Brain: No evidence of acute infarction, hemorrhage, hydrocephalus, extra-axial collection or mass lesion/mass effect. Vascular: No hyperdense vessel or unexpected calcification. Calcific intracranial atherosclerosis. Skull: Normal. Negative for fracture or focal lesion. Sinuses/Orbits: No acute finding. Other: No mastoid effusions. ASPECTS ([place] Stroke Program Early CT Score):10 IMPRESSION: 1. WNo acute hemorrhage or evidence of large vascular territory infarct. 2. ASPECTS is 10 Code stroke imaging results were communicated on 01/03/2020 at 10:38 am to provider Dr. Derry Lory Via telephone, who verbally acknowledged these results. Electronically Signed   By: Feliberto Harts MD   On: 01/03/2020 10:42    Cardiac Studies   Echo pending  Patient Profile     Mr. Hasten is a 71M with chronic systolic and diastolic heart failure (NICM), paroxysmal atrial fibrillation, s/p ICD, hypertension, hyperlipidemia and diabetes admitted with chest pain.   Assessment & Plan    # L  sided weakness:  I was unable to personally perform a neuro exam.  He was being whisk away to CT.  He has difficulty raising his left arm.  # NICM: # chronic systolic and diastolic heart failure: # s/p ICD:  Mr. Parkison was mildly volume overloaded on initial presentation.  His OptiVol levels were increasing on his ICD.  He is diuresing and renal failure is stable on IV Lasix.  BNP was only mildly elevated to 138.  Continue carvedilol, losartan, and Imdur.  # Atypical chest pain: His chest pain is very atypical and he is quite tender to palpation on exam.  Overall I still do not think his presentation is consistent with ACS.  High-sensitivity troponin is minimally elevated to 18 and flat.  Unable to interpret his EKG due to ventricular pacing.  He has a history of nonischemic cardiomyopathy and reportedly had a normal cath in the [place] [facility].  We are working to get those records.  We will consider getting a Lexiscan Myoview pending those results.  It is more likely that his chest pain is 2/2 MSK pain or possibly GERD.  # Essential hypertension:  Blood pressure well-controlled on carvedilol, losartan and Imdur.  # PAF:  Currently in sinus rhythm.  Continue carvedilol and Eliquis.   # Hyperlipidemia: Continue statin.  Time spent: 45 minutes-Greater than 50% of this time was spent in counseling, explanation of diagnosis, planning of further management, and coordination of care.   For questions or updates, please contact [facility] Please consult [email or web address] for contact info under        Signed, Chilton Si, MD  01/03/2020, 10:46 AM

## 2020-01-03 NOTE — Progress Notes (Signed)
Heart Failure Stewardship Pharmacist Progress Note   PCP: System, Pcp Not In PCP-Cardiologist: No primary care provider on file.    HPI:  64 yo M with PMH of NICM, paroxysmal afib on Eliquis, HTN, HLD, and diabetes. He presented to [facility] ED on 8/29 with chest pain. He reported exertional shortness of breath, orthopnea, and LE edema. His BNP was mildly elevated at 137.9. An ECHO was done on 01/03/20 showing LVEF of 40-45% (previously noted to be as low as 25% warranting BiV ICD placement in 2016).  Current HF Medications: Furosemide 40 mg IV BID Carvedilol 3.125 mg BID Losartan 25 mg daily Imdur 60 mg daily  Prior to admission HF Medications: Furosemide 40 mg PRN Carvedilol 3.125 mg BID Losartan 25 mg daily Imdur 60 mg daily  Pertinent Lab Values:  Serum creatinine 1.24, BUN 12, Potassium 3.3, Sodium 138, BNP 137.9  Vital Signs:  Weight: 248 lbs (admission weight: not collected)  Blood pressure: 130/80s   Heart rate: 50-70s   Medication Assistance / Insurance Benefits Check: Does the patient have prescription insurance? Yes Type of insurance plan: VA  Does the patient qualify for medication assistance through manufacturers or grants?   Yes  Eligible grants and/or patient assistance programs: pending  Medication assistance applications in progress: none   Medication assistance applications approved: none Approved medication assistance renewals will be completed by: TBD  Outpatient Pharmacy:  Prior to admission outpatient pharmacy: [facility] Is the patient willing to use [facility] TOC pharmacy at discharge? Pending Is the patient willing to transition their outpatient pharmacy to utilize a [facility] outpatient pharmacy?   Pending    Assessment: 1. Acute on chronic systolic CHF (EF 76-73%), due to NICM. NYHA class II symptoms. - Continue furosemide 40 mg IV BID - Conitnue carvedilol 3.125 mg BID - On losartan 25 mg daily - consider switching to Entresto - Continue Imdur  60 mg daily - Allergy noted to spironolactone (no comment on reaction). Will clarify with patient to see if we can re-challenge during hospitalization - Consider starting Farxiga prior to discharge   Plan: 1) Medication changes recommended at this time: - Stop losartan - Start Entresto 24/26 mg BID  2) Patient assistance application(s): - None pending  3)  Education  - To be completed prior to discharge  Sharen Hones, PharmD, BCPS Heart Failure Stewardship Pharmacist Phone [phone]

## 2020-01-03 NOTE — Progress Notes (Signed)
Received to room 3W26 from 3E via bed. Oriented to room, bed and unit. In no acute distress.

## 2020-01-03 NOTE — Progress Notes (Signed)
While at bedside having a conversation with patient, he had sudden onset shortness of breath and difficulty expressing needs. Neuro assessment completed and patient with noticeable left sided weakness, left sided visual field neglect and slowness to respond. Vital signs stable.  Rapid response nurse called with concerns of code stroke

## 2020-01-03 NOTE — Progress Notes (Signed)
  Echocardiogram 2D Echocardiogram has been performed.  Janalyn Harder 01/03/2020, 12:05 PM

## 2020-01-04 ENCOUNTER — Inpatient Hospital Stay (HOSPITAL_COMMUNITY): Payer: No Typology Code available for payment source

## 2020-01-04 LAB — BASIC METABOLIC PANEL
Anion gap: 12 (ref 5–15)
BUN: 14 mg/dL (ref 8–23)
CO2: 22 mmol/L (ref 22–32)
Calcium: 9.1 mg/dL (ref 8.9–10.3)
Chloride: 102 mmol/L (ref 98–111)
Creatinine, Ser: 1.32 mg/dL — ABNORMAL HIGH (ref 0.61–1.24)
GFR calc Af Amer: >60 mL/min (ref >60)
GFR calc non Af Amer: 58 mL/min — ABNORMAL LOW (ref >60)
Glucose, Bld: 159 mg/dL — ABNORMAL HIGH (ref 70–99)
Potassium: 3.9 mmol/L (ref 3.5–5.1)
Sodium: 136 mmol/L (ref 135–145)

## 2020-01-04 LAB — LIPID PANEL
Cholesterol: 164 mg/dL (ref 0–200)
HDL: 43 mg/dL (ref >40)
LDL Cholesterol: 99 mg/dL (ref 0–99)
Total CHOL/HDL Ratio: 3.8 RATIO
Triglycerides: 108 mg/dL (ref <150)
VLDL: 22 mg/dL (ref 0–40)

## 2020-01-04 LAB — HEMOGLOBIN A1C
Hgb A1c MFr Bld: 7.5 % — ABNORMAL HIGH (ref 4.8–5.6)
Mean Plasma Glucose: 168.55 mg/dL

## 2020-01-04 LAB — SEDIMENTATION RATE: Sed Rate: 3 mm/hr (ref 0–16)

## 2020-01-04 MED ORDER — LIDOCAINE 5 % EX PTCH
1.0000 | MEDICATED_PATCH | CUTANEOUS | Status: DC
  Administered 2020-01-04 – 2020-01-07 (×4): 1 via TRANSDERMAL
  Filled 2020-01-04 (×4): qty 1

## 2020-01-04 MED ORDER — ATORVASTATIN CALCIUM 80 MG PO TABS
80.0000 mg | ORAL_TABLET | Freq: Every day | ORAL | Status: DC
  Administered 2020-01-04 – 2020-01-07 (×4): 80 mg via ORAL
  Filled 2020-01-04 (×3): qty 1

## 2020-01-04 NOTE — Progress Notes (Signed)
SLP Cancellation Note  Patient Details Name: Kristopher Scott MRN: [id number] DOB: Sep 04, 1955   Cancelled treatment:       Reason Eval/Treat Not Completed: Patient at procedure or test/unavailable. Pt currently with PT. Will continue efforts.  Chamille Werntz B. Murvin Natal, [facility], CCC-SLP Speech Language Pathologist Office: [phone]  Leigh Aurora 01/04/2020, 8:56 AM

## 2020-01-04 NOTE — Progress Notes (Signed)
Returned from CT.

## 2020-01-04 NOTE — Discharge Instructions (Addendum)

## 2020-01-04 NOTE — Evaluation (Signed)
Speech Language Pathology Evaluation Patient Details Name: Kristopher Scott MRN: [id number] DOB: March 06, 1956 Today's Date: 01/04/2020 Time: 4982-6415 SLP Time Calculation (min) (ACUTE ONLY): 25 min  Problem List:  Patient Active Problem List   Diagnosis Date Noted  . Acute on chronic systolic heart failure (HCC) 01/03/2020  . Acute on chronic systolic (congestive) heart failure (HCC) 01/02/2020  . Atypical chest pain   . Lumbar radiculopathy 08/12/2019  . Pain in left knee 03/14/2019  . Stiffness of left knee 11/25/2018  . Congestive heart failure (HCC) 05/12/2018  . Hypercholesterolemia 05/12/2018  . Hypertensive disorder 05/12/2018  . Tear of lateral meniscus of knee 05/12/2018  . Osteoarthritis of left knee 03/29/2018  . Osteoarthritis of right knee 03/29/2018  . Pacemaker 04/17/2017  . Sleep apnea 04/17/2017  . Rotator cuff tear 01/23/2012   Past Medical History:  Past Medical History:  Diagnosis Date  . Back pain   . Cardiac defibrillator in place   . Cluster headaches   . Diabetes mellitus without complication (HCC)   . Hypertension    Past Surgical History:  Past Surgical History:  Procedure Laterality Date  . HYDROCELE EXCISION / REPAIR    . ROTATOR CUFF REPAIR Right 04/2012   HPI:  64yo male admitted 01/01/20 with chest pain related to 4.8cm ascending aortic aneurysm. PMH: NICM with EF 25%, pAFib s/p ICD, DM2, HTN, HLD, LLE weakness from injury in 1981, migraine/cluster headaches, GERD. CXR = Low lung volumes with bibasilar opacities favored to represent atelectasis. Pt exhibited SOB, left weakness and visual field neglect, slow to respond.   Assessment / Plan / Recommendation Clinical Impression  Pt was awake, seated in recliner upon arrival of SLP. Pt reports having a 12th grade education, retired, right handed. Pt reports left side weakness, but CN exam appears intact aside from decreased sensation on the left face. The [employer] Mental Status (SLUMS)  examination was administered. Pt scored 17/30, raising concern for neurocognitive deficits. Points were lost on mental math, immediate and delayed recall, thought organization, digit reversal, and auditory attention/recall. Pt responses were noted to be very slow and quite deliberate. When asked, pt reported  he has always been this way. No family present to  discuss baseline level of function. Continued ST intervention is recommended, targeting cognitive treatment.    SLP Assessment  SLP Recommendation/Assessment: Patient needs continued Speech Language Pathology Services SLP Visit Diagnosis: Cognitive communication deficit (R41.841)    Follow Up Recommendations  CIR   Frequency and Duration min 1 x/week  2 weeks      SLP Evaluation Cognition  Overall Cognitive Status: Impaired/Different from baseline Arousal/Alertness: Awake/alert Orientation Level: Oriented X4       Comprehension  Auditory Comprehension Overall Auditory Comprehension: Appears within functional limits for tasks assessed    Expression Expression Primary Mode of Expression: Verbal Verbal Expression Overall Verbal Expression: Appears within functional limits for tasks assessed   Oral / Motor  Oral Motor/Sensory Function Overall Oral Motor/Sensory Function: strength - Within functional limits - slight decreased sensation on left face Motor Speech Overall Motor Speech: Appears within functional limits for tasks assessed   GO                   Benji Poynter B. Murvin Natal, [facility], CCC-SLP Speech Language Pathologist Office: [phone]  Leigh Aurora 01/04/2020, 1:22 PM

## 2020-01-04 NOTE — Progress Notes (Signed)
Heart Failure Stewardship Pharmacist Progress Note   PCP: System, Pcp Not In PCP-Cardiologist: No primary care provider on file.    HPI:  64 yo M with PMH of NICM, paroxysmal afib on Eliquis, HTN, HLD, and diabetes. He presented to [facility] ED on 8/29 with chest pain. He reported exertional shortness of breath, orthopnea, and LE edema. His BNP was mildly elevated at 137.9. An ECHO was done on 01/03/20 showing LVEF of 40-45% (previously noted to be as low as 25% warranting BiV ICD placement in 2016). Patient complained of left sided weakness on 8/31. He was taken to CT but imaging showed no acute stroke. Plan for repeat CT today.  Current HF Medications: Furosemide 40 mg daily Carvedilol 3.125 mg BID Entresto 24/26 mg BID Imdur 60 mg daily  Prior to admission HF Medications: Furosemide 40 mg PRN Carvedilol 3.125 mg BID Losartan 25 mg daily Imdur 60 mg daily  Pertinent Lab Values: . Serum creatinine 1.32, BUN 14, Potassium 3.9, Sodium 136, BNP 137.9  Vital Signs: . Weight: 247 lbs (admission weight: not collected) . Blood pressure: 110-120/80s  . Heart rate: 70-80s   Medication Assistance / Insurance Benefits Check: Does the patient have prescription insurance? Yes Type of insurance plan: VA  Does the patient qualify for medication assistance through manufacturers or grants?   Yes . Eligible grants and/or patient assistance programs: pending . Medication assistance applications in progress: none  . Medication assistance applications approved: none Approved medication assistance renewals will be completed by: TBD  Outpatient Pharmacy:  [facility] to [facility] Is the patient willing to use [facility] TOC pharmacy at discharge? Pending Is the patient willing to transition their outpatient pharmacy to utilize a [facility] outpatient pharmacy?   Pending    Assessment: 1. Acute on chronic systolic CHF (EF 72-09%), due to NICM. NYHA class II symptoms. - Continue  furosemide 40 mg daily - Conitnue carvedilol 3.125 mg BID - Continue Entresto 24/26 mg BID - Continue Imdur 60 mg daily - Allergy noted to spironolactone (no comment on reaction). Will clarify with patient to see if we can re-challenge as outpatient - Consider starting Farxiga as outpatient   Plan: 1) Medication changes recommended at this time: - None at this time  2) Patient assistance application(s): - None pending  3)  Education  - To be completed prior to discharge  Sharen Hones, PharmD, BCPS Heart Failure Stewardship Pharmacist Phone [phone]

## 2020-01-04 NOTE — Evaluation (Signed)
Physical Therapy Evaluation Patient Details Name: Kristopher Scott MRN: [id number] DOB: [birth year] Today's Date: 01/04/2020   History of Present Illness  64 y.o. male with PMH significant for NICM with EF 25%, pAfib s/p ICD on Eliquis, DM2, HTN, HLD, (per patient--LLE weakness from injury 1981, migraine/cluster headaches) who presented to ED for Chest pain 01/01/20. CT angio chest showed 4.8 cm ascending aortic aneurysm. Cardiology consult with troponin mildly elevating--stated "symptoms concerning to unstable angina ? with pleuritic component." He had acute onset L sided weakness with a LKW of 0930 on 01/03/20. NIHSS 4; CT head, CT angio head/neck, CT cerebral perfusion negative and unable to get MRI due to defibrillator. Repeat head CT TBA  Clinical Impression   Pt admitted with above diagnosis. Patient with complicated medical and functional history. He has had LLE weakness due to prior injury since early 1980s (per pt). He used a rollator and a hinged knee brace PTA. He describes multiple falls due to left knee buckling but with increased frequency (5 falls in past month). Patient with strong bil UEs and prevented fall during session when his RIGHT knee buckled (able to support himself on RW and regain standing with min assist). Patient reports he feels his walking is only 20% compared to his walking 2 weeks ago (calling that 100%--his baseline).  Pt currently with functional limitations due to the deficits listed below (see PT Problem List). Pt will benefit from skilled PT to increase their independence and safety with mobility to allow discharge to the venue listed below.       Follow Up Recommendations CIR  (explained to patient that VA benefits may complicate his discharge plan and will continue to assess his needs)    Equipment Recommendations  Other (comment) (TBD next venue; ?wheelchair due to falls)    Recommendations for Other Services Rehab consult     Precautions / Restrictions  Precautions Precautions: Fall Precaution Comments: h/o falls due to left knee buckling (since early 1980s); fell 5x last month (more than usual per pt)      Mobility  Bed Mobility Overal bed mobility: Needs Assistance Bed Mobility: Supine to Sit     Supine to sit: Min guard     General bed mobility comments: incr time/effort to exit left side of bed (as he does at home); pt using RLE to assist LLE over EOB, pulling on side of mattress; near need for assist  Transfers Overall transfer level: Needs assistance Equipment used: Rolling walker (2 wheeled) Transfers: Sit to/from Stand Sit to Stand: Min guard;From elevated surface         General transfer comment: bed elevated to simulate bed height at home; standard height toilet with heavy use of grab bar on right  Ambulation/Gait Ambulation/Gait assistance: Min assist Gait Distance (Feet): 12 Feet (25 (seated use of toilet between)) Assistive device: Rolling walker (2 wheeled) Gait Pattern/deviations: Step-to pattern;Decreased step length - left;Decreased dorsiflexion - left;Decreased step length - right Gait velocity: decr (significantly per pt)   General Gait Details: left and right knee partial buckle due to rt knee pain (audible "pop" while walking) with pt supporting himself with bil UEs via RW; vc for pt to stay closer to RW (he is used to using rollator  Stairs            Wheelchair Mobility    Modified Rankin (Stroke Patients Only) Modified Rankin (Stroke Patients Only) Pre-Morbid Rankin Score: Moderately severe disability Modified Rankin: Moderately severe disability     Balance Overall  balance assessment: Needs assistance Sitting-balance support: No upper extremity supported;Feet supported Sitting balance-Leahy Scale: Fair Sitting balance - Comments: >fair NT   Standing balance support: Single extremity supported Standing balance-Leahy Scale: Poor Standing balance comment: single UE support while  pulling down/up his pants for toileting                             Pertinent Vitals/Pain Pain Assessment: 0-10 Pain Score: 9  Pain Location: left side of head Pain Descriptors / Indicators: Radiating;Tender Pain Intervention(s): Patient requesting pain meds-RN notified;RN gave pain meds during session    Home Living Family/patient expects to be discharged to:: Private residence Living Arrangements: Spouse/significant other Available Help at Discharge: Family;Available 24 hours/day (wife does not work) Type of Home: House Home Access: Ramped entrance;Stairs to enter (steep ramp back entrance--he usually uses this) Entrance Stairs-Rails: Right;Left Entrance Stairs-Number of Steps: 7 Home Layout: One level Home Equipment: Walker - 4 wheels;Crutches;Other (comment);Grab bars - tub/shower;Hand held shower head;Shower seat (EZ stander (bar on each side to pull up to stand); lift seat) Additional Comments: sometimes wears hinged knee brace and back brace    Prior Function Level of Independence: Needs assistance   Gait / Transfers Assistance Needed: wife assists with transfers if NOT on lift cushion/seat with EZ stander; sometimes helps with his walking; sometimes left knee "locks up" but more often it buckles and causes falls; uses rollator  ADL's / Homemaking Assistance Needed: wife assists with shower and sometimes dressing        Hand Dominance   Dominant Hand: Right    Extremity/Trunk Assessment   Upper Extremity Assessment Upper Extremity Assessment: Defer to OT evaluation    Lower Extremity Assessment Lower Extremity Assessment: LLE deficits/detail;RLE deficits/detail RLE Deficits / Details: painful Rt knee; knee extension 4/5 (limited by pain), ankle DF 5/5 RLE Sensation: WNL RLE Coordination: WNL LLE Deficits / Details: hip flexion 2+, knee flexion 3-, knee extension 2+, ankle DF 4/5, toe extension 5/5 LLE Sensation: decreased light touch (intact deep  pressure, pain, proprioception) LLE Coordination:  (unable to assess due to gross weakness)    Cervical / Trunk Assessment Cervical / Trunk Assessment: Other exceptions Cervical / Trunk Exceptions: overweight  Communication   Communication: No difficulties  Cognition Arousal/Alertness: Awake/alert Behavior During Therapy: WFL for tasks assessed/performed Overall Cognitive Status: Impaired/Different from baseline Area of Impairment: Orientation;Problem solving                 Orientation Level: Time (incr time to process it is Sept, not day of week )           Problem Solving: Slow processing General Comments: incr time processing and answering questions      General Comments General comments (skin integrity, edema, etc.): Patient received 4 phone calls from [place] system during assessment     Exercises     Assessment/Plan    PT Assessment Patient needs continued PT services  PT Problem List Decreased strength;Decreased balance;Decreased mobility;Decreased knowledge of use of DME;Impaired sensation;Obesity;Pain       PT Treatment Interventions DME instruction;Gait training;Functional mobility training;Therapeutic activities;Therapeutic exercise;Balance training;Neuromuscular re-education;Cognitive remediation;Patient/family education    PT Goals (Current goals can be found in the Care Plan section)  Acute Rehab PT Goals Patient Stated Goal: get stronger PT Goal Formulation: With patient Time For Goal Achievement: 01/18/20 Potential to Achieve Goals: Good    Frequency Min 4X/week   Barriers to discharge  Co-evaluation               AM-PAC PT "6 Clicks" Mobility  Outcome Measure Help needed turning from your back to your side while in a flat bed without using bedrails?: None Help needed moving from lying on your back to sitting on the side of a flat bed without using bedrails?: A Little Help needed moving to and from a bed to a chair (including a  wheelchair)?: A Little Help needed standing up from a chair using your arms (e.g., wheelchair or bedside chair)?: A Little Help needed to walk in hospital room?: A Little Help needed climbing 3-5 steps with a railing? : Total 6 Click Score: 17    End of Session Equipment Utilized During Treatment: Gait belt Activity Tolerance: Patient tolerated treatment well Patient left: in chair;with call bell/phone within reach;with chair alarm set Nurse Communication: Mobility status PT Visit Diagnosis: Repeated falls (R29.6);Other abnormalities of gait and mobility (R26.89);Muscle weakness (generalized) (M62.81)    Time: 1610-9604 PT Time Calculation (min) (ACUTE ONLY): 68 min   Charges:   PT Evaluation $PT Eval High Complexity: 1 High PT Treatments $Gait Training: 8-22 mins $Therapeutic Activity: 8-22 mins         Jerolyn Center, PT Pager [phone]   Zena Amos 01/04/2020, 10:29 AM

## 2020-01-04 NOTE — Evaluation (Signed)
Occupational Therapy Evaluation Patient Details Name: Kristopher Scott MRN: [id number] DOB: 1955/09/07 Today's Date: 01/04/2020    History of Present Illness 64 y.o. male with PMH significant for NICM with EF 25%, pAfib s/p ICD on Eliquis, DM2, HTN, HLD, (per patient--LLE weakness from injury 1981, migraine/cluster headaches) who presented to ED for Chest pain 01/01/20. CT angio chest showed 4.8 cm ascending aortic aneurysm. Cardiology consult with troponin mildly elevating--stated "symptoms concerning to unstable angina ? with pleuritic component." He had acute onset L sided weakness with a LKW of 0930 on 01/03/20. NIHSS 4; CT head, CT angio head/neck, CT cerebral perfusion negative and unable to get MRI due to defibrillator. Repeat head CT negative.    Clinical Impression   PTA patient reports using rollator for mobility with intermittent assist from spouse and ADLs with some assist for shower transfers and LB ADLs; limited light IADLs.  Patient reports increased falls recently.  Admitted for above and limited by problem list below including impaired balance, L thigh pain, decreased functional use of L UE (but reports 18 mo hx of difficulty with shoulder ROM), inconsistencies with L peripheral vision and possibly L inattention. Patient demonstrates ability to complete ADLs with min guard to max assist, transfers with min guard assist and limited mobility in room with min guard assist using RW.  He is able to use L UE functionally, but when asked to raise overhead pt unable to lift UE- unclear baseline.  Believe patient will benefit from further OT services while admitted and after dc at CIR level to optimize independence and return to baseline with ADLs, mobility for decreased burden of care.  Will follow.     Follow Up Recommendations  CIR;Supervision/Assistance - 24 hour    Equipment Recommendations  Tub/shower bench    Recommendations for Other Services Rehab consult     Precautions /  Restrictions Precautions Precautions: Fall Precaution Comments: h/o falls due to left knee buckling (since early 1980s); fell 5x last month (more than usual per pt) Restrictions Weight Bearing Restrictions: No      Mobility Bed Mobility               General bed mobility comments: OOB in recliner upon entry   Transfers Overall transfer level: Needs assistance Equipment used: Rolling walker (2 wheeled) Transfers: Sit to/from Stand Sit to Stand: Min guard         General transfer comment: from recliner, heavy use of UEs to power up and min guard to stedy until able to pull L LE underneath him    Balance Overall balance assessment: Needs assistance Sitting-balance support: No upper extremity supported;Feet supported Sitting balance-Leahy Scale: Fair     Standing balance support: Bilateral upper extremity supported;During functional activity Standing balance-Leahy Scale: Poor Standing balance comment: relies on B UE support, limited assessment due to L thigh pain today                           ADL either performed or assessed with clinical judgement   ADL Overall ADL's : Needs assistance/impaired     Grooming: Sitting;Set up;Supervision/safety   Upper Body Bathing: Minimal assistance;Sitting   Lower Body Bathing: Moderate assistance;Sit to/from stand   Upper Body Dressing : Minimal assistance;Sitting   Lower Body Dressing: Maximal assistance;Sit to/from stand Lower Body Dressing Details (indicate cue type and reason): unable to don/doff socks today, min guard sit to stand  Toilet Transfer: Minimal assistance;RW Toilet Transfer Details (indicate  cue type and reason): simulated in room         Functional mobility during ADLs: Minimal assistance;Rolling walker General ADL Comments: pt limited by L thigh pain, decreased functional use of L shoulder, impaired balance      Vision Baseline Vision/History: Wears glasses Wears Glasses: Reading  only Patient Visual Report: Peripheral vision impairment (L sided) Vision Assessment?: Yes Eye Alignment: Within Functional Limits Ocular Range of Motion: Within Functional Limits Alignment/Gaze Preference: Within Defined Limits Tracking/Visual Pursuits: Other (comment) (increased time to track towards L side, but functional ) Visual Fields: Other (comment) (further assessment, inconsistent on L visual field ) Additional Comments: Double Simultaneous Stimulation testing with inconsistencies to see L side when moving both objects (50% of the time)      Perception Perception Comments: L inattention, visually?   Praxis      Pertinent Vitals/Pain Pain Assessment: Faces Pain Score: 9  Faces Pain Scale: Hurts even more Pain Location: L thigh  Pain Descriptors / Indicators: Burning;Radiating Pain Intervention(s): Limited activity within patient's tolerance;Monitored during session;Repositioned     Hand Dominance Right   Extremity/Trunk Assessment Upper Extremity Assessment Upper Extremity Assessment: LUE deficits/detail LUE Deficits / Details: grossly 3-/5 shoulder, 3+/5 elbow and distal; pt reports 18 mo hx of difficulty using L shoulder (but deficits fluctuating)--pt able to use functionally to reach for items but unable to lift overhead when asked to assess ROM  LUE Sensation: decreased light touch LUE Coordination: decreased fine motor;decreased gross motor   Lower Extremity Assessment Lower Extremity Assessment: Defer to PT evaluation   Cervical / Trunk Assessment Cervical / Trunk Assessment: Other exceptions Cervical / Trunk Exceptions: overweight   Communication Communication Communication: No difficulties   Cognition Arousal/Alertness: Awake/alert Behavior During Therapy: WFL for tasks assessed/performed Overall Cognitive Status: Impaired/Different from baseline Area of Impairment: Awareness;Problem solving                           Awareness:  Emergent Problem Solving: Slow processing;Requires verbal cues General Comments: increased time for processing, problem solving   General Comments       Exercises     Shoulder Instructions      Home Living Family/patient expects to be discharged to:: Private residence Living Arrangements: Spouse/significant other Available Help at Discharge: Family;Available 24 hours/day Type of Home: House Home Access: Ramped entrance;Stairs to enter (steep ramp back entrance--he usually uses this ) Entrance Stairs-Number of Steps: 7 Entrance Stairs-Rails: Right;Left Home Layout: One level     Bathroom Shower/Tub: Chief Strategy Officer: Handicapped height Bathroom Accessibility: Yes   Home Equipment: Walker - 4 wheels;Crutches;Other (comment);Grab bars - tub/shower;Hand held shower head;Shower seat;Adaptive equipment (EZ stander (bar on each side to pull up to stand); lift seat) Adaptive Equipment: Sock aid Additional Comments: sometimes wears hinged knee brace and back brace      Prior Functioning/Environment Level of Independence: Needs assistance  Gait / Transfers Assistance Needed: wife assists with transfers if NOT on lift cushion/seat with EZ stander; sometimes helps with his walking; sometimes left knee "locks up" but more often it buckles and causes falls; uses rollator ADL's / Homemaking Assistance Needed: wife assists with shower transfer, and sometimes dressing (mostly LB); limited IADLs             OT Problem List: Decreased strength;Decreased range of motion;Impaired balance (sitting and/or standing);Decreased activity tolerance;Impaired vision/perception;Decreased coordination;Decreased safety awareness;Decreased knowledge of use of DME or AE;Decreased knowledge of precautions;Pain;Impaired UE functional  use;Impaired sensation;Obesity;Decreased cognition      OT Treatment/Interventions: Self-care/ADL training;DME and/or AE instruction;Neuromuscular  education;Therapeutic activities;Patient/family education;Balance training;Cognitive remediation/compensation;Visual/perceptual remediation/compensation    OT Goals(Current goals can be found in the care plan section) Acute Rehab OT Goals Patient Stated Goal: get stronger OT Goal Formulation: With patient Time For Goal Achievement: 01/18/20 Potential to Achieve Goals: Good  OT Frequency: Min 2X/week   Barriers to D/C:            Co-evaluation              AM-PAC OT "6 Clicks" Daily Activity     Outcome Measure Help from another person eating meals?: A Little Help from another person taking care of personal grooming?: A Little Help from another person toileting, which includes using toliet, bedpan, or urinal?: A Lot Help from another person bathing (including washing, rinsing, drying)?: A Lot Help from another person to put on and taking off regular upper body clothing?: A Little Help from another person to put on and taking off regular lower body clothing?: A Lot 6 Click Score: 15   End of Session Equipment Utilized During Treatment: Rolling walker;Gait belt Nurse Communication: Mobility status  Activity Tolerance: Patient tolerated treatment well Patient left: in chair;with call bell/phone within reach;with chair alarm set  OT Visit Diagnosis: Other abnormalities of gait and mobility (R26.89);Muscle weakness (generalized) (M62.81);Pain;Other symptoms and signs involving the nervous system (R29.898) Pain - Right/Left: Left Pain - part of body: Leg                Time: 1340-1405 OT Time Calculation (min): 25 min Charges:  OT General Charges $OT Visit: 1 Visit OT Evaluation $OT Eval Moderate Complexity: 1 Mod OT Treatments $Self Care/Home Management : 8-22 mins  Barry Brunner, OT Acute Rehabilitation Services Pager [phone] Office [phone]   Chancy Milroy 01/04/2020, 2:51 PM

## 2020-01-04 NOTE — Progress Notes (Signed)
Inpatient Rehab Admissions Coordinator Note:   Per PT recommendations, pt was screened for CIR candidacy by Estill Dooms, PT, DPT.  We will hold off on requesting a consult, and follow 1 more therapy session to assess for progress.  Pt may not require CIR stay if he progresses with therapy to prior level of function (assist with ADLs and transfers, occasional assist with gait).  Please contact me with questions.   Estill Dooms, PT, DPT [phone] 01/04/20 12:22 PM

## 2020-01-04 NOTE — Progress Notes (Signed)
STROKE TEAM PROGRESS NOTE   INTERVAL HISTORY I personally reviewed history of presenting illness, electronic medical records and imaging films in PACS.  Presented with sudden onset of chest pain left-sided weakness.  CT head is negative for acute stroke.  CT angiogram does not show any large vessel stenosis occlusion and CT perfusion shows no deficits patient has an AICD and hence MRI cannot be done.  2D echo shows ejection fraction of 40 to 45%.  Patient is on long-term Eliquis for his A. fib and states he was compliant with taking it  Vitals:   01/03/20 1208 01/03/20 1753 01/03/20 2010 01/04/20 0746  BP: 136/80 100/77 107/75 107/71  Pulse: (!) 58 87 73 92  Resp: 16 14 16 18   Temp: 98 F (36.7 C) 98.4 F (36.9 C) 98.3 F (36.8 C) 98 F (36.7 C)  TempSrc: Oral Oral Oral Oral  SpO2: 100% 99% 100% 99%  Weight:   112.4 kg   Height:   6\' 1"  (1.854 m)    CBC:  Recent Labs  Lab 01/01/20 1918 01/03/20 0624  WBC 4.7 3.5*  HGB 14.1 14.1  HCT 42.0 43.2  MCV 85.5 86.6  PLT 187 182   Basic Metabolic Panel:  Recent Labs  Lab 01/03/20 0624 01/04/20 0727  NA 138 136  K 3.3* 3.9  CL 101 102  CO2 29 22  GLUCOSE 129* 159*  BUN 12 14  CREATININE 1.24 1.32*  CALCIUM 8.8* 9.1   Lipid Panel:  Recent Labs  Lab 01/04/20 0727  CHOL 164  TRIG 108  HDL 43  CHOLHDL 3.8  VLDL 22  LDLCALC 99   HgbA1c:  Recent Labs  Lab 01/04/20 0727  HGBA1C 7.5*   Urine Drug Screen: No results for input(s): LABOPIA, COCAINSCRNUR, LABBENZ, AMPHETMU, THCU, LABBARB in the last 168 hours.  Alcohol Level No results for input(s): ETH in the last 168 hours.  IMAGING past 24 hours ECHOCARDIOGRAM COMPLETE  Result Date: 01/03/2020    ECHOCARDIOGRAM REPORT   Patient Name:   Kristopher Scott Date of Exam: 01/03/2020 Medical Rec #:  [id number]         Height:       73.0 in Accession #:    [id number]        Weight:       248.8 lb Date of Birth:  Nov 30, 1955         BSA:          2.361 m Patient Age:    64 years           BP:           130/85 mmHg Patient Gender: M                 HR:           58 bpm. Exam Location:  Inpatient Procedure: 2D Echo, Cardiac Doppler and Color Doppler Indications:    Cardiomyopathy.  History:        Patient has no prior history of Echocardiogram examinations.                 CHF, Pacemaker, Signs/Symptoms:Chest Pain; Risk                 Factors:Hypertension, Dyslipidemia and Sleep Apnea.  Sonographer:    Sheralyn Boatman RDCS Referring Phys: [id number] [facility]  Sonographer Comments: Technically difficult study due to poor echo windows. IMPRESSIONS  1. Left ventricular ejection fraction, by estimation, is 40 to 45%. The  left ventricle has mildly decreased function. The left ventricle demonstrates global hypokinesis with mild septal-lateral dyssynchrony. There is moderate left ventricular hypertrophy. Left ventricular diastolic parameters are consistent with Grade I diastolic dysfunction (impaired relaxation).  2. Right ventricular systolic function is mildly reduced. The right ventricular size is normal. There is normal pulmonary artery systolic pressure. The estimated right ventricular systolic pressure is 20.8 mmHg.  3. The mitral valve is normal in structure. Trivial mitral valve regurgitation. No evidence of mitral stenosis.  4. The aortic valve is tricuspid. Aortic valve regurgitation is mild to moderate. No aortic stenosis is present.  5. Aortic dilatation noted. There is moderate dilatation of the ascending aorta measuring 48 mm. Would consider dedicated imaging by CTA or MRA to further assess.  6. The inferior vena cava is normal in size with greater than 50% respiratory variability, suggesting right atrial pressure of 3 mmHg. FINDINGS  Left Ventricle: Left ventricular ejection fraction, by estimation, is 40 to 45%. The left ventricle has mildly decreased function. The left ventricle demonstrates global hypokinesis. The left ventricular internal cavity size was normal in size. There is   moderate left ventricular hypertrophy. Left ventricular diastolic parameters are consistent with Grade I diastolic dysfunction (impaired relaxation). Right Ventricle: The right ventricular size is normal. No increase in right ventricular wall thickness. Right ventricular systolic function is mildly reduced. There is normal pulmonary artery systolic pressure. The tricuspid regurgitant velocity is 2.11 m/s, and with an assumed right atrial pressure of 3 mmHg, the estimated right ventricular systolic pressure is 20.8 mmHg. Left Atrium: Left atrial size was normal in size. Right Atrium: Right atrial size was normal in size. Pericardium: There is no evidence of pericardial effusion. Mitral Valve: The mitral valve is normal in structure. Trivial mitral valve regurgitation. No evidence of mitral valve stenosis. Tricuspid Valve: The tricuspid valve is normal in structure. Tricuspid valve regurgitation is trivial. Aortic Valve: The aortic valve is tricuspid. Aortic valve regurgitation is mild to moderate. Aortic regurgitation PHT measures 678 msec. No aortic stenosis is present. Pulmonic Valve: The pulmonic valve was normal in structure. Pulmonic valve regurgitation is not visualized. Aorta: Aortic dilatation noted. There is moderate dilatation of the ascending aorta measuring 48 mm. Venous: The inferior vena cava is normal in size with greater than 50% respiratory variability, suggesting right atrial pressure of 3 mmHg. IAS/Shunts: No atrial level shunt detected by color flow Doppler. Additional Comments: A pacer wire is visualized in the right ventricle.  LEFT VENTRICLE PLAX 2D LVIDd:         5.00 cm      Diastology LVIDs:         3.85 cm      LV e' lateral:   9.00 cm/s LV PW:         1.40 cm      LV E/e' lateral: 6.3 LV IVS:        1.50 cm      LV e' medial:    5.84 cm/s LVOT diam:     2.40 cm      LV E/e' medial:  9.7 LVOT Area:     4.52 cm  LV Volumes (MOD) LV vol d, MOD A2C: 145.0 ml LV vol d, MOD A4C: 164.5 ml LV vol  s, MOD A2C: 67.1 ml LV vol s, MOD A4C: 79.8 ml LV SV MOD A2C:     77.9 ml LV SV MOD A4C:     164.5 ml LV SV MOD BP:  82.4 ml RIGHT VENTRICLE            IVC RV S prime:     5.68 cm/s  IVC diam: 1.70 cm TAPSE (M-mode): 1.6 cm LEFT ATRIUM             Index       RIGHT ATRIUM           Index LA diam:        3.70 cm 1.57 cm/m  RA Area:     16.90 cm LA Vol (A2C):   51.9 ml 21.98 ml/m RA Volume:   43.20 ml  18.30 ml/m LA Vol (A4C):   29.5 ml 12.50 ml/m LA Biplane Vol: 41.4 ml 17.54 ml/m  AORTIC VALVE AI PHT:      678 msec  AORTA Ao Root diam: 4.70 cm Ao Asc diam:  4.75 cm MITRAL VALVE               TRICUSPID VALVE MV Area (PHT): 1.66 cm    TR Peak grad:   17.8 mmHg MV Decel Time: 458 msec    TR Vmax:        211.00 cm/s MV E velocity: 56.60 cm/s MV A velocity: 81.00 cm/s  SHUNTS MV E/A ratio:  0.70        Systemic Diam: 2.40 cm Marca Ancona MD Electronically signed by Marca Ancona MD Signature Date/Time: 01/03/2020/12:27:41 PM    Final    US Abdomen Limited RUQ  Result Date: 01/03/2020 CLINICAL DATA:  Generalized abdominal pain over the last 10 years. EXAM: ULTRASOUND ABDOMEN LIMITED RIGHT UPPER QUADRANT COMPARISON:  CT 01/01/2020 FINDINGS: Gallbladder: No gallstones or wall thickening visualized. No sonographic Murphy sign noted by sonographer. Common bile duct: Diameter: 3.8 mm, normal Liver: No focal lesion identified. Within normal limits in parenchymal echogenicity. Portal vein is patent on color Doppler imaging with normal direction of blood flow towards the liver. Other: Very small right pleural effusion is noted incidentally. Multiple right renal cysts, largest measuring almost 8 cm. IMPRESSION: 1. No abnormality seen of the gallbladder or liver. 2. Very small right pleural effusion. 3. Multiple right renal cysts, largest measuring almost 8 cm. Electronically Signed   By: Paulina Fusi M.D.   On: 01/03/2020 16:33    PHYSICAL EXAM Pleasant middle-aged obese African-American male not in distress. .  Afebrile. Head is nontraumatic. Neck is supple without bruit.    Cardiac exam no murmur or gallop. Lungs are clear to auscultation. Distal pulses are well felt. Neurological Exam :  He is awake alert oriented to time place and person.  Speech is clear without dysarthria or aphasia.  Extraocular movements full range without nystagmus.  Blinks to threat bilaterally.  Face seems fairly symmetric except subtle left facial asymmetry when he smiles.  Tongue midline.  Motor system exam is limited due to pain in his left chest and hip there is subjective giveaway of strength testing on the left with drift but this may be related to pain.  Fine finger movements are diminished on the left compared to the right orbit site of left upper extremity.  Mild weakness of left hip flexors and hand muscles.  Sensation appears preserved bilaterally plantars downgoing.  Gait not tested. ASSESSMENT/PLAN Kristopher Scott is a 64 y.o. male with history of NICM, pAfibb s/p ICD on Eliquis, DM2, HTN, HLD admitted with CP who in hospital developed L sided weakness.   Stroke:   R brain infarct embolic secondary to known AF on  Eliquis  Code Stroke CT head No acute abnormality. ASPECTS 10.     CTA head & neck no LVO. No significant atherosclerosis. Ascending aortic aneurysm.   CT perfusion no core, no pneumbra  Repeat CT head no acute abnormality. Sinus dz.    2D Echo EF 40-45%. No source of embolus. Aortic dilitation   LDL 99   HgbA1c 7.5  VTE prophylaxis - eliquis  Eliquis (apixaban) daily prior to admission, now on Eliquis (apixaban) daily. Consider changing to pradaxa as an OP if the [facility] will cover    Therapy recommendations:  CIR  Disposition:  pending   Paroxysmal Atrial Fibrillation  Home anticoagulation:  Eliquis (apixaban) daily continued in the hospital . Continue Eliquis (apixaban) daily at discharge . Consider changing to pradaxa as an OP if the [facility] will cover and co-pay does not go up  significantly   Hypertension  Stable . Permissive hypertension (OK if < 220/120) but gradually normalize in 5-7 days . Long-term BP goal normotensive  Hyperlipidemia  Home meds:  lipitor 40, resumed in hospital  LDL 99, goal < 70  Increase lipitor to 80 daly  Continue statin at discharge  Diabetes type II Uncontrolled  HgbA1c 7.5, goal < 7.0  Other Stroke Risk Factors  ETOH use, advised to drink no more than 2 drink(s) a day  Obesity, Body mass index is 32.69 kg/m., recommend weight loss, diet and exercise as appropriate   NICM/chronic combined CHF s/p ICD  Other Active Problems  Atypical chest pain, not c/w ACS  AAA, stable  Hospital day # 1  He presented with sudden onset of chest pain and subjective left-sided weakness and neurological exam is limited due to giveaway weakness from pain and CT head is negative for stroke.  Patient has A. fib and is on long-term anticoagulation with Eliquis I would recommend to continue that at the present time as I do not see definite benefit of changing to alternative agent.  Repeat CT scan of the head without contrast tomorrow morning.  Continue ongoing stroke work-up and aggressive risk factor modification.  Mobilize out of bed.  Therapy consults.  Greater than 50% time during this 35-minute visit spent on counseling and coordination of care about suspected stroke and atrial fibrillation and stroke prevention and answering questions Delia Heady, MD To contact Stroke Continuity provider, please refer to [email or web address]. After hours, contact General Neurology

## 2020-01-04 NOTE — Progress Notes (Signed)
Progress Note  Patient Name: Kristopher Scott Date of Encounter: 01/04/2020  [facility] Cardiologist: New  Subjective   Imaging from yesterday showed no evidence of acute stroke. Patient still has some left sided weakness and decreased sensation. He has a headache this morning and not wanting to open his eyes. Says chest pain has moved down to the upper epigastric area and LUQ and it is still TTP.   Inpatient Medications    Scheduled Meds: . allopurinol  100 mg Oral Daily  . apixaban  5 mg Oral BID  . atorvastatin  40 mg Oral Daily  . carvedilol  3.125 mg Oral BID WC  . erythromycin with ethanol   Topical Daily  . furosemide  40 mg Oral Daily  . isosorbide mononitrate  60 mg Oral Daily  . prazosin  5 mg Oral QHS  . pregabalin  75 mg Oral Daily  . sacubitril-valsartan  1 tablet Oral BID   Continuous Infusions:  PRN Meds: acetaminophen, alum & mag hydroxide-simeth, HYDROcodone-acetaminophen, HYDROmorphone (DILAUDID) injection, nitroGLYCERIN, ondansetron (ZOFRAN) IV   Vital Signs    Vitals:   01/03/20 1208 01/03/20 1753 01/03/20 2010 01/04/20 0746  BP: 136/80 100/77 107/75 107/71  Pulse: (!) 58 87 73 92  Resp: 16 14 16 18   Temp: 98 F (36.7 C) 98.4 F (36.9 C) 98.3 F (36.8 C) 98 F (36.7 C)  TempSrc: Oral Oral Oral Oral  SpO2: 100% 99% 100% 99%  Weight:   112.4 kg   Height:   6\' 1"  (1.854 m)     Intake/Output Summary (Last 24 hours) at 01/04/2020 0831 Last data filed at 01/04/2020 0200 Gross per 24 hour  Intake 726 ml  Output 2000 ml  Net -1274 ml   Last 3 Weights 01/03/2020 01/03/2020 09/10/2017  Weight (lbs) 247 lb 12.8 oz 248 lb 12.8 oz 234 lb  Weight (kg) 112.401 kg 112.855 kg 106.142 kg      Telemetry    V-paced rhythm - Personally Reviewed  ECG    No new - Personally Reviewed  Physical Exam   GEN: No acute distress.   Neck: No JVD Cardiac: RRR, no murmurs, rubs, or gallops.  Respiratory: crackles at bases GI: LUQ pain MS: No edema; No  deformity. Neuro:  L sided weakness with decreased sensation Psych: Normal affect   Labs    High Sensitivity Troponin:   Recent Labs  Lab 01/01/20 1918 01/02/20 0229 01/02/20 0830 01/02/20 1124 01/03/20 1202  TROPONINIHS 6 11 18* 18* 13      Chemistry Recent Labs  Lab 01/01/20 1918 01/02/20 1124 01/03/20 0624 01/03/20 1202  NA 139  --  138  --   K 3.5  --  3.3*  --   CL 107  --  101  --   CO2 23  --  29  --   GLUCOSE 165*  --  129*  --   BUN 12  --  12  --   CREATININE 1.38*  --  1.24  --   CALCIUM 9.0  --  8.8*  --   PROT  --  6.3*  --  6.7  ALBUMIN  --  3.4*  --  3.5  AST  --  21  --  22  ALT  --  20  --  23  ALKPHOS  --  53  --  60  BILITOT  --  1.0  --  0.8  GFRNONAA 55*  --  >60  --  GFRAA >60  --  >60  --   ANIONGAP 9  --  8  --      Hematology Recent Labs  Lab 01/01/20 1918 01/03/20 0624  WBC 4.7 3.5*  RBC 4.91 4.99  HGB 14.1 14.1  HCT 42.0 43.2  MCV 85.5 86.6  MCH 28.7 28.3  MCHC 33.6 32.6  RDW 15.6* 15.8*  PLT 187 182    BNP Recent Labs  Lab 01/02/20 2025  BNP 137.9*     DDimer No results for input(s): DDIMER in the last 168 hours.   Radiology    CT CEREBRAL PERFUSION W CONTRAST  Result Date: 01/03/2020 CLINICAL DATA:  Neuro deficit, acute stroke suspected. EXAM: CT ANGIOGRAPHY HEAD AND NECK CT PERFUSION BRAIN TECHNIQUE: Multidetector CT imaging of the head and neck was performed using the standard protocol during bolus administration of intravenous contrast. Multiplanar CT image reconstructions and MIPs were obtained to evaluate the vascular anatomy. Carotid stenosis measurements (when applicable) are obtained utilizing NASCET criteria, using the distal internal carotid diameter as the denominator. Multiphase CT imaging of the brain was performed following IV bolus contrast injection. Subsequent parametric perfusion maps were calculated using RAPID software. CONTRAST:  OMNIPAQUE IOHEXOL 350 MG/ML SOLN COMPARISON:  Same day CT  head.  CT chest from 01/01/2020. FINDINGS: CTA NECK FINDINGS Aortic arch: Ascending aortic aneurysm is better evaluated on CTA chest from 01/01/2020. Right carotid system: No evidence of dissection, stenosis (50% or greater) or occlusion. Left carotid system: No evidence of dissection, stenosis (50% or greater) or occlusion. Vertebral arteries: Left dominant. No evidence of dissection, stenosis (50% or greater) or occlusion. Skeleton: No acute abnormality. Other neck: Negative Upper chest: Clear. Review of the MIP images confirms the above findings CTA HEAD FINDINGS Anterior circulation: No significant stenosis, proximal occlusion, aneurysm, or vascular malformation. Posterior circulation: No significant stenosis, proximal occlusion, aneurysm, or vascular malformation. Venous sinuses: As permitted by contrast timing, patent. The left transverse sinus is non dominant/diminutive with arachnoid granulation in the distal transverse sinus. Anatomic variants: The ACA is partially azygos proximally. Review of the MIP images confirms the above findings CT Brain Perfusion Findings: ASPECTS: 10 CBF (<30%) Volume: 74mL Perfusion (Tmax>6.0s) volume: 50mL Mismatch Volume: 42mL Infarction Location:None IMPRESSION: 1. No evidence of proximal large vessel occlusion, significant stenosis, or abnormal perfusion. 2. Ascending aortic aneurysm is better evaluated on CTA chest from 01/01/2020. Code stroke imaging results were communicated on 01/03/2020 at 10:50 am to provider Dr. Marchelle Folks via telephone, who verbally acknowledged these results. Electronically Signed   By: Feliberto Harts MD   On: 01/03/2020 11:08   ECHOCARDIOGRAM COMPLETE  Result Date: 01/03/2020    ECHOCARDIOGRAM REPORT   Patient Name:   Kristopher Scott Date of Exam: 01/03/2020 Medical Rec #:  [id number]         Height:       73.0 in Accession #:    [id number]        Weight:       248.8 lb Date of Birth:  05-15-1955         BSA:          2.361 m Patient Age:    63  years          BP:           130/85 mmHg Patient Gender: M                 HR:  58 bpm. Exam Location:  Inpatient Procedure: 2D Echo, Cardiac Doppler and Color Doppler Indications:    Cardiomyopathy.  History:        Patient has no prior history of Echocardiogram examinations.                 CHF, Pacemaker, Signs/Symptoms:Chest Pain; Risk                 Factors:Hypertension, Dyslipidemia and Sleep Apnea.  Sonographer:    Sheralyn Boatman RDCS Referring Phys: [id number] [facility]  Sonographer Comments: Technically difficult study due to poor echo windows. IMPRESSIONS  1. Left ventricular ejection fraction, by estimation, is 40 to 45%. The left ventricle has mildly decreased function. The left ventricle demonstrates global hypokinesis with mild septal-lateral dyssynchrony. There is moderate left ventricular hypertrophy. Left ventricular diastolic parameters are consistent with Grade I diastolic dysfunction (impaired relaxation).  2. Right ventricular systolic function is mildly reduced. The right ventricular size is normal. There is normal pulmonary artery systolic pressure. The estimated right ventricular systolic pressure is 20.8 mmHg.  3. The mitral valve is normal in structure. Trivial mitral valve regurgitation. No evidence of mitral stenosis.  4. The aortic valve is tricuspid. Aortic valve regurgitation is mild to moderate. No aortic stenosis is present.  5. Aortic dilatation noted. There is moderate dilatation of the ascending aorta measuring 48 mm. Would consider dedicated imaging by CTA or MRA to further assess.  6. The inferior vena cava is normal in size with greater than 50% respiratory variability, suggesting right atrial pressure of 3 mmHg. FINDINGS  Left Ventricle: Left ventricular ejection fraction, by estimation, is 40 to 45%. The left ventricle has mildly decreased function. The left ventricle demonstrates global hypokinesis. The left ventricular internal cavity size was normal in size.  There is  moderate left ventricular hypertrophy. Left ventricular diastolic parameters are consistent with Grade I diastolic dysfunction (impaired relaxation). Right Ventricle: The right ventricular size is normal. No increase in right ventricular wall thickness. Right ventricular systolic function is mildly reduced. There is normal pulmonary artery systolic pressure. The tricuspid regurgitant velocity is 2.11 m/s, and with an assumed right atrial pressure of 3 mmHg, the estimated right ventricular systolic pressure is 20.8 mmHg. Left Atrium: Left atrial size was normal in size. Right Atrium: Right atrial size was normal in size. Pericardium: There is no evidence of pericardial effusion. Mitral Valve: The mitral valve is normal in structure. Trivial mitral valve regurgitation. No evidence of mitral valve stenosis. Tricuspid Valve: The tricuspid valve is normal in structure. Tricuspid valve regurgitation is trivial. Aortic Valve: The aortic valve is tricuspid. Aortic valve regurgitation is mild to moderate. Aortic regurgitation PHT measures 678 msec. No aortic stenosis is present. Pulmonic Valve: The pulmonic valve was normal in structure. Pulmonic valve regurgitation is not visualized. Aorta: Aortic dilatation noted. There is moderate dilatation of the ascending aorta measuring 48 mm. Venous: The inferior vena cava is normal in size with greater than 50% respiratory variability, suggesting right atrial pressure of 3 mmHg. IAS/Shunts: No atrial level shunt detected by color flow Doppler. Additional Comments: A pacer wire is visualized in the right ventricle.  LEFT VENTRICLE PLAX 2D LVIDd:         5.00 cm      Diastology LVIDs:         3.85 cm      LV e' lateral:   9.00 cm/s LV PW:         1.40 cm  LV E/e' lateral: 6.3 LV IVS:        1.50 cm      LV e' medial:    5.84 cm/s LVOT diam:     2.40 cm      LV E/e' medial:  9.7 LVOT Area:     4.52 cm  LV Volumes (MOD) LV vol d, MOD A2C: 145.0 ml LV vol d, MOD A4C: 164.5  ml LV vol s, MOD A2C: 67.1 ml LV vol s, MOD A4C: 79.8 ml LV SV MOD A2C:     77.9 ml LV SV MOD A4C:     164.5 ml LV SV MOD BP:      82.4 ml RIGHT VENTRICLE            IVC RV S prime:     5.68 cm/s  IVC diam: 1.70 cm TAPSE (M-mode): 1.6 cm LEFT ATRIUM             Index       RIGHT ATRIUM           Index LA diam:        3.70 cm 1.57 cm/m  RA Area:     16.90 cm LA Vol (A2C):   51.9 ml 21.98 ml/m RA Volume:   43.20 ml  18.30 ml/m LA Vol (A4C):   29.5 ml 12.50 ml/m LA Biplane Vol: 41.4 ml 17.54 ml/m  AORTIC VALVE AI PHT:      678 msec  AORTA Ao Root diam: 4.70 cm Ao Asc diam:  4.75 cm MITRAL VALVE               TRICUSPID VALVE MV Area (PHT): 1.66 cm    TR Peak grad:   17.8 mmHg MV Decel Time: 458 msec    TR Vmax:        211.00 cm/s MV E velocity: 56.60 cm/s MV A velocity: 81.00 cm/s  SHUNTS MV E/A ratio:  0.70        Systemic Diam: 2.40 cm Marca Ancona MD Electronically signed by Marca Ancona MD Signature Date/Time: 01/03/2020/12:27:41 PM    Final    CT HEAD CODE STROKE WO CONTRAST  Result Date: 01/03/2020 CLINICAL DATA:  Code stroke.  Neuro deficit. EXAM: CT HEAD WITHOUT CONTRAST TECHNIQUE: Contiguous axial images were obtained from the base of the skull through the vertex without intravenous contrast. COMPARISON:  CT head 01/25/2013 FINDINGS: Brain: No evidence of acute infarction, hemorrhage, hydrocephalus, extra-axial collection or mass lesion/mass effect. Vascular: No hyperdense vessel or unexpected calcification. Calcific intracranial atherosclerosis. Skull: Normal. Negative for fracture or focal lesion. Sinuses/Orbits: No acute finding. Other: No mastoid effusions. ASPECTS ([place] Stroke Program Early CT Score):10 IMPRESSION: 1. WNo acute hemorrhage or evidence of large vascular territory infarct. 2. ASPECTS is 10 Code stroke imaging results were communicated on 01/03/2020 at 10:38 am to provider Dr. Derry Lory Via telephone, who verbally acknowledged these results. Electronically Signed   By: Feliberto Harts MD   On: 01/03/2020 10:42   CT ANGIO HEAD CODE STROKE  Result Date: 01/03/2020 CLINICAL DATA:  Neuro deficit, acute stroke suspected. EXAM: CT ANGIOGRAPHY HEAD AND NECK CT PERFUSION BRAIN TECHNIQUE: Multidetector CT imaging of the head and neck was performed using the standard protocol during bolus administration of intravenous contrast. Multiplanar CT image reconstructions and MIPs were obtained to evaluate the vascular anatomy. Carotid stenosis measurements (when applicable) are obtained utilizing NASCET criteria, using the distal internal carotid diameter as the denominator. Multiphase CT imaging of the brain was  performed following IV bolus contrast injection. Subsequent parametric perfusion maps were calculated using RAPID software. CONTRAST:  OMNIPAQUE IOHEXOL 350 MG/ML SOLN COMPARISON:  Same day CT head.  CT chest from 01/01/2020. FINDINGS: CTA NECK FINDINGS Aortic arch: Ascending aortic aneurysm is better evaluated on CTA chest from 01/01/2020. Right carotid system: No evidence of dissection, stenosis (50% or greater) or occlusion. Left carotid system: No evidence of dissection, stenosis (50% or greater) or occlusion. Vertebral arteries: Left dominant. No evidence of dissection, stenosis (50% or greater) or occlusion. Skeleton: No acute abnormality. Other neck: Negative Upper chest: Clear. Review of the MIP images confirms the above findings CTA HEAD FINDINGS Anterior circulation: No significant stenosis, proximal occlusion, aneurysm, or vascular malformation. Posterior circulation: No significant stenosis, proximal occlusion, aneurysm, or vascular malformation. Venous sinuses: As permitted by contrast timing, patent. The left transverse sinus is non dominant/diminutive with arachnoid granulation in the distal transverse sinus. Anatomic variants: The ACA is partially azygos proximally. Review of the MIP images confirms the above findings CT Brain Perfusion Findings: ASPECTS: 10 CBF (<30%)  Volume: 0mL Perfusion (Tmax>6.0s) volume: 0mL Mismatch Volume: 0mL Infarction Location:None IMPRESSION: 1. No evidence of proximal large vessel occlusion, significant stenosis, or abnormal perfusion. 2. Ascending aortic aneurysm is better evaluated on CTA chest from 01/01/2020. Code stroke imaging results were communicated on 01/03/2020 at 10:50 am to provider Dr. Marchelle Folks via telephone, who verbally acknowledged these results. Electronically Signed   By: Feliberto Harts MD   On: 01/03/2020 11:08   CT ANGIO NECK CODE STROKE  Result Date: 01/03/2020 CLINICAL DATA:  Neuro deficit, acute stroke suspected. EXAM: CT ANGIOGRAPHY HEAD AND NECK CT PERFUSION BRAIN TECHNIQUE: Multidetector CT imaging of the head and neck was performed using the standard protocol during bolus administration of intravenous contrast. Multiplanar CT image reconstructions and MIPs were obtained to evaluate the vascular anatomy. Carotid stenosis measurements (when applicable) are obtained utilizing NASCET criteria, using the distal internal carotid diameter as the denominator. Multiphase CT imaging of the brain was performed following IV bolus contrast injection. Subsequent parametric perfusion maps were calculated using RAPID software. CONTRAST:  OMNIPAQUE IOHEXOL 350 MG/ML SOLN COMPARISON:  Same day CT head.  CT chest from 01/01/2020. FINDINGS: CTA NECK FINDINGS Aortic arch: Ascending aortic aneurysm is better evaluated on CTA chest from 01/01/2020. Right carotid system: No evidence of dissection, stenosis (50% or greater) or occlusion. Left carotid system: No evidence of dissection, stenosis (50% or greater) or occlusion. Vertebral arteries: Left dominant. No evidence of dissection, stenosis (50% or greater) or occlusion. Skeleton: No acute abnormality. Other neck: Negative Upper chest: Clear. Review of the MIP images confirms the above findings CTA HEAD FINDINGS Anterior circulation: No significant stenosis, proximal occlusion,  aneurysm, or vascular malformation. Posterior circulation: No significant stenosis, proximal occlusion, aneurysm, or vascular malformation. Venous sinuses: As permitted by contrast timing, patent. The left transverse sinus is non dominant/diminutive with arachnoid granulation in the distal transverse sinus. Anatomic variants: The ACA is partially azygos proximally. Review of the MIP images confirms the above findings CT Brain Perfusion Findings: ASPECTS: 10 CBF (<30%) Volume: 0mL Perfusion (Tmax>6.0s) volume: 0mL Mismatch Volume: 0mL Infarction Location:None IMPRESSION: 1. No evidence of proximal large vessel occlusion, significant stenosis, or abnormal perfusion. 2. Ascending aortic aneurysm is better evaluated on CTA chest from 01/01/2020. Code stroke imaging results were communicated on 01/03/2020 at 10:50 am to provider Dr. Marchelle Folks via telephone, who verbally acknowledged these results. Electronically Signed   By: Feliberto Harts MD  On: 01/03/2020 11:08   US Abdomen Limited RUQ  Result Date: 01/03/2020 CLINICAL DATA:  Generalized abdominal pain over the last 10 years. EXAM: ULTRASOUND ABDOMEN LIMITED RIGHT UPPER QUADRANT COMPARISON:  CT 01/01/2020 FINDINGS: Gallbladder: No gallstones or wall thickening visualized. No sonographic Murphy sign noted by sonographer. Common bile duct: Diameter: 3.8 mm, normal Liver: No focal lesion identified. Within normal limits in parenchymal echogenicity. Portal vein is patent on color Doppler imaging with normal direction of blood flow towards the liver. Other: Very small right pleural effusion is noted incidentally. Multiple right renal cysts, largest measuring almost 8 cm. IMPRESSION: 1. No abnormality seen of the gallbladder or liver. 2. Very small right pleural effusion. 3. Multiple right renal cysts, largest measuring almost 8 cm. Electronically Signed   By: Paulina Fusi M.D.   On: 01/03/2020 16:33    Cardiac Studies   Echo 01/03/20 1. Left ventricular  ejection fraction, by estimation, is 40 to 45%. The  left ventricle has mildly decreased function. The left ventricle  demonstrates global hypokinesis with mild septal-lateral dyssynchrony.  There is moderate left ventricular  hypertrophy. Left ventricular diastolic parameters are consistent with  Grade I diastolic dysfunction (impaired relaxation).  2. Right ventricular systolic function is mildly reduced. The right  ventricular size is normal. There is normal pulmonary artery systolic  pressure. The estimated right ventricular systolic pressure is 20.8 mmHg.  3. The mitral valve is normal in structure. Trivial mitral valve  regurgitation. No evidence of mitral stenosis.  4. The aortic valve is tricuspid. Aortic valve regurgitation is mild to  moderate. No aortic stenosis is present.  5. Aortic dilatation noted. There is moderate dilatation of the ascending  aorta measuring 48 mm. Would consider dedicated imaging by CTA or MRA to  further assess.  6. The inferior vena cava is normal in size with greater than 50%  respiratory variability, suggesting right atrial pressure of 3 mmHg.   Patient Profile     64 y.o. male is a 76M with chronic systolic and diastolic heart failure (NICM), paroxysmal atrial fibrillation, s/p ICD, hypertension, hyperlipidemia and diabetes admitted with chest pain.   Assessment & Plan    L sided weakness - Left sided weakness yesterday morning. Code stroke called. Head CT with no acute stroke. NO MRI brain due to ICD. Pt is on Eliquis for afib. Neuro following. - Still has Lsided weakness and decreased sensation  - Echo showed EF 40-45% - Plan for repeat CT today.   NICM/chronic combined CHF s/p ICD - volume overloaded on presentation. BNP 138 - IV lasix transitioned to lasix 40 mg daily - Entresto, coreg - Net toal -2.8L - Echo showed LVEF 40-45%, global hypokinesis with mild septal-lat dyssynchrony, mod LVH, G1DD, trivial MR, mild to mod AS, aortic  dilation 48mm  Atypical chest pain - TTP on exam, now in the RUQ upper epigastric area - Hs trop minimally elevated with flat trend, not consistent with ACS - He had a normal cath in the [place] [facility], records were requested - Considering lexiscan myoview for ischemic eval, however this is not urgent esp given acute stroke-like symptoms  HTN  - coreg, losartan, and Imdur. Meds held for permissive hyertension  PAF - NSR on exam - Eliquis for a/c - coreg for rate control  HLD - continue statin - LDL 99  AAA - stable at 4.8cm - continue OP imaging  For questions or updates, please contact [facility] Please consult [email or web address] for contact info under  Signed, Naiya Corral David Stall, PA-C  01/04/2020, 8:31 AM

## 2020-01-05 ENCOUNTER — Encounter (HOSPITAL_COMMUNITY): Payer: Self-pay | Admitting: Cardiovascular Disease

## 2020-01-05 DIAGNOSIS — G459 Transient cerebral ischemic attack, unspecified: Secondary | ICD-10-CM

## 2020-01-05 LAB — BASIC METABOLIC PANEL
Anion gap: 9 (ref 5–15)
BUN: 15 mg/dL (ref 8–23)
CO2: 25 mmol/L (ref 22–32)
Calcium: 9 mg/dL (ref 8.9–10.3)
Chloride: 103 mmol/L (ref 98–111)
Creatinine, Ser: 1.36 mg/dL — ABNORMAL HIGH (ref 0.61–1.24)
GFR calc Af Amer: >60 mL/min (ref >60)
GFR calc non Af Amer: 56 mL/min — ABNORMAL LOW (ref >60)
Glucose, Bld: 150 mg/dL — ABNORMAL HIGH (ref 70–99)
Potassium: 3.5 mmol/L (ref 3.5–5.1)
Sodium: 137 mmol/L (ref 135–145)

## 2020-01-05 MED ORDER — EMPAGLIFLOZIN 10 MG PO TABS
10.0000 mg | ORAL_TABLET | Freq: Every day | ORAL | Status: DC
  Administered 2020-01-05 – 2020-01-07 (×3): 10 mg via ORAL
  Filled 2020-01-05 (×4): qty 1

## 2020-01-05 MED ORDER — FUROSEMIDE 20 MG PO TABS
20.0000 mg | ORAL_TABLET | Freq: Every day | ORAL | Status: DC
  Administered 2020-01-06 – 2020-01-07 (×2): 20 mg via ORAL
  Filled 2020-01-05 (×2): qty 1

## 2020-01-05 NOTE — TOC Initial Note (Signed)
Transition of Care [facility]) - Initial/Assessment Note    Patient Details  Name: Kristopher Scott MRN: [id number] Date of Birth: 01/31/1956  Transition of Care [facility]) CM/SW Contact:    Kermit Balo, RN Phone Number: 01/05/2020, 4:07 PM  Clinical Narrative:                 Pt is from home with spouse that he says is with him most of the time.  Wife does most of the driving and his medications at home.  [facility]. Pt will need 30 day supply of his entresto at d/c and then he will have [facility].  Recommendations are for CIR.  TOC following.  Expected Discharge Plan: IP Rehab Facility Barriers to Discharge: Continued Medical Work up   Patient Goals and CMS Choice     Choice offered to / list presented to : Patient  Expected Discharge Plan and Services Expected Discharge Plan: IP Rehab Facility   Discharge Planning Services: CM Consult Post Acute Care Choice: IP Rehab Living arrangements for the past 2 months: Single Family Home                                      Prior Living Arrangements/Services Living arrangements for the past 2 months: Single Family Home Lives with:: Spouse Patient language and need for interpreter reviewed:: Yes Do you feel safe going back to the place where you live?: Yes      Need for Family Participation in Patient Care: Yes (Comment) Care giver support system in place?: Yes (comment) Current home services: DME (cane/ shower seat/ walker) Criminal Activity/Legal Involvement Pertinent to Current Situation/Hospitalization: No - Comment as needed  Activities of Daily Living Home Assistive Devices/Equipment: Cane (specify quad or straight) ADL Screening (condition at time of admission) Patient's cognitive ability adequate to safely complete daily activities?: Yes Is the patient deaf or have difficulty hearing?: No Does the patient have difficulty seeing, even when wearing glasses/contacts?:  No Does the patient have difficulty concentrating, remembering, or making decisions?: No Patient able to express need for assistance with ADLs?: No Does the patient have difficulty dressing or bathing?: No Independently performs ADLs?: Yes (appropriate for developmental age) Does the patient have difficulty walking or climbing stairs?: Yes Weakness of Legs: Left Weakness of Arms/Hands: None  Permission Sought/Granted                  Emotional Assessment Appearance:: Appears stated age Attitude/Demeanor/Rapport: Engaged Affect (typically observed): Accepting Orientation: : Oriented to Self, Oriented to Place, Oriented to  Time, Oriented to Situation   Psych Involvement: No (comment)  Admission diagnosis:  Unstable angina (HCC) [I20.0] Pleurodynia [R07.81] Acute on chronic systolic (congestive) heart failure (HCC) [I50.23] Acute on chronic systolic heart failure (HCC) [I50.23] Patient Active Problem List   Diagnosis Date Noted  . Acute on chronic systolic heart failure (HCC) 01/03/2020  . Acute on chronic systolic (congestive) heart failure (HCC) 01/02/2020  . Atypical chest pain   . Lumbar radiculopathy 08/12/2019  . Pain in left knee 03/14/2019  . Stiffness of left knee 11/25/2018  . Congestive heart failure (HCC) 05/12/2018  . Hypercholesterolemia 05/12/2018  . Hypertensive disorder 05/12/2018  . Tear of lateral meniscus of knee 05/12/2018  . Osteoarthritis of left knee 03/29/2018  . Osteoarthritis of right knee 03/29/2018  . Pacemaker 04/17/2017  .  Sleep apnea 04/17/2017  . Rotator cuff tear 01/23/2012   PCP:  System, Pcp Not In Pharmacy:   [facility] [phone] [place], [place] - [phone] AT [facility] [address] [facility] [address] [place]onda [place] [address] Phone: [phone] Fax: [phone]     Social Determinants of Health (SDOH) Interventions    Readmission Risk Interventions No flowsheet data found.

## 2020-01-05 NOTE — Consult Note (Addendum)
Physical Medicine and Rehabilitation Consult   Reason for Consult: Functional deficits Referring Physician: Dr. Duke Salvia   HPI: Kristopher Scott is a 64 y.o. male with history of HTN, T2DM, chronic systolic/diastolic heart failure s/p AICD,  LBP/HNP/knee injury with LLE weakness,  PAF-on Eliquis; who was admitted on 01/01/2020 with chest discomfort and volume overload.  CTA chest negative for PE.  Patient with evidence of mild volume overload and was started on IV diuresis.  He continued to have atypical chest pain with decrease in movement felt to be pleuritic in nature by cardiology.   On 08/31 patient had acute onset difficulty speaking, had left visual neglect and left-sided weakness.  CT head negative and CTA negative for LVO, significant stenosis or abnormal perfusion.  He was not felt to be a TPA candidate as on Eliquis.  2D echo showed EF of 40 to 45% with global hypokinesis and moderate left ventricular hypertrophy as well as moderate dilation of ascending aorta ~ 48mm.    MRI not done due to AICD.  Patient continued to have left-sided pain and weakness with giveaway strength, hemisensory loss as well as decrease in fine motor movement on left per neurology exam.  Repeat CT head negative.  Dr. Pearlean Brownie recommended changing patient to Pradaxa pending [facility] approval.  Patient with history of baseline LLE weakness with multiple falls due to knee giveaway but reports increasing weakness at this time.  Therapy ongoing revealing functional deficits.  CIR recommended for follow-up therapy   Review of Systems  Constitutional: Negative for chills and fever.  HENT: Negative for hearing loss and tinnitus.   Respiratory: Negative for cough and shortness of breath.   Cardiovascular: Positive for chest pain and leg swelling.  Gastrointestinal: Negative for constipation, heartburn and nausea.  Musculoskeletal: Positive for back pain, joint pain (left knee) and myalgias (pain between shoulders).   Neurological: Positive for dizziness, sensory change, focal weakness and headaches (chronic headaches with light sensitivity--can last from an hour to 3 days.   ).     Past Medical History:  Diagnosis Date  . Back pain    ESI/TENS/therapy  . Cardiac defibrillator in place 12/2014  . Cluster headaches   . Diabetes mellitus without complication (HCC)   . Hypertension   . Pinched vertebral nerve    pain/burning from left thigh to left foot at times    Past Surgical History:  Procedure Laterality Date  . HYDROCELE EXCISION / REPAIR    . ROTATOR CUFF REPAIR Right 04/2012    Family History  Problem Relation Age of Onset  . Hypertension Mother   . Diabetes Mother     Social History:  Married. Retired/disabled from [employer]. Used walker and crutches for mobility. He reports that he has never smoked. He has never used smokeless tobacco. He reports current alcohol -- couple of ounces of wine and 14-16 ounces beer/week. He reports that he does not use drugs.   Allergies  Allergen Reactions  . Spironolactone Other (See Comments)  . Lisinopril Cough  . Propranolol Itching    Medications Prior to Admission  Medication Sig Dispense Refill  . allopurinol (ZYLOPRIM) 100 MG tablet Take 100 mg by mouth daily.     Marland Kitchen Apixaban (ELIQUIS PO) Take 5 mg by mouth 2 (two) times daily.     Marland Kitchen atorvastatin (LIPITOR) 80 MG tablet Take 40 mg by mouth daily.     . carvedilol (COREG) 6.25 MG tablet Take 3.125 mg by mouth 2 (two)  times daily with a meal.    . clonazePAM (KLONOPIN) 2 MG tablet Take 2 mg by mouth 2 (two) times daily as needed for anxiety (sleep).    Marland Kitchen erythromycin with ethanol (ERYGEL) 2 % gel Apply topically daily. To affected area in between toes of left foot 30 g 0  . furosemide (LASIX) 40 MG tablet Take 1 tablet (40 mg total) by mouth daily. (Patient taking differently: Take 40 mg by mouth daily as needed for fluid or edema (more than 3 lbs in a day or 5 lbs in a week.). ) 15 tablet 0  .  HYDROcodone-acetaminophen (NORCO/VICODIN) 5-325 MG tablet Take 1 tablet by mouth every 6 (six) hours as needed for moderate pain.     . isosorbide mononitrate (IMDUR) 60 MG 24 hr tablet Take 60 mg by mouth daily.     Marland Kitchen ketoconazole (NIZORAL) 2 % cream Apply 1 fingertip amount to bottom of each foot daily. 30 g 0  . losartan (COZAAR) 25 MG tablet Take 25 mg by mouth daily.     . naproxen (NAPROSYN) 375 MG tablet Take 1 tablet (375 mg total) by mouth 2 (two) times daily. (Patient taking differently: Take 375 mg by mouth 2 (two) times daily as needed for mild pain. ) 20 tablet 0  . nitroGLYCERIN (NITROSTAT) 0.4 MG SL tablet Place 0.4 mg under the tongue every 5 (five) minutes as needed for chest pain.     . prazosin (MINIPRESS) 5 MG capsule Take 5 mg by mouth at bedtime.    . pregabalin (LYRICA) 75 MG capsule Take 75 mg by mouth daily.      Home: Home Living Family/patient expects to be discharged to:: Private residence Living Arrangements: Spouse/significant other Available Help at Discharge: Family, Available 24 hours/day Type of Home: House Home Access: Ramped entrance, Stairs to enter (steep ramp back entrance--he usually uses this ) Entrance Stairs-Number of Steps: 7 Entrance Stairs-Rails: Right, Left Home Layout: One level Bathroom Shower/Tub: Engineer, manufacturing systems: Handicapped height Bathroom Accessibility: Yes Home Equipment: Environmental consultant - 4 wheels, Crutches, Other (comment), Grab bars - tub/shower, Hand held shower head, Shower seat, Adaptive equipment (EZ stander (bar on each side to pull up to stand); lift seat) Adaptive Equipment: Sock aid Additional Comments: sometimes wears hinged knee brace and back brace  Functional History: Prior Function Level of Independence: Needs assistance Gait / Transfers Assistance Needed: wife assists with transfers if NOT on lift cushion/seat with EZ stander; sometimes helps with his walking; sometimes left knee "locks up" but more often it  buckles and causes falls; uses rollator ADL's / Homemaking Assistance Needed: wife assists with shower transfer, and sometimes dressing (mostly LB); limited IADLs  Functional Status:  Mobility: Bed Mobility Overal bed mobility: Needs Assistance Bed Mobility: Supine to Sit Supine to sit: Min guard General bed mobility comments: OOB in recliner upon entry  Transfers Overall transfer level: Needs assistance Equipment used: Rolling walker (2 wheeled) Transfers: Sit to/from Stand Sit to Stand: Min assist General transfer comment: Min assist to stabilize RW and pt as he transitions hands to RW: from recliner, heavy use of UEs to power up and min guard to stedy until able to pull L LE underneath him Ambulation/Gait Ambulation/Gait assistance: Min assist Gait Distance (Feet): 30 Feet (seated rest; 4 ft) Assistive device: Rolling walker (2 wheeled) Gait Pattern/deviations: Step-to pattern, Decreased step length - left, Decreased dorsiflexion - left, Decreased step length - right General Gait Details: Pt required cues to recall reaching forward  with left heel to improve foot clearance (still drags left foot, but does show some dorsiflexion); again required cues to NOT step rt foot past left foot to reduce left knee buckling (with instruction, had no episodes of buckling during gait); pt supporting himself with bil UEs via RW; vc for pt to stay closer to RW (he is used to using rollator) Gait velocity: required 15 minutes to walk 34 ft (stopping rest, stopping for cuing from PT, very slow advancing LLE) Gait velocity interpretation: <1.31 ft/sec, indicative of household ambulator    ADL: ADL Overall ADL's : Needs assistance/impaired Grooming: Sitting, Set up, Supervision/safety Upper Body Bathing: Minimal assistance, Sitting Lower Body Bathing: Moderate assistance, Sit to/from stand Upper Body Dressing : Minimal assistance, Sitting Lower Body Dressing: Maximal assistance, Sit to/from  stand Lower Body Dressing Details (indicate cue type and reason): unable to don/doff socks today, min guard sit to stand  Toilet Transfer: Minimal assistance, RW Toilet Transfer Details (indicate cue type and reason): simulated in room Functional mobility during ADLs: Minimal assistance, Rolling walker General ADL Comments: pt limited by L thigh pain, decreased functional use of L shoulder, impaired balance   Cognition: Cognition Overall Cognitive Status: Impaired/Different from baseline Arousal/Alertness: Awake/alert Orientation Level: Oriented X4 Cognition Arousal/Alertness: Awake/alert Behavior During Therapy: WFL for tasks assessed/performed Overall Cognitive Status: Impaired/Different from baseline Area of Impairment: Awareness, Problem solving, Memory, Following commands Orientation Level:  (NT) Memory:  (?language vs memory; difficulty recalling what wants to say) Following Commands: Follows one step commands consistently, Follows one step commands with increased time, Follows multi-step commands inconsistently (following commands very slow, almost labored) Awareness: Emergent Problem Solving: Slow processing, Requires verbal cues General Comments: increased time for processing, problem solving   Blood pressure 94/66, pulse 89, temperature 98 F (36.7 C), temperature source Oral, resp. rate 18, height 6\' 1"  (1.854 m), weight 112.6 kg, SpO2 99 %. Physical Exam  General: Alert and oriented x 3, No apparent distress HEENT: Head is normocephalic, atraumatic, PERRLA, EOMI, sclera anicteric, oral mucosa pink and moist, dentition intact, ext ear canals clear,  Neck: Supple without JVD or lymphadenopathy Heart: Reg rate and rhythm. No murmurs rubs or gallops Chest: CTA bilaterally without wheezes, rales, or rhonchi; no distress Abdomen: Soft, non-tender, non-distended, bowel sounds positive. Extremities: No clubbing, cyanosis, or edema. Pulses are 2+ Skin: Clean and intact without  signs of breakdown Neuro: Pt is cognitively appropriate with normal insight, memory, and awareness. Cranial nerves 2-12 are intact except for left facial droop. Reflexes are 2+ in all 4's. Fine motor coordination is intact. No tremors. Motor function is grossly 5/5 with the exception of 0/5 left leg- does not put forth effort. Has "fuzzy sensation" throughout left LE to touch.  Musculoskeletal: Ambulated with slow cadence with RW CG to bathroom Psych: Pt's affect is appropriate. Pt is cooperative  Results for orders placed or performed during the hospital encounter of 01/01/20 (from the past 24 hour(s))  Basic metabolic panel     Status: Abnormal   Collection Time: 01/05/20  3:45 AM  Result Value Ref Range   Sodium 137 135 - 145 mmol/L   Potassium 3.5 3.5 - 5.1 mmol/L   Chloride 103 98 - 111 mmol/L   CO2 25 22 - 32 mmol/L   Glucose, Bld 150 (H) 70 - 99 mg/dL   BUN 15 8 - 23 mg/dL   Creatinine, Ser 4.62 (H) 0.61 - 1.24 mg/dL   Calcium 9.0 8.9 - 70.3 mg/dL   GFR calc non  Af Amer 56 (L) >60 mL/min   GFR calc Af Amer >60 >60 mL/min   Anion gap 9 5 - 15   CT HEAD WO CONTRAST  Result Date: 01/05/2020 CLINICAL DATA:  Stroke follow-up EXAM: CT HEAD WITHOUT CONTRAST TECHNIQUE: Contiguous axial images were obtained from the base of the skull through the vertex without intravenous contrast. COMPARISON:  01/04/2020 FINDINGS: Brain: There is no mass, hemorrhage or extra-axial collection. The size and configuration of the ventricles and extra-axial CSF spaces are normal. The brain parenchyma is normal, without acute or chronic infarction. Vascular: No abnormal hyperdensity of the major intracranial arteries or dural venous sinuses. No intracranial atherosclerosis. Skull: The visualized skull base, calvarium and extracranial soft tissues are normal. Sinuses/Orbits: No fluid levels or advanced mucosal thickening of the visualized paranasal sinuses. No mastoid or middle ear effusion. The orbits are normal.  IMPRESSION: Normal head CT. Electronically Signed   By: Deatra Robinson M.D.   On: 01/05/2020 00:03   CT HEAD WO CONTRAST  Result Date: 01/04/2020 CLINICAL DATA:  Stroke, follow-up. Additional provided: Patient unable to have MRI. EXAM: CT HEAD WITHOUT CONTRAST TECHNIQUE: Contiguous axial images were obtained from the base of the skull through the vertex without intravenous contrast. COMPARISON:  Noncontrast head CT, CT angiogram head/neck and CT perfusion 01/03/2020 FINDINGS: Brain: Cerebral volume is normal There is no acute intracranial hemorrhage. No demarcated cortical infarct is identified. No extra-axial fluid collection. No evidence of intracranial mass. No midline shift. Vascular: No hyperdense vessel.  Atherosclerotic calcifications Skull: Normal. Negative for fracture or focal lesion. Sinuses/Orbits: Visualized orbits show no acute finding. Mild scattered ethmoid sinus mucosal thickening. No significant mastoid effusion. IMPRESSION: No CT evidence of acute intracranial abnormality. Mild ethmoid sinus mucosal thickening. Electronically Signed   By: Jackey Loge DO   On: 01/04/2020 13:38   US Abdomen Limited RUQ  Result Date: 01/03/2020 CLINICAL DATA:  Generalized abdominal pain over the last 10 years. EXAM: ULTRASOUND ABDOMEN LIMITED RIGHT UPPER QUADRANT COMPARISON:  CT 01/01/2020 FINDINGS: Gallbladder: No gallstones or wall thickening visualized. No sonographic Murphy sign noted by sonographer. Common bile duct: Diameter: 3.8 mm, normal Liver: No focal lesion identified. Within normal limits in parenchymal echogenicity. Portal vein is patent on color Doppler imaging with normal direction of blood flow towards the liver. Other: Very small right pleural effusion is noted incidentally. Multiple right renal cysts, largest measuring almost 8 cm. IMPRESSION: 1. No abnormality seen of the gallbladder or liver. 2. Very small right pleural effusion. 3. Multiple right renal cysts, largest measuring almost 8  cm. Electronically Signed   By: Paulina Fusi M.D.   On: 01/03/2020 16:33     Assessment/Plan: Diagnosis: Chronic systolic and diastolic heart failure complicated by worsening left sided weakness of unknown origin (? Spinal stenosis).  1. Does the need for close, 24 hr/day medical supervision in concert with the patient's rehab needs make it unreasonable for this patient to be served in a less intensive setting? Yes 2. Co-Morbidities requiring supervision/potential complications: ascending aortic aneurysm, diabetes mellitus type 2, obesity BMI 33.16, atrial fibrillation, HTN, HLD 3. Due to bladder management, bowel management, safety, skin/wound care, disease management, medication administration, pain management and patient education, does the patient require 24 hr/day rehab nursing? Yes 4. Does the patient require coordinated care of a physician, rehab nurse, therapy disciplines of PT, OT to address physical and functional deficits in the context of the above medical diagnosis(es)? Yes Addressing deficits in the following areas: balance, endurance, locomotion, strength,  transferring, bowel/bladder control, bathing, dressing, feeding, grooming, toileting and psychosocial support 5. Can the patient actively participate in an intensive therapy program of at least 3 hrs of therapy per day at least 5 days per week? Yes 6. The potential for patient to make measurable gains while on inpatient rehab is excellent 7. Anticipated functional outcomes upon discharge from inpatient rehab are modified independent  with PT, modified independent with OT, independent with SLP. 8. Estimated rehab length of stay to reach the above functional goals is: 7-8 days 9. Anticipated discharge destination: Home 10. Overall Rehab/Functional Prognosis: excellent  RECOMMENDATIONS: This patient's condition is appropriate for continued rehabilitative care in the following setting: CIR Patient has agreed to participate in  recommended program. Yes Note that insurance prior authorization may be required for reimbursement for recommended care.  Comment: Kristopher Scott would be an excellent CIR candidate. Thank you for this consult. Admission coordinator to follow.   CT Head of the head shows no acute infarct. Says he has chronic left sided weakness due to radiculopathy. His weakness is currently worse than baseline as per patient. Will order XR of his lumbar spine as last done was in 2017.    I have personally performed a face to face diagnostic evaluation, including, but not limited to relevant history and physical exam findings, of this patient and developed relevant assessment and plan.  Additionally, I have reviewed and concur with the physician assistant's documentation above.  Sula Soda, MD  Jacquelynn Cree, PA-C 01/05/2020

## 2020-01-05 NOTE — Progress Notes (Signed)
Inpatient Rehab Admissions Coordinator:   At this time we are recommending a CIR consult.  Please place an order if pt would like to be considered.   Estill Dooms, PT, DPT Admissions Coordinator [phone] 01/05/20  1:22 PM

## 2020-01-05 NOTE — Progress Notes (Signed)
STROKE TEAM PROGRESS NOTE   INTERVAL HISTORY Patient is sitting in bedside chair.  He appears to be quite comfortable today.  He still complains of some subjective left-sided chest pain and hip pain and weakness which persists.  CT scan of the head shows no acute infarct.  LDL cholesterol 99 mg percent.  Hemoglobin A1c is 7.5.  ESR is 3 mm.  Vitals:   01/05/20 0347 01/05/20 0800 01/05/20 1130 01/05/20 1206  BP: 114/70 109/74 (!) 107/53 94/66  Pulse: 71 68 89 89  Resp: $Remo'18 14 14 18  'mjeeu$ Temp: 98.5 F (36.9 C) 98.1 F (36.7 C) 98.1 F (36.7 C) 98 F (36.7 C)  TempSrc: Oral Oral Oral Oral  SpO2: 98% 100% 100% 99%  Weight: 112.6 kg     Height:       CBC:  Recent Labs  Lab 01/01/20 1918 01/03/20 0624  WBC 4.7 3.5*  HGB 14.1 14.1  HCT 42.0 43.2  MCV 85.5 86.6  PLT 187 191   Basic Metabolic Panel:  Recent Labs  Lab 01/04/20 0727 01/05/20 0345  NA 136 137  K 3.9 3.5  CL 102 103  CO2 22 25  GLUCOSE 159* 150*  BUN 14 15  CREATININE 1.32* 1.36*  CALCIUM 9.1 9.0   Lipid Panel:  Recent Labs  Lab 01/04/20 0727  CHOL 164  TRIG 108  HDL 43  CHOLHDL 3.8  VLDL 22  LDLCALC 99   HgbA1c:  Recent Labs  Lab 01/04/20 0727  HGBA1C 7.5*   Urine Drug Screen: No results for input(s): LABOPIA, COCAINSCRNUR, LABBENZ, AMPHETMU, THCU, LABBARB in the last 168 hours.  Alcohol Level No results for input(s): ETH in the last 168 hours.  IMAGING past 24 hours CT HEAD WO CONTRAST  Result Date: 01/05/2020 CLINICAL DATA:  Stroke follow-up EXAM: CT HEAD WITHOUT CONTRAST TECHNIQUE: Contiguous axial images were obtained from the base of the skull through the vertex without intravenous contrast. COMPARISON:  01/04/2020 FINDINGS: Brain: There is no mass, hemorrhage or extra-axial collection. The size and configuration of the ventricles and extra-axial CSF spaces are normal. The brain parenchyma is normal, without acute or chronic infarction. Vascular: No abnormal hyperdensity of the major  intracranial arteries or dural venous sinuses. No intracranial atherosclerosis. Skull: The visualized skull base, calvarium and extracranial soft tissues are normal. Sinuses/Orbits: No fluid levels or advanced mucosal thickening of the visualized paranasal sinuses. No mastoid or middle ear effusion. The orbits are normal. IMPRESSION: Normal head CT. Electronically Signed   By: Ulyses Jarred M.D.   On: 01/05/2020 00:03    PHYSICAL EXAM Pleasant middle-aged obese African-American male not in distress. . Afebrile. Head is nontraumatic. Neck is supple without bruit.    Cardiac exam no murmur or gallop. Lungs are clear to auscultation. Distal pulses are well felt. Neurological Exam :  He is awake alert oriented to time place and person.  Speech is clear without dysarthria or aphasia.  Extraocular movements full range without nystagmus.  Blinks to threat bilaterally.  Face seems fairly symmetric except subtle left facial asymmetry when he smiles.  Tongue midline.  Motor system exam is limited due to pain in his left chest and hip there is subjective giveaway of strength testing on the left with drift but this may be related to pain.  Fine finger movements are diminished on the left compared to the right orbit site of left upper extremity.  Mild subjective weakness of left hip flexors and hand muscles but effort is quite poor.  Subjective diminished left hemibody touch and pinprick sensation including splitting the midline for both touch pinprick as well as the forehead for vibration plantars downgoing.  Gait not tested. ASSESSMENT/PLAN Mr. ORLAN AVERSA is a 64 y.o. male with history of NICM, pAfibb s/p ICD on Eliquis, DM2, HTN, HLD admitted with CP who in hospital developed L sided weakness.    Stroke: Subjective left-sided weakness and nonorganic hemisensory loss with CT scan x2 - for stroke and MRI cannot be done.  History of longstanding A. fib on anticoagulation   code Stroke CT head No acute  abnormality. ASPECTS 10.     CTA head & neck no LVO. No significant atherosclerosis. Ascending aortic aneurysm.   CT perfusion no core, no pneumbra  Repeat CT head no acute abnormality. Sinus dz.    2D Echo EF 40-45%. No source of embolus. Aortic dilitation   LDL 99   HgbA1c 7.5  VTE prophylaxis - eliquis  Eliquis (apixaban) daily prior to admission, now on Eliquis (apixaban) daily. Consider changing to pradaxa as an OP if the [facility] will cover    Therapy recommendations:  CIR  Disposition:  pending   Paroxysmal Atrial Fibrillation  Home anticoagulation:  Eliquis (apixaban) daily continued in the hospital . Continue Eliquis (apixaban) daily at discharge . Consider changing to pradaxa as an OP if the [place] will cover and co-pay does not go up significantly   Hypertension  Stable . Permissive hypertension (OK if < 220/120) but gradually normalize in 5-7 days . Long-term BP goal normotensive  Hyperlipidemia  Home meds:  lipitor 40, resumed in hospital  LDL 99, goal < 70  Increase lipitor to 80 daly  Continue statin at discharge  Diabetes type II Uncontrolled  HgbA1c 7.5, goal < 7.0  Other Stroke Risk Factors  ETOH use, advised to drink no more than 2 drink(s) a day  Obesity, Body mass index is 32.75 kg/m., recommend weight loss, diet and exercise as appropriate   NICM/chronic combined CHF s/p ICD  Other Active Problems  Atypical chest pain, not c/w ACS  AAA, stable  Hospital day # 2  He presented with sudden onset of chest pain and subjective left-sided weakness and neurological exam is limited due to giveaway weakness from pain and nonorganic pattern of sensory loss and CT head is negative for stroke.  Patient has A. fib and is on long-term anticoagulation with Eliquis I would recommend to continue that at the present time as I do not see definite benefit of changing to alternative agent.    Continue   aggressive risk factor modification.  Mobilize out of bed.   Therapy consults.  Stroke team will sign off.  Kindly call for questions.   Antony Contras, MD To contact Stroke Continuity provider, please refer to [email or web address] After hours, contact General Neurology

## 2020-01-05 NOTE — Progress Notes (Addendum)
Heart Failure Stewardship Pharmacist Progress Note   PCP: System, Pcp Not In PCP-Cardiologist: No primary care provider on file.    HPI:  64 yo M with PMH of NICM, paroxysmal afib on Eliquis, HTN, HLD, and diabetes. He presented to [facility] ED on 8/29 with chest pain. He reported exertional shortness of breath, orthopnea, and LE edema. His BNP was mildly elevated at 137.9. An ECHO was done on 01/03/20 showing LVEF of 40-45% (previously noted to be as low as 25% warranting BiV ICD placement in 2016). Patient complained of left sided weakness on 8/31. He was taken to CT but imaging showed no acute stroke. Repeat CT on 9/1 and 9/2 have shown no acute abnormalities.  Current HF Medications: Furosemide 40 mg daily Carvedilol 3.125 mg BID Entresto 24/26 mg BID Imdur 60 mg daily  Prior to admission HF Medications: Furosemide 40 mg PRN Carvedilol 3.125 mg BID Losartan 25 mg daily Imdur 60 mg daily  Pertinent Lab Values: . Serum creatinine 1.36, BUN 15, Potassium 3.5, Sodium 137, BNP 137.9  Vital Signs: . Weight: 248 lbs (admission weight: not collected) . Blood pressure: 110/70s  . Heart rate: 70-90s   Medication Assistance / Insurance Benefits Check: Does the patient have prescription insurance? Yes Type of insurance plan: [facility]  Does the patient qualify for medication assistance through manufacturers or grants?   Yes . Eligible grants and/or patient assistance programs: pending . Medication assistance applications in progress: none  . Medication assistance applications approved: none Approved medication assistance renewals will be completed by: TBD  Outpatient Pharmacy:  Prior to admission outpatient pharmacy: [facility] Is the patient willing to use [facility] TOC pharmacy at discharge? Pending Is the patient willing to transition their outpatient pharmacy to utilize a [facility] outpatient pharmacy?   Pending    Assessment: 1. Acute on chronic systolic CHF (EF 17-49%), due to NICM. NYHA  class II symptoms. - Continue furosemide 40 mg daily. Consider stopping/reducing dose and switching to SGLT2i prior to discharge. [place] Pepper is available on [place] formulary.  - Conitnue carvedilol 3.125 mg BID - Continue Entresto 24/26 mg BID - Continue Imdur 60 mg daily - Allergy noted to spironolactone (no comment on reaction). Will clarify with patient to see if we can re-challenge as outpatient  Plan: 1) Medication changes recommended at this time: - Stop/reduce furosemide - Start Jardiance 10 mg daily  2) Patient assistance application(s): - None pending  3)  Education  - To be completed prior to discharge  Sharen Hones, PharmD, BCPS Heart Failure Stewardship Pharmacist Phone [phone]

## 2020-01-05 NOTE — Progress Notes (Signed)
Progress Note  Patient Name: Kristopher Scott Date of Encounter: 01/05/2020  [facility] Cardiologist: No primary care provider on file.   Subjective   Feeling better.  No recurrent chest pain.  He had L side pain with movement.  Better with lidocaine patch.   Inpatient Medications    Scheduled Meds: . allopurinol  100 mg Oral Daily  . apixaban  5 mg Oral BID  . atorvastatin  80 mg Oral Daily  . carvedilol  3.125 mg Oral BID WC  . furosemide  40 mg Oral Daily  . isosorbide mononitrate  60 mg Oral Daily  . lidocaine  1 patch Transdermal Q24H  . prazosin  5 mg Oral QHS  . pregabalin  75 mg Oral Daily  . sacubitril-valsartan  1 tablet Oral BID   Continuous Infusions:  PRN Meds: acetaminophen, alum & mag hydroxide-simeth, HYDROcodone-acetaminophen, nitroGLYCERIN, ondansetron (ZOFRAN) IV   Vital Signs    Vitals:   01/04/20 2006 01/04/20 2358 01/05/20 0347 01/05/20 0800  BP: 126/81 97/67 114/70 109/74  Pulse: 75 87 71 68  Resp: 18 20 18 14   Temp: 98.5 F (36.9 C) 98.3 F (36.8 C) 98.5 F (36.9 C) 98.1 F (36.7 C)  TempSrc: Oral Oral Oral Oral  SpO2: 100% 97% 98% 100%  Weight:   112.6 kg   Height:        Intake/Output Summary (Last 24 hours) at 01/05/2020 0838 Last data filed at 01/04/2020 2100 Gross per 24 hour  Intake 50 ml  Output --  Net 50 ml   Last 3 Weights 01/05/2020 01/03/2020 01/03/2020  Weight (lbs) 248 lb 3.8 oz 247 lb 12.8 oz 248 lb 12.8 oz  Weight (kg) 112.6 kg 112.401 kg 112.855 kg      Telemetry    Sinus rhythm.  VP - Personally Reviewed  ECG    A sensed V paced.  Rate 67 bpm. - Personally Reviewed  Physical Exam   VS:  BP 109/74 (BP Location: Left Arm)   Pulse 68   Temp 98.1 F (36.7 C) (Oral)   Resp 14   Ht 6\' 1"  (1.854 m)   Wt 112.6 kg   SpO2 100%   BMI 32.75 kg/m  , BMI Body mass index is 32.75 kg/m. GENERAL:  Well-appearing HEENT: Pupils equal round and reactive, fundi not visualized, oral mucosa unremarkable NECK:  No  jugular venous distention, waveform within normal limits, carotid upstroke brisk and symmetric, no bruits LUNGS:  Clear to auscultation bilaterally HEART:  RRR.  PMI not displaced or sustained,S1 and S2 within normal limits, no S3, no S4, no clicks, no rubs, II/VI systolic murmur ABD:  Flat, positive bowel sounds normal in frequency in pitch, no bruits, no rebound, no guarding, no midline pulsatile mass, no hepatomegaly, no splenomegaly CHEST: +chest wall tenderness to palpation EXT:  2 plus pulses throughout, no edema, no cyanosis no clubbing SKIN:  No rashes no nodules NEURO: L sided weakness PSYCH:  Cognitively intact, oriented to person place and time  Labs    High Sensitivity Troponin:   Recent Labs  Lab 01/01/20 1918 01/02/20 0229 01/02/20 0830 01/02/20 1124 01/03/20 1202  TROPONINIHS 6 11 18* 18* 13      Chemistry Recent Labs  Lab 01/01/20 1918 01/02/20 1124 01/03/20 0624 01/03/20 1202 01/04/20 0727 01/05/20 0345  NA   < >  --  138  --  136 137  K   < >  --  3.3*  --  3.9 3.5  CL   < >  --  101  --  102 103  CO2   < >  --  29  --  22 25  GLUCOSE   < >  --  129*  --  159* 150*  BUN   < >  --  12  --  14 15  CREATININE   < >  --  1.24  --  1.32* 1.36*  CALCIUM   < >  --  8.8*  --  9.1 9.0  PROT  --  6.3*  --  6.7  --   --   ALBUMIN  --  3.4*  --  3.5  --   --   AST  --  21  --  22  --   --   ALT  --  20  --  23  --   --   ALKPHOS  --  53  --  60  --   --   BILITOT  --  1.0  --  0.8  --   --   GFRNONAA   < >  --  >60  --  58* 56*  GFRAA   < >  --  >60  --  >60 >60  ANIONGAP   < >  --  8  --  12 9   < > = values in this interval not displayed.     Hematology Recent Labs  Lab 01/01/20 1918 01/03/20 0624  WBC 4.7 3.5*  RBC 4.91 4.99  HGB 14.1 14.1  HCT 42.0 43.2  MCV 85.5 86.6  MCH 28.7 28.3  MCHC 33.6 32.6  RDW 15.6* 15.8*  PLT 187 182    BNP Recent Labs  Lab 01/02/20 2025  BNP 137.9*     DDimer No results for input(s): DDIMER in the last  168 hours.   Radiology    CT HEAD WO CONTRAST  Result Date: 01/05/2020 CLINICAL DATA:  Stroke follow-up EXAM: CT HEAD WITHOUT CONTRAST TECHNIQUE: Contiguous axial images were obtained from the base of the skull through the vertex without intravenous contrast. COMPARISON:  01/04/2020 FINDINGS: Brain: There is no mass, hemorrhage or extra-axial collection. The size and configuration of the ventricles and extra-axial CSF spaces are normal. The brain parenchyma is normal, without acute or chronic infarction. Vascular: No abnormal hyperdensity of the major intracranial arteries or dural venous sinuses. No intracranial atherosclerosis. Skull: The visualized skull base, calvarium and extracranial soft tissues are normal. Sinuses/Orbits: No fluid levels or advanced mucosal thickening of the visualized paranasal sinuses. No mastoid or middle ear effusion. The orbits are normal. IMPRESSION: Normal head CT. Electronically Signed   By: Deatra Robinson M.D.   On: 01/05/2020 00:03   CT HEAD WO CONTRAST  Result Date: 01/04/2020 CLINICAL DATA:  Stroke, follow-up. Additional provided: Patient unable to have MRI. EXAM: CT HEAD WITHOUT CONTRAST TECHNIQUE: Contiguous axial images were obtained from the base of the skull through the vertex without intravenous contrast. COMPARISON:  Noncontrast head CT, CT angiogram head/neck and CT perfusion 01/03/2020 FINDINGS: Brain: Cerebral volume is normal There is no acute intracranial hemorrhage. No demarcated cortical infarct is identified. No extra-axial fluid collection. No evidence of intracranial mass. No midline shift. Vascular: No hyperdense vessel.  Atherosclerotic calcifications Skull: Normal. Negative for fracture or focal lesion. Sinuses/Orbits: Visualized orbits show no acute finding. Mild scattered ethmoid sinus mucosal thickening. No significant mastoid effusion. IMPRESSION: No CT evidence of acute intracranial abnormality. Mild ethmoid sinus mucosal thickening. Electronically  Signed   By: Jackey Loge DO   On: 01/04/2020 13:38   CT CEREBRAL PERFUSION W CONTRAST  Result Date: 01/03/2020 CLINICAL DATA:  Neuro deficit, acute stroke suspected. EXAM: CT ANGIOGRAPHY HEAD AND NECK CT PERFUSION BRAIN TECHNIQUE: Multidetector CT imaging of the head and neck was performed using the standard protocol during bolus administration of intravenous contrast. Multiplanar CT image reconstructions and MIPs were obtained to evaluate the vascular anatomy. Carotid stenosis measurements (when applicable) are obtained utilizing NASCET criteria, using the distal internal carotid diameter as the denominator. Multiphase CT imaging of the brain was performed following IV bolus contrast injection. Subsequent parametric perfusion maps were calculated using RAPID software. CONTRAST:  OMNIPAQUE IOHEXOL 350 MG/ML SOLN COMPARISON:  Same day CT head.  CT chest from 01/01/2020. FINDINGS: CTA NECK FINDINGS Aortic arch: Ascending aortic aneurysm is better evaluated on CTA chest from 01/01/2020. Right carotid system: No evidence of dissection, stenosis (50% or greater) or occlusion. Left carotid system: No evidence of dissection, stenosis (50% or greater) or occlusion. Vertebral arteries: Left dominant. No evidence of dissection, stenosis (50% or greater) or occlusion. Skeleton: No acute abnormality. Other neck: Negative Upper chest: Clear. Review of the MIP images confirms the above findings CTA HEAD FINDINGS Anterior circulation: No significant stenosis, proximal occlusion, aneurysm, or vascular malformation. Posterior circulation: No significant stenosis, proximal occlusion, aneurysm, or vascular malformation. Venous sinuses: As permitted by contrast timing, patent. The left transverse sinus is non dominant/diminutive with arachnoid granulation in the distal transverse sinus. Anatomic variants: The ACA is partially azygos proximally. Review of the MIP images confirms the above findings CT Brain Perfusion Findings:  ASPECTS: 10 CBF (<30%) Volume: 76mL Perfusion (Tmax>6.0s) volume: 75mL Mismatch Volume: 92mL Infarction Location:None IMPRESSION: 1. No evidence of proximal large vessel occlusion, significant stenosis, or abnormal perfusion. 2. Ascending aortic aneurysm is better evaluated on CTA chest from 01/01/2020. Code stroke imaging results were communicated on 01/03/2020 at 10:50 am to provider Dr. Marchelle Folks via telephone, who verbally acknowledged these results. Electronically Signed   By: Feliberto Harts MD   On: 01/03/2020 11:08   ECHOCARDIOGRAM COMPLETE  Result Date: 01/03/2020    ECHOCARDIOGRAM REPORT   Patient Name:   Kristopher Scott Date of Exam: 01/03/2020 Medical Rec #:  [id number]         Height:       73.0 in Accession #:    [id number]        Weight:       248.8 lb Date of Birth:  Nov 05, 1955         BSA:          2.361 m Patient Age:    63 years          BP:           130/85 mmHg Patient Gender: M                 HR:           58 bpm. Exam Location:  Inpatient Procedure: 2D Echo, Cardiac Doppler and Color Doppler Indications:    Cardiomyopathy.  History:        Patient has no prior history of Echocardiogram examinations.                 CHF, Pacemaker, Signs/Symptoms:Chest Pain; Risk                 Factors:Hypertension, Dyslipidemia and Sleep Apnea.  Sonographer:    Sheralyn Boatman RDCS Referring  Phys: [id number] [facility]  Sonographer Comments: Technically difficult study due to poor echo windows. IMPRESSIONS  1. Left ventricular ejection fraction, by estimation, is 40 to 45%. The left ventricle has mildly decreased function. The left ventricle demonstrates global hypokinesis with mild septal-lateral dyssynchrony. There is moderate left ventricular hypertrophy. Left ventricular diastolic parameters are consistent with Grade I diastolic dysfunction (impaired relaxation).  2. Right ventricular systolic function is mildly reduced. The right ventricular size is normal. There is normal pulmonary artery systolic  pressure. The estimated right ventricular systolic pressure is 20.8 mmHg.  3. The mitral valve is normal in structure. Trivial mitral valve regurgitation. No evidence of mitral stenosis.  4. The aortic valve is tricuspid. Aortic valve regurgitation is mild to moderate. No aortic stenosis is present.  5. Aortic dilatation noted. There is moderate dilatation of the ascending aorta measuring 48 mm. Would consider dedicated imaging by CTA or MRA to further assess.  6. The inferior vena cava is normal in size with greater than 50% respiratory variability, suggesting right atrial pressure of 3 mmHg. FINDINGS  Left Ventricle: Left ventricular ejection fraction, by estimation, is 40 to 45%. The left ventricle has mildly decreased function. The left ventricle demonstrates global hypokinesis. The left ventricular internal cavity size was normal in size. There is  moderate left ventricular hypertrophy. Left ventricular diastolic parameters are consistent with Grade I diastolic dysfunction (impaired relaxation). Right Ventricle: The right ventricular size is normal. No increase in right ventricular wall thickness. Right ventricular systolic function is mildly reduced. There is normal pulmonary artery systolic pressure. The tricuspid regurgitant velocity is 2.11 m/s, and with an assumed right atrial pressure of 3 mmHg, the estimated right ventricular systolic pressure is 20.8 mmHg. Left Atrium: Left atrial size was normal in size. Right Atrium: Right atrial size was normal in size. Pericardium: There is no evidence of pericardial effusion. Mitral Valve: The mitral valve is normal in structure. Trivial mitral valve regurgitation. No evidence of mitral valve stenosis. Tricuspid Valve: The tricuspid valve is normal in structure. Tricuspid valve regurgitation is trivial. Aortic Valve: The aortic valve is tricuspid. Aortic valve regurgitation is mild to moderate. Aortic regurgitation PHT measures 678 msec. No aortic stenosis is  present. Pulmonic Valve: The pulmonic valve was normal in structure. Pulmonic valve regurgitation is not visualized. Aorta: Aortic dilatation noted. There is moderate dilatation of the ascending aorta measuring 48 mm. Venous: The inferior vena cava is normal in size with greater than 50% respiratory variability, suggesting right atrial pressure of 3 mmHg. IAS/Shunts: No atrial level shunt detected by color flow Doppler. Additional Comments: A pacer wire is visualized in the right ventricle.  LEFT VENTRICLE PLAX 2D LVIDd:         5.00 cm      Diastology LVIDs:         3.85 cm      LV e' lateral:   9.00 cm/s LV PW:         1.40 cm      LV E/e' lateral: 6.3 LV IVS:        1.50 cm      LV e' medial:    5.84 cm/s LVOT diam:     2.40 cm      LV E/e' medial:  9.7 LVOT Area:     4.52 cm  LV Volumes (MOD) LV vol d, MOD A2C: 145.0 ml LV vol d, MOD A4C: 164.5 ml LV vol s, MOD A2C: 67.1 ml LV vol s, MOD A4C: 79.8  ml LV SV MOD A2C:     77.9 ml LV SV MOD A4C:     164.5 ml LV SV MOD BP:      82.4 ml RIGHT VENTRICLE            IVC RV S prime:     5.68 cm/s  IVC diam: 1.70 cm TAPSE (M-mode): 1.6 cm LEFT ATRIUM             Index       RIGHT ATRIUM           Index LA diam:        3.70 cm 1.57 cm/m  RA Area:     16.90 cm LA Vol (A2C):   51.9 ml 21.98 ml/m RA Volume:   43.20 ml  18.30 ml/m LA Vol (A4C):   29.5 ml 12.50 ml/m LA Biplane Vol: 41.4 ml 17.54 ml/m  AORTIC VALVE AI PHT:      678 msec  AORTA Ao Root diam: 4.70 cm Ao Asc diam:  4.75 cm MITRAL VALVE               TRICUSPID VALVE MV Area (PHT): 1.66 cm    TR Peak grad:   17.8 mmHg MV Decel Time: 458 msec    TR Vmax:        211.00 cm/s MV E velocity: 56.60 cm/s MV A velocity: 81.00 cm/s  SHUNTS MV E/A ratio:  0.70        Systemic Diam: 2.40 cm Marca Ancona MD Electronically signed by Marca Ancona MD Signature Date/Time: 01/03/2020/12:27:41 PM    Final    CT HEAD CODE STROKE WO CONTRAST  Result Date: 01/03/2020 CLINICAL DATA:  Code stroke.  Neuro deficit. EXAM: CT HEAD  WITHOUT CONTRAST TECHNIQUE: Contiguous axial images were obtained from the base of the skull through the vertex without intravenous contrast. COMPARISON:  CT head 01/25/2013 FINDINGS: Brain: No evidence of acute infarction, hemorrhage, hydrocephalus, extra-axial collection or mass lesion/mass effect. Vascular: No hyperdense vessel or unexpected calcification. Calcific intracranial atherosclerosis. Skull: Normal. Negative for fracture or focal lesion. Sinuses/Orbits: No acute finding. Other: No mastoid effusions. ASPECTS ([place] Stroke Program Early CT Score):10 IMPRESSION: 1. WNo acute hemorrhage or evidence of large vascular territory infarct. 2. ASPECTS is 10 Code stroke imaging results were communicated on 01/03/2020 at 10:38 am to provider Dr. Derry Lory Via telephone, who verbally acknowledged these results. Electronically Signed   By: Feliberto Harts MD   On: 01/03/2020 10:42   CT ANGIO HEAD CODE STROKE  Result Date: 01/03/2020 CLINICAL DATA:  Neuro deficit, acute stroke suspected. EXAM: CT ANGIOGRAPHY HEAD AND NECK CT PERFUSION BRAIN TECHNIQUE: Multidetector CT imaging of the head and neck was performed using the standard protocol during bolus administration of intravenous contrast. Multiplanar CT image reconstructions and MIPs were obtained to evaluate the vascular anatomy. Carotid stenosis measurements (when applicable) are obtained utilizing NASCET criteria, using the distal internal carotid diameter as the denominator. Multiphase CT imaging of the brain was performed following IV bolus contrast injection. Subsequent parametric perfusion maps were calculated using RAPID software. CONTRAST:  OMNIPAQUE IOHEXOL 350 MG/ML SOLN COMPARISON:  Same day CT head.  CT chest from 01/01/2020. FINDINGS: CTA NECK FINDINGS Aortic arch: Ascending aortic aneurysm is better evaluated on CTA chest from 01/01/2020. Right carotid system: No evidence of dissection, stenosis (50% or greater) or occlusion. Left carotid  system: No evidence of dissection, stenosis (50% or greater) or occlusion. Vertebral arteries: Left dominant. No evidence of dissection,  stenosis (50% or greater) or occlusion. Skeleton: No acute abnormality. Other neck: Negative Upper chest: Clear. Review of the MIP images confirms the above findings CTA HEAD FINDINGS Anterior circulation: No significant stenosis, proximal occlusion, aneurysm, or vascular malformation. Posterior circulation: No significant stenosis, proximal occlusion, aneurysm, or vascular malformation. Venous sinuses: As permitted by contrast timing, patent. The left transverse sinus is non dominant/diminutive with arachnoid granulation in the distal transverse sinus. Anatomic variants: The ACA is partially azygos proximally. Review of the MIP images confirms the above findings CT Brain Perfusion Findings: ASPECTS: 10 CBF (<30%) Volume: 45mL Perfusion (Tmax>6.0s) volume: 47mL Mismatch Volume: 63mL Infarction Location:None IMPRESSION: 1. No evidence of proximal large vessel occlusion, significant stenosis, or abnormal perfusion. 2. Ascending aortic aneurysm is better evaluated on CTA chest from 01/01/2020. Code stroke imaging results were communicated on 01/03/2020 at 10:50 am to provider Dr. Marchelle Folks via telephone, who verbally acknowledged these results. Electronically Signed   By: Feliberto Harts MD   On: 01/03/2020 11:08   CT ANGIO NECK CODE STROKE  Result Date: 01/03/2020 CLINICAL DATA:  Neuro deficit, acute stroke suspected. EXAM: CT ANGIOGRAPHY HEAD AND NECK CT PERFUSION BRAIN TECHNIQUE: Multidetector CT imaging of the head and neck was performed using the standard protocol during bolus administration of intravenous contrast. Multiplanar CT image reconstructions and MIPs were obtained to evaluate the vascular anatomy. Carotid stenosis measurements (when applicable) are obtained utilizing NASCET criteria, using the distal internal carotid diameter as the denominator. Multiphase CT  imaging of the brain was performed following IV bolus contrast injection. Subsequent parametric perfusion maps were calculated using RAPID software. CONTRAST:  OMNIPAQUE IOHEXOL 350 MG/ML SOLN COMPARISON:  Same day CT head.  CT chest from 01/01/2020. FINDINGS: CTA NECK FINDINGS Aortic arch: Ascending aortic aneurysm is better evaluated on CTA chest from 01/01/2020. Right carotid system: No evidence of dissection, stenosis (50% or greater) or occlusion. Left carotid system: No evidence of dissection, stenosis (50% or greater) or occlusion. Vertebral arteries: Left dominant. No evidence of dissection, stenosis (50% or greater) or occlusion. Skeleton: No acute abnormality. Other neck: Negative Upper chest: Clear. Review of the MIP images confirms the above findings CTA HEAD FINDINGS Anterior circulation: No significant stenosis, proximal occlusion, aneurysm, or vascular malformation. Posterior circulation: No significant stenosis, proximal occlusion, aneurysm, or vascular malformation. Venous sinuses: As permitted by contrast timing, patent. The left transverse sinus is non dominant/diminutive with arachnoid granulation in the distal transverse sinus. Anatomic variants: The ACA is partially azygos proximally. Review of the MIP images confirms the above findings CT Brain Perfusion Findings: ASPECTS: 10 CBF (<30%) Volume: 58mL Perfusion (Tmax>6.0s) volume: 81mL Mismatch Volume: 22mL Infarction Location:None IMPRESSION: 1. No evidence of proximal large vessel occlusion, significant stenosis, or abnormal perfusion. 2. Ascending aortic aneurysm is better evaluated on CTA chest from 01/01/2020. Code stroke imaging results were communicated on 01/03/2020 at 10:50 am to provider Dr. Marchelle Folks via telephone, who verbally acknowledged these results. Electronically Signed   By: Feliberto Harts MD   On: 01/03/2020 11:08   US Abdomen Limited RUQ  Result Date: 01/03/2020 CLINICAL DATA:  Generalized abdominal pain over the  last 10 years. EXAM: ULTRASOUND ABDOMEN LIMITED RIGHT UPPER QUADRANT COMPARISON:  CT 01/01/2020 FINDINGS: Gallbladder: No gallstones or wall thickening visualized. No sonographic Murphy sign noted by sonographer. Common bile duct: Diameter: 3.8 mm, normal Liver: No focal lesion identified. Within normal limits in parenchymal echogenicity. Portal vein is patent on color Doppler imaging with normal direction of blood flow towards the  liver. Other: Very small right pleural effusion is noted incidentally. Multiple right renal cysts, largest measuring almost 8 cm. IMPRESSION: 1. No abnormality seen of the gallbladder or liver. 2. Very small right pleural effusion. 3. Multiple right renal cysts, largest measuring almost 8 cm. Electronically Signed   By: Paulina Fusi M.D.   On: 01/03/2020 16:33    Cardiac Studies   Echo pending  Patient Profile     Mr. Arnall is a 64M with chronic systolic and diastolic heart failure (NICM), paroxysmal atrial fibrillation, s/p ICD, hypertension, hyperlipidemia and diabetes admitted with chest pain.   Assessment & Plan    # L sided weakness:  Head CT without acute findings x2.    # NICM: # chronic systolic and diastolic heart failure: # s/p CRT-D:  Mr. Spieker was mildly volume overloaded on initial presentation.  His OptiVol levels were increasing on his ICD.  He is diuresing and renal failure is stable on IV Lasix.  BNP was only mildly elevated to 138.  Continue carvedilol, losartan, and Imdur.  # Atypical chest pain: His chest pain is very atypical and he is quite tender to palpation on exam.  Overall I still do not think his presentation is consistent with ACS.  High-sensitivity troponin is minimally elevated to 18 and flat.  Unable to interpret his EKG due to ventricular pacing.  He has a history of nonischemic cardiomyopathy with minimal plaque verified on his cath from 2015.  No plan for an ischemia evaluation.  Symptoms have improved with lidocaine patch. He  is no longer getting IV pain medication.   # Essential hypertension:  Blood pressure well-controlled on carvedilol and Imdur.  Losartan was switched to [facility].   # PAF:  Currently in sinus rhythm.  Continue carvedilol and Eliquis. There was consideration of switching to Pradaxa.  Agree that given lack of evidence of an embolic event the utility of switching is low.  Continue Eliquis.   # Hyperlipidemia: Continue statin.  LDL 99.  Atorvastatin was increased to .  Repeat lipids/CMP in 2-3 months.      For questions or updates, please contact [facility] Please consult [email or web address] for contact info under        Signed, Chilton Si, MD  01/05/2020, 8:38 AM

## 2020-01-05 NOTE — Progress Notes (Signed)
Physical Therapy Treatment Patient Details Name: Kristopher Scott MRN: [id number] DOB: 1956-04-07 Today's Date: 01/05/2020    History of Present Illness 64 y.o. male with PMH significant for NICM with EF 25%, pAfib s/p ICD on Eliquis, DM2, HTN, HLD, (per patient--LLE weakness from injury 1981, migraine/cluster headaches) who presented to ED for Chest pain 01/01/20. CT angio chest showed 4.8 cm ascending aortic aneurysm. Cardiology consult with troponin mildly elevating--stated "symptoms concerning to unstable angina ? with pleuritic component." He had acute onset L sided weakness with a LKW of 0930 on 01/03/20. NIHSS 4; CT head, CT angio head/neck, CT cerebral perfusion negative and unable to get MRI due to defibrillator. Repeat head CT negative.     PT Comments    Patient demonstrated slower processing and difficulty completing thoughts/phrases than when seen 9/1. His gait was much slower as he demonstrated incr difficulty advancing LLE. With nearly constant cues could improve left foot clearance (although still "sliding" foot forward). Cues to "step to" with RLE to reduce risk of left knee buckling and pt did not have knee buckling today. Became lightheaded/dizzy after 30 feet and required seated rest. Noted HR on initial sitting 104 regular and quickly decreased to 90 bpm with pt reporting less dizziness. While seated EOB, elevated bed so feet not touching the floor and reassessed left knee extension and flexion (same as 9/1--extension 2+, flexion 3-). Attempted rapid moving from left knee extension to flexion with varying assist by PT and even resistance at times due to ? patient effort and his strength performance remained consistent. (PT was questioning effort as he physically demonstrated he was putting forth great effort-heavier breathing, wincing face, stabilizing with bil UEs on bed) and HR only 84 bpm. Unclear how far he is from his baseline as he had LLE weakness prior to recent admission,  however he reports the weakness is worse and his gait is 20% of his normal.    Follow Up Recommendations  CIR     Equipment Recommendations  Other (comment) (TBD next venue; ?wheelchair due to falls)    Recommendations for Other Services Rehab consult     Precautions / Restrictions Precautions Precautions: Fall Precaution Comments: h/o falls due to left knee buckling (since early 1980s); fell 5x last month (more than usual per pt)    Mobility  Bed Mobility               General bed mobility comments: OOB in recliner upon entry   Transfers Overall transfer level: Needs assistance Equipment used: Rolling walker (2 wheeled) Transfers: Sit to/from Stand Sit to Stand: Min assist         General transfer comment: Min assist to stabilize RW and pt as he transitions hands to RW: from recliner, heavy use of UEs to power up and min guard to stedy until able to pull L LE underneath him  Ambulation/Gait Ambulation/Gait assistance: Min assist Gait Distance (Feet): 30 Feet (seated rest; 4 ft) Assistive device: Rolling walker (2 wheeled) Gait Pattern/deviations: Step-to pattern;Decreased step length - left;Decreased dorsiflexion - left;Decreased step length - right Gait velocity: required 15 minutes to walk 30 ft (stopping rest, stopping for cuing from PT, very slow advancing LLE) Gait velocity interpretation: <1.31 ft/sec, indicative of household ambulator General Gait Details: Pt required cues to recall reaching forward with left heel to improve foot clearance (still drags left foot, but does show some dorsiflexion); again required cues to NOT step rt foot past left foot to reduce left knee buckling (with  instruction, had no episodes of buckling during gait); pt supporting himself with bil UEs via RW; vc for pt to stay closer to RW (he is used to using rollator)   Social research officer, government Rankin (Stroke Patients Only) Modified Rankin (Stroke  Patients Only) Pre-Morbid Rankin Score: Moderately severe disability Modified Rankin: Moderately severe disability     Balance Overall balance assessment: Needs assistance Sitting-balance support: No upper extremity supported;Feet unsupported Sitting balance-Leahy Scale: Good Sitting balance - Comments: able to sit without feet supported and "walk hips" back onto bed   Standing balance support: Bilateral upper extremity supported;During functional activity Standing balance-Leahy Scale: Poor Standing balance comment: relies on B UE support, limited assessment due to L thigh pain today                            Cognition Arousal/Alertness: Awake/alert Behavior During Therapy: WFL for tasks assessed/performed Overall Cognitive Status: Impaired/Different from baseline Area of Impairment: Awareness;Problem solving;Memory;Following commands                 Orientation Level:  (NT)   Memory:  (?language vs memory; difficulty recalling what wants to say) Following Commands: Follows one step commands consistently;Follows one step commands with increased time;Follows multi-step commands inconsistently (following commands very slow, almost labored)   Awareness: Emergent Problem Solving: Slow processing;Requires verbal cues General Comments: increased time for processing, problem solving      Exercises General Exercises - Lower Extremity Long Arc Quad: AAROM;Strengthening;Left;10 reps;Seated (attempted quick alternating knee ext/flex with poor timing/c) Heel Slides: AROM;Both;10 reps;Seated (prior to standing/walking to decr knee pain/stiffness)    General Comments        Pertinent Vitals/Pain Pain Assessment: 0-10 Pain Score: 4  Pain Location: headache Pain Descriptors / Indicators: Discomfort Pain Intervention(s): Limited activity within patient's tolerance;Monitored during session    Home Living                      Prior Function             PT Goals (current goals can now be found in the care plan section) Acute Rehab PT Goals Patient Stated Goal: get stronger PT Goal Formulation: With patient Time For Goal Achievement: 01/18/20 Potential to Achieve Goals: Good Progress towards PT goals: Not progressing toward goals - comment (demonstrating worse LLE function in gait; worse foot clearan)    Frequency    Min 4X/week      PT Plan Current plan remains appropriate    Co-evaluation              AM-PAC PT "6 Clicks" Mobility   Outcome Measure  Help needed turning from your back to your side while in a flat bed without using bedrails?: None Help needed moving from lying on your back to sitting on the side of a flat bed without using bedrails?: A Little Help needed moving to and from a bed to a chair (including a wheelchair)?: A Little Help needed standing up from a chair using your arms (e.g., wheelchair or bedside chair)?: A Little Help needed to walk in hospital room?: A Little Help needed climbing 3-5 steps with a railing? : Total 6 Click Score: 17    End of Session Equipment Utilized During Treatment: Gait belt Activity Tolerance: Patient limited by fatigue;Treatment limited secondary to medical complications (Comment) (became dizzy  with 30 ft of gait; required seated rest) Patient left: in chair;with call bell/phone within reach;with chair alarm set Nurse Communication: Mobility status;Other (comment) (dizzy spell in standing HR 104 reg; quickly down to 90 in si) PT Visit Diagnosis: Repeated falls (R29.6);Other abnormalities of gait and mobility (R26.89);Muscle weakness (generalized) (M62.81)     Time: 4599-7741 PT Time Calculation (min) (ACUTE ONLY): 44 min  Charges:  $Gait Training: 23-37 mins $Therapeutic Exercise: 8-22 mins                      Jerolyn Center, PT Pager [phone]    Zena Amos 01/05/2020, 11:46 AM

## 2020-01-06 ENCOUNTER — Inpatient Hospital Stay (HOSPITAL_COMMUNITY): Payer: No Typology Code available for payment source

## 2020-01-06 DIAGNOSIS — R531 Weakness: Secondary | ICD-10-CM

## 2020-01-06 LAB — BASIC METABOLIC PANEL
Anion gap: 10 (ref 5–15)
BUN: 15 mg/dL (ref 8–23)
CO2: 22 mmol/L (ref 22–32)
Calcium: 9.6 mg/dL (ref 8.9–10.3)
Chloride: 104 mmol/L (ref 98–111)
Creatinine, Ser: 1.31 mg/dL — ABNORMAL HIGH (ref 0.61–1.24)
GFR calc Af Amer: >60 mL/min (ref >60)
GFR calc non Af Amer: 58 mL/min — ABNORMAL LOW (ref >60)
Glucose, Bld: 152 mg/dL — ABNORMAL HIGH (ref 70–99)
Potassium: 4.1 mmol/L (ref 3.5–5.1)
Sodium: 136 mmol/L (ref 135–145)

## 2020-01-06 NOTE — Progress Notes (Signed)
Heart Failure Stewardship Pharmacist Progress Note   PCP: System, Pcp Not In PCP-Cardiologist: No primary care provider on file.    HPI:  64 yo M with PMH of NICM, paroxysmal afib on Eliquis, HTN, HLD, and diabetes. He presented to [facility] ED on 8/29 with chest pain. He reported exertional shortness of breath, orthopnea, and LE edema. His BNP was mildly elevated at 137.9. An ECHO was done on 01/03/20 showing LVEF of 40-45% (previously noted to be as low as 25% warranting BiV ICD placement in 2016). Patient complained of left sided weakness on 8/31. He was taken to CT but imaging showed no acute stroke. Repeat CT on 9/1 and 9/2 have shown no acute abnormalities. Pending CIR placement.  Current HF Medications: Furosemide 20 mg daily Carvedilol 3.125 mg BID Entresto 24/26 mg BID Jardiance 10 mg daily Imdur 60 mg daily  Prior to admission HF Medications: Furosemide 40 mg PRN Carvedilol 3.125 mg BID Losartan 25 mg daily Imdur 60 mg daily  Pertinent Lab Values: . 9/3: labs pending  . As of 9/2: Serum creatinine 1.36, BUN 15, Potassium 3.5, Sodium 137, BNP 137.9  Vital Signs: . Weight: 251 lbs (admission weight: not collected) . Blood pressure: 100-110/70s  . Heart rate: 80s   Medication Assistance / Insurance Benefits Check: Does the patient have prescription insurance? Yes Type of insurance plan: VA  Outpatient Pharmacy:  Prior to admission outpatient pharmacy: [facility] Is the patient willing to use [facility] TOC pharmacy at discharge? Pending Is the patient willing to transition their outpatient pharmacy to utilize a [facility] outpatient pharmacy?   No - patient will use [facility] pharmacy    Assessment: 1. Acute on chronic systolic CHF (EF 95-28%), due to NICM. NYHA class II symptoms. - Continue furosemide 20 mg daily - Conitnue carvedilol 3.125 mg BID - Continue Entresto 24/26 mg BID - Continue Jardiance 10 mg daily - Continue Imdur 60 mg daily - Allergy noted to spironolactone (no  comment on reaction). Will clarify with patient to see if we can re-challenge as outpatient  Plan: 1) Medication changes recommended at this time: - No changes to current regimen  2) Patient assistance application(s): - None pending  3)  Education  - To be completed prior to discharge  Sharen Hones, PharmD, BCPS Heart Failure Stewardship Pharmacist Phone [phone]

## 2020-01-06 NOTE — Progress Notes (Signed)
[facility] IP rehab admissions - I met with patient.  He prefers to go home with outpatient therapy if possible.  He wants to be able to use his VA benefits and not come to CIR here at [facility] and have to use his [employer].  If he has to go to a rehab facility, he wants to go somewhere that is in network with his [place].  I have made case manager, Vida Roller, aware of patient desires.  I will not pursue CIR here at [facility] since we do not accept [place] insurance for our inpatient rehab unit.  Call me for questions.  [phone]

## 2020-01-06 NOTE — Progress Notes (Signed)
Progress Note  Patient Name: Kristopher Scott Date of Encounter: 01/06/2020  [facility] Cardiologist: No primary care provider on file.   Subjective   Feeling better.  No recurrent chest pain.  He continues to have some left side pain.  Strength is improving.   Inpatient Medications    Scheduled Meds: . allopurinol  100 mg Oral Daily  . apixaban  5 mg Oral BID  . atorvastatin  80 mg Oral Daily  . carvedilol  3.125 mg Oral BID WC  . empagliflozin  10 mg Oral Daily  . furosemide  20 mg Oral Daily  . isosorbide mononitrate  60 mg Oral Daily  . lidocaine  1 patch Transdermal Q24H  . prazosin  5 mg Oral QHS  . pregabalin  75 mg Oral Daily  . sacubitril-valsartan  1 tablet Oral BID   Continuous Infusions:  PRN Meds: acetaminophen, alum & mag hydroxide-simeth, HYDROcodone-acetaminophen, nitroGLYCERIN, ondansetron (ZOFRAN) IV   Vital Signs    Vitals:   01/05/20 2317 01/06/20 0338 01/06/20 0500 01/06/20 0751  BP: 108/80 106/65  112/79  Pulse: 85 65  76  Resp: 18 18  18   Temp: 98.4 F (36.9 C) 98 F (36.7 C)  98.2 F (36.8 C)  TempSrc: Oral Oral  Oral  SpO2: 98% 100%  100%  Weight:   114 kg   Height:        Intake/Output Summary (Last 24 hours) at 01/06/2020 0912 Last data filed at 01/06/2020 0600 Gross per 24 hour  Intake 960 ml  Output 500 ml  Net 460 ml   Last 3 Weights 01/06/2020 01/05/2020 01/03/2020  Weight (lbs) 251 lb 5.2 oz 248 lb 3.8 oz 247 lb 12.8 oz  Weight (kg) 114 kg 112.6 kg 112.401 kg      Telemetry    Sinus rhythm.  VP - Personally Reviewed  ECG    A sensed V paced.  Rate 67 bpm. - Personally Reviewed  Physical Exam   VS:  BP 112/79 (BP Location: Left Arm)   Pulse 76   Temp 98.2 F (36.8 C) (Oral)   Resp 18   Ht 6\' 1"  (1.854 m)   Wt 114 kg   SpO2 100%   BMI 33.16 kg/m  , BMI Body mass index is 33.16 kg/m. GENERAL:  Well-appearing HEENT: Pupils equal round and reactive, fundi not visualized, oral mucosa unremarkable NECK:  No  jugular venous distention, waveform within normal limits, carotid upstroke brisk and symmetric, no bruits LUNGS:  Clear to auscultation bilaterally HEART:  RRR.  PMI not displaced or sustained,S1 and S2 within normal limits, no S3, no S4, no clicks, no rubs, II/VI systolic murmur ABD:  Flat, positive bowel sounds normal in frequency in pitch, no bruits, no rebound, no guarding, no midline pulsatile mass, no hepatomegaly, no splenomegaly EXT:  2 plus pulses throughout, no edema, no cyanosis no clubbing SKIN:  No rashes no nodules NEURO: L sided weakness PSYCH:  Cognitively intact, oriented to person place and time  Labs    High Sensitivity Troponin:   Recent Labs  Lab 01/01/20 1918 01/02/20 0229 01/02/20 0830 01/02/20 1124 01/03/20 1202  TROPONINIHS 6 11 18* 18* 13      Chemistry Recent Labs  Lab 01/01/20 1918 01/02/20 1124 01/03/20 0624 01/03/20 1202 01/04/20 0727 01/05/20 0345  NA   < >  --  138  --  136 137  K   < >  --  3.3*  --  3.9 3.5  CL   < >  --  101  --  102 103  CO2   < >  --  29  --  22 25  GLUCOSE   < >  --  129*  --  159* 150*  BUN   < >  --  12  --  14 15  CREATININE   < >  --  1.24  --  1.32* 1.36*  CALCIUM   < >  --  8.8*  --  9.1 9.0  PROT  --  6.3*  --  6.7  --   --   ALBUMIN  --  3.4*  --  3.5  --   --   AST  --  21  --  22  --   --   ALT  --  20  --  23  --   --   ALKPHOS  --  53  --  60  --   --   BILITOT  --  1.0  --  0.8  --   --   GFRNONAA   < >  --  >60  --  58* 56*  GFRAA   < >  --  >60  --  >60 >60  ANIONGAP   < >  --  8  --  12 9   < > = values in this interval not displayed.     Hematology Recent Labs  Lab 01/01/20 1918 01/03/20 0624  WBC 4.7 3.5*  RBC 4.91 4.99  HGB 14.1 14.1  HCT 42.0 43.2  MCV 85.5 86.6  MCH 28.7 28.3  MCHC 33.6 32.6  RDW 15.6* 15.8*  PLT 187 182    BNP Recent Labs  Lab 01/02/20 2025  BNP 137.9*     DDimer No results for input(s): DDIMER in the last 168 hours.   Radiology    CT HEAD WO  CONTRAST  Result Date: 01/05/2020 CLINICAL DATA:  Stroke follow-up EXAM: CT HEAD WITHOUT CONTRAST TECHNIQUE: Contiguous axial images were obtained from the base of the skull through the vertex without intravenous contrast. COMPARISON:  01/04/2020 FINDINGS: Brain: There is no mass, hemorrhage or extra-axial collection. The size and configuration of the ventricles and extra-axial CSF spaces are normal. The brain parenchyma is normal, without acute or chronic infarction. Vascular: No abnormal hyperdensity of the major intracranial arteries or dural venous sinuses. No intracranial atherosclerosis. Skull: The visualized skull base, calvarium and extracranial soft tissues are normal. Sinuses/Orbits: No fluid levels or advanced mucosal thickening of the visualized paranasal sinuses. No mastoid or middle ear effusion. The orbits are normal. IMPRESSION: Normal head CT. Electronically Signed   By: Deatra Robinson M.D.   On: 01/05/2020 00:03   CT HEAD WO CONTRAST  Result Date: 01/04/2020 CLINICAL DATA:  Stroke, follow-up. Additional provided: Patient unable to have MRI. EXAM: CT HEAD WITHOUT CONTRAST TECHNIQUE: Contiguous axial images were obtained from the base of the skull through the vertex without intravenous contrast. COMPARISON:  Noncontrast head CT, CT angiogram head/neck and CT perfusion 01/03/2020 FINDINGS: Brain: Cerebral volume is normal There is no acute intracranial hemorrhage. No demarcated cortical infarct is identified. No extra-axial fluid collection. No evidence of intracranial mass. No midline shift. Vascular: No hyperdense vessel.  Atherosclerotic calcifications Skull: Normal. Negative for fracture or focal lesion. Sinuses/Orbits: Visualized orbits show no acute finding. Mild scattered ethmoid sinus mucosal thickening. No significant mastoid effusion. IMPRESSION: No CT evidence of acute intracranial abnormality. Mild ethmoid sinus mucosal thickening. Electronically Signed  By: Jackey Loge DO   On:  01/04/2020 13:38    Cardiac Studies   Echo pending  Patient Profile     Kristopher Scott is a 34M with chronic systolic and diastolic heart failure (NICM), paroxysmal atrial fibrillation, s/p ICD, hypertension, hyperlipidemia and diabetes admitted with chest pain.   Assessment & Plan    # L sided weakness:  Head CT without acute findings x2.  Likely 2/2 chronic pain pain/nerve injury.  Going to CIR.  # NICM: # chronic systolic and diastolic heart failure: # s/p CRT-D:  Mr. Rosenbloom was mildly volume overloaded on initial presentation.  His OptiVol levels were increasing on his ICD.  Stable on oral lasix.  Dose was reduced 2/2 starting Entresto and Jardiance.  BNP was only mildly elevated to 138.  Continue carvedilol and Imdur.  Scheduled to have a lead revision at [facility].  Will need to reschedule after CIR.  Device thresholds were stable on this admission.  LVEF this admission increased to 40-45% from <30% previously.  He is euvolemic.   # Atypical chest pain: His chest pain is very atypical and he is quite tender to palpation on exam.  Overall I still do not think his presentation is consistent with ACS.  High-sensitivity troponin is minimally elevated to 18 and flat.  Unable to interpret his EKG due to ventricular pacing.  He has a history of nonischemic cardiomyopathy with minimal plaque verified on his cath from 2015.  No plan for an ischemia evaluation.  Symptoms have improved with lidocaine patch. He is no longer getting IV pain medication.   # Essential hypertension:  Blood pressure well-controlled on carvedilol and Imdur.  Losartan was switched to [facility].   # PAF:  Currently in sinus rhythm.  Continue carvedilol and Eliquis. There was consideration of switching to Pradaxa.  Agree that given lack of evidence of an embolic event the utility of switching is low.  Continue Eliquis.   # Hyperlipidemia: Continue statin.  LDL 99.  Atorvastatin was increased to 80mg .  Repeat lipids/CMP in  2-3 months.    # Dispo: Medically stable for discharge when bed is available at CIR. [facility]  For questions or updates, please contact [facility] Please consult [email or web address] for contact info under        Signed, , MD  01/06/2020, 9:12 AM

## 2020-01-06 NOTE — TOC Progression Note (Addendum)
Transition of Care [facility]) - Progression Note    Patient Details  Name: Kristopher Scott MRN: [id number] Date of Birth: [birth year]  [facility] CM/SW Contact  Kermit Balo, RN Phone Number: 01/06/2020, 3:55 PM  Clinical Narrative:    Pt has decided he doesn't want to do therapy at [facility]. Pt has decided he prefers outpatient therapy and his wife is able to provide needed transportation. CM updated MD and she was in agreement. Orders in Epic and information on the AVS. CM provided him the entresto 30 day free card. Wife to provide transport home when medically ready.   Expected Discharge Plan: IP Rehab Facility Barriers to Discharge: Continued Medical Work up  Expected Discharge Plan and Services Expected Discharge Plan: IP Rehab Facility   Discharge Planning Services: CM Consult Post Acute Care Choice: IP Rehab Living arrangements for the past 2 months: Single Family Home                                       Social Determinants of Health (SDOH) Interventions    Readmission Risk Interventions No flowsheet data found.

## 2020-01-06 NOTE — Plan of Care (Signed)
  Problem: Education: Goal: Ability to demonstrate management of disease process will improve Outcome: Progressing   Problem: Activity: Goal: Capacity to carry out activities will improve Outcome: Progressing   Problem: Education: Goal: Knowledge of General Education information will improve Description: Including pain rating scale, medication(s)/side effects and non-pharmacologic comfort measures Outcome: Progressing   

## 2020-01-06 NOTE — Progress Notes (Signed)
Physical Therapy Treatment Patient Details Name: Kristopher Scott MRN: [id number] DOB: 1955/07/20 Today's Date: 01/06/2020    History of Present Illness 64 y.o. male with PMH significant for NICM with EF 25%, pAfib s/p ICD on Eliquis, DM2, HTN, HLD, (per patient--LLE weakness from injury 1981, migraine/cluster headaches) who presented to ED for Chest pain 01/01/20. CT angio chest showed 4.8 cm ascending aortic aneurysm. Cardiology consult with troponin mildly elevating--stated "symptoms concerning to unstable angina ? with pleuritic component." He had acute onset L sided weakness with a LKW of 0930 on 01/03/20. NIHSS 4; CT head, CT angio head/neck, CT cerebral perfusion negative and unable to get MRI due to defibrillator. Repeat head CT negative.     PT Comments    Pt progressing with tolerance for OOB activity, ambulating a total of 60 ft with x1 seated rest break to recover fatigue and LE weakness L>R. Pt with increased L knee buckling when fatigued, pt able to correct for this with light PT assist. PT instructed pt in LE exercise and x2 STSs during session to increase LE strength. Pt motivated to d/c to CIR level of care post-acutely, will continue to follow acutely.     Follow Up Recommendations  CIR     Equipment Recommendations  Other (comment) (TBD next venue; ?wheelchair due to falls)    Recommendations for Other Services Rehab consult     Precautions / Restrictions Precautions Precautions: Fall Precaution Comments: h/o falls due to left knee buckling (since early 1980s); fell 5x last month (more than usual per pt) Restrictions Weight Bearing Restrictions: No    Mobility  Bed Mobility Overal bed mobility: Needs Assistance             General bed mobility comments: OOB in recliner upon entry   Transfers Overall transfer level: Needs assistance Equipment used: Rolling walker (2 wheeled) Transfers: Sit to/from Stand Sit to Stand: Min assist;Mod assist          General transfer comment: min assist to power up, steady, pt walking LEs under BOS in series of weight shifts. Verbal cuing for hand placement when rising and sitting, backing up to sitting surface and feeling chair before sitting down. STS x2, from recliner and chair in room (mod A for standing from lower surface).  Ambulation/Gait Ambulation/Gait assistance: Min assist Gait Distance (Feet): 50 Feet (+10 after seated rest break) Assistive device: Rolling walker (2 wheeled) Gait Pattern/deviations: Step-to pattern;Step-through pattern;Decreased stride length;Decreased step length - left;Decreased dorsiflexion - left Gait velocity: decr   General Gait Details: Min assist to steady, verbal cuing for upright posture x3 with tactile cuing at sternum, placement in RW, leading with LLE, and increased DF LLE to increase foot clearance during swing phase of gait. Increased buckling LLE with further gait distance, pt-recovered.   Stairs             Wheelchair Mobility    Modified Rankin (Stroke Patients Only) Modified Rankin (Stroke Patients Only) Pre-Morbid Rankin Score: Moderately severe disability Modified Rankin: Moderately severe disability     Balance Overall balance assessment: Needs assistance Sitting-balance support: No upper extremity supported;Feet unsupported Sitting balance-Leahy Scale: Good     Standing balance support: Bilateral upper extremity supported;During functional activity Standing balance-Leahy Scale: Poor Standing balance comment: relies on B UE support                            Cognition Arousal/Alertness: Awake/alert Behavior During Therapy: WFL for tasks  assessed/performed Overall Cognitive Status: Impaired/Different from baseline Area of Impairment: Problem solving;Memory;Following commands                 Orientation Level:  (NT)   Memory:  (?language vs memory; difficulty recalling what wants to say) Following Commands:  Follows one step commands consistently;Follows one step commands with increased time (following commands very slow, almost labored)     Problem Solving: Slow processing;Requires verbal cues General Comments: very pleasant, some sequencing difficulties noted      Exercises General Exercises - Lower Extremity Long Arc Quad: AAROM;Strengthening;Left;10 reps;Seated (assist for last 15* knee extension)    General Comments General comments (skin integrity, edema, etc.): vss      Pertinent Vitals/Pain Pain Assessment: Faces Faces Pain Scale: Hurts little more Pain Location: bilateral knees, "pinched nerve" in back wrapping around L anterior thigh Pain Descriptors / Indicators: Discomfort;Other (Comment);Burning (stiff) Pain Intervention(s): Limited activity within patient's tolerance;Monitored during session;Repositioned    Home Living                      Prior Function            PT Goals (current goals can now be found in the care plan section) Acute Rehab PT Goals Patient Stated Goal: get stronger PT Goal Formulation: With patient Time For Goal Achievement: 01/18/20 Potential to Achieve Goals: Good Progress towards PT goals: Progressing toward goals    Frequency    Min 4X/week      PT Plan Current plan remains appropriate    Co-evaluation              AM-PAC PT "6 Clicks" Mobility   Outcome Measure  Help needed turning from your back to your side while in a flat bed without using bedrails?: A Little Help needed moving from lying on your back to sitting on the side of a flat bed without using bedrails?: A Little Help needed moving to and from a bed to a chair (including a wheelchair)?: A Little Help needed standing up from a chair using your arms (Scott.g., wheelchair or bedside chair)?: A Little Help needed to walk in hospital room?: A Little Help needed climbing 3-5 steps with a railing? : Total 6 Click Score: 16    End of Session Equipment  Utilized During Treatment: Gait belt Activity Tolerance: Patient limited by fatigue Patient left: in chair;with call bell/phone within reach;with chair alarm set Nurse Communication: Mobility status PT Visit Diagnosis: Repeated falls (R29.6);Other abnormalities of gait and mobility (R26.89);Muscle weakness (generalized) (M62.81)     Time: 5176-1607 PT Time Calculation (min) (ACUTE ONLY): 31 min  Charges:  $Gait Training: 8-22 mins $Therapeutic Activity: 8-22 mins                    Kristopher Scott, PT Acute Rehabilitation Services Pager [phone]  Office [phone]  [place] Jaquaya Coyle 01/06/2020, 3:56 PM

## 2020-01-07 DIAGNOSIS — R079 Chest pain, unspecified: Secondary | ICD-10-CM

## 2020-01-07 DIAGNOSIS — I719 Aortic aneurysm of unspecified site, without rupture: Secondary | ICD-10-CM

## 2020-01-07 DIAGNOSIS — I712 Thoracic aortic aneurysm, without rupture, unspecified: Secondary | ICD-10-CM

## 2020-01-07 LAB — BASIC METABOLIC PANEL
Anion gap: 8 (ref 5–15)
BUN: 14 mg/dL (ref 8–23)
CO2: 22 mmol/L (ref 22–32)
Calcium: 9.2 mg/dL (ref 8.9–10.3)
Chloride: 103 mmol/L (ref 98–111)
Creatinine, Ser: 1.3 mg/dL — ABNORMAL HIGH (ref 0.61–1.24)
GFR calc Af Amer: >60 mL/min (ref >60)
GFR calc non Af Amer: 59 mL/min — ABNORMAL LOW (ref >60)
Glucose, Bld: 153 mg/dL — ABNORMAL HIGH (ref 70–99)
Potassium: 4 mmol/L (ref 3.5–5.1)
Sodium: 133 mmol/L — ABNORMAL LOW (ref 135–145)

## 2020-01-07 MED ORDER — EMPAGLIFLOZIN 10 MG PO TABS: 10.0000 mg | ORAL_TABLET | Freq: Every day | ORAL | 1 refills | Status: DC

## 2020-01-07 MED ORDER — SACUBITRIL-VALSARTAN 24-26 MG PO TABS: 1.0000 | ORAL_TABLET | Freq: Two times a day (BID) | ORAL | 0 refills | Status: DC

## 2020-01-07 MED ORDER — SACUBITRIL-VALSARTAN 24-26 MG PO TABS: 1.0000 | ORAL_TABLET | Freq: Two times a day (BID) | ORAL | 5 refills | Status: AC

## 2020-01-07 MED ORDER — FUROSEMIDE 20 MG PO TABS: 20.0000 mg | ORAL_TABLET | Freq: Every day | ORAL | 2 refills | Status: DC

## 2020-01-07 NOTE — Progress Notes (Signed)
Pt and wife updated that coreg evening dose has been now given and to hold any additional dose tonight per d/c instructions.

## 2020-01-07 NOTE — Discharge Summary (Signed)
Discharge Summary    Patient ID: Kristopher Scott MRN: [id number]; DOB: 1955-05-11  Admit date: 01/01/2020 Discharge date: 01/07/2020  Primary Care Provider: System, Pcp Not In  Primary Cardiologist: Kristopher Scott Primary Electrophysiologist:  None   Discharge Diagnoses    Principal Problem:   Acute on chronic systolic (congestive) heart failure [facility]) Active Problems:   Pacemaker   Sleep apnea   Hypercholesterolemia   Hypertensive disorder   Lumbar radiculopathy   Atypical chest pain   Thoracic aortic aneurysm [facility])    Diagnostic Studies/Procedures   CTA of chest 01/01/2020 IMPRESSION: 1. No acute aortic abnormality. 2. Stable ascending aortic aneurysm measuring 4.8 cm in transverse dimension. Ascending thoracic aortic aneurysm. Recommend semi-annual imaging followup by CTA or MRA and referral to cardiothoracic surgery if not already obtained. This recommendation follows 2010 ACCF/AHA/AATS/ACR/ASA/SCA/SCAI/SIR/STS/SVM Guidelines for the Diagnosis and Management of Patients With Thoracic Aortic Disease. Circulation. 2010; 121: O962-X528. Aortic aneurysm NOS (ICD10-I71.9) 3.  Aortic Atherosclerosis (ICD10-I70.0). 4. Minimal ground-glass opacities at both lung bases which could be due to atelectasis and/or early infectious etiology.    Echocardiogram 01/03/2020 1. Left ventricular ejection fraction, by estimation, is 40 to 45%. The  left ventricle has mildly decreased function. The left ventricle  demonstrates global hypokinesis with mild septal-lateral dyssynchrony.  There is moderate left ventricular  hypertrophy. Left ventricular diastolic parameters are consistent with  Grade I diastolic dysfunction (impaired relaxation).  2. Right ventricular systolic function is mildly reduced. The right  ventricular size is normal. There is normal pulmonary artery systolic  pressure. The estimated right ventricular systolic pressure is 20.8 mmHg.  3. The mitral valve is  normal in structure. Trivial mitral valve  regurgitation. No evidence of mitral stenosis.  4. The aortic valve is tricuspid. Aortic valve regurgitation is mild to  moderate. No aortic stenosis is present.  5. Aortic dilatation noted. There is moderate dilatation of the ascending  aorta measuring 48 mm. Would consider dedicated imaging by CTA or MRA to  further assess.  6. The inferior vena cava is normal in size with greater than 50%  respiratory variability, suggesting right atrial pressure of 3 mmHg.     Cerebral perfusion test 01/03/2020 IMPRESSION: 1. No evidence of proximal large vessel occlusion, significant stenosis, or abnormal perfusion. 2. Ascending aortic aneurysm is better evaluated on CTA chest from 01/01/2020.     CT of head 01/04/2020 FINDINGS: Brain: There is no mass, hemorrhage or extra-axial collection. The size and configuration of the ventricles and extra-axial CSF spaces are normal. The brain parenchyma is normal, without acute or chronic infarction.  Vascular: No abnormal hyperdensity of the major intracranial arteries or dural venous sinuses. No intracranial atherosclerosis.  Skull: The visualized skull base, calvarium and extracranial soft tissues are normal.  Sinuses/Orbits: No fluid levels or advanced mucosal thickening of the visualized paranasal sinuses. No mastoid or middle ear effusion. The orbits are normal.  IMPRESSION: Normal head CT.  _____________   History of Present Illness     Kristopher Scott is a 64 y.o. male with history of nonischemic cardiomyopathy with EF around 25%, paroxysmal atrial fibrillation on Eliquis for anticoagulation, hypertension, hyperlipidemia and diabetes mellitus brought to ER for evaluation of chest pain.  Patient has longstanding history of nonischemic cardiomyopathy.  Cardiac cath without evidence of CAD in 2016.  Unknown last echocardiogram.  He had [employer]onic BiV ICD placement in 2016.     He was  recently seen by electrophysiologist, Kristopher Elly Modena, MD on 12/05/19 for  diaphragmatic stimulation at multiple vectors in LV lead and high LV threshold.   He is scheduled for outpatient echocardiogram and lead revision later this week.  Kristopher Scott had substernal chest tightness with shortness of breath while walking with dog yesterday evening around 6 PM.  Chest pain worsened on his way back to home on incline.  It was radiating to his left shoulder and arm.  He was diaphoretic and nauseated but no vomiting.  For the Past, couple of months he does noted exertional shortness of breath but yesterday was worse episode.  He was given sublingual nitroglycerin x1 in route and diluted while in the ER without significant improvement.  Currently having 4 out of 10 chest tightness at center of his chest.  He denies palpitation or syncope.  Reports lower extremity edema, orthopnea and early satiety.  His chest tightness gets worse with deep breath and laying down.  Denies exposure to Covid.  No fever, chills or congestion.   Potassium 3.5 Serum creatinine 1.38 High-sensitivity troponin 6>>11>>18>> pending last reading Covid negative Urine clear CT angio of the chest abdominal pelvis without acute abnormality.  However it showed 4.8 cm ascending aortic aneurysm.  Patient report hx of CAD to his mother and two elder brother.  Hospital Course     Consultants: Dr Kristopher Scott of Neurology   Patient was admitted to cardiology service and felt to be mildly volume overloaded on arrival. Optivol level was increasing on his ICD.  He underwent IV diuresis.  His chest pain was felt to be atypical and it was tender on palpation.  It was suspected that he likely has musculoskeletal pain.  Initial CTA of the chest obtained on 01/01/2020 showed stable 4.8 cm ascending aortic aneurysm in the transverse dimension, aortic atherosclerosis, minimal groundglass opacity in both lung bases which could be due to  atelectasis or early infectious etiology.  In the morning of 01/03/2020, he had left-sided weakness concerning for stroke.  CT of the head showed no evidence of proximal large vessel occlusion in the brain.  Repeat head CT obtained on 01/04/2020 continue to be normal.  Patient was seen by Dr. Pearlean Scott of stroke team, there was consideration to switch to Pradaxa, however given lack of evidence of embolic event, utility of switching is low. Therefore it was recommended to discharge on Eliquis. Left-sided weakness was felt to be related to chronic pain and nerve injury. Echocardiogram obtained during this admission showed EF 40 to 45%, global hypokinesis with mild septal lateral dyssynchrony, moderate LVH, mild to moderate AI, moderate dilatation of the ascending aorta measuring at 48 mm.  Ejection fraction improved when compared to previous echocardiogram.  Patient was initially approved for CR, however he wished to go home instead.  Outpatient rehab has been set up for him.  Medication changes during this admission including discontinuation of losartan and a reduced dose of Lasix down to 20 mg daily.  He has been switched to Entresto 24-26 mg twice a day.  He has also been placed on Jardiance as well.  Patient will need basic metabolic panel in 1 week and [facility] as outpatient in 2 weeks. He has been given a 30 day free coupon for the [facility].  Did the patient have an acute coronary syndrome (MI, NSTEMI, STEMI, etc) this admission?:  No  Did the patient have a percutaneous coronary intervention (stent / angioplasty)?:  No.   _____________  Discharge Vitals Blood pressure 119/75, pulse 79, temperature 97.9 F (36.6 C), temperature source Oral, resp. rate 18, height 6\' 1"  (1.854 m), weight 116.1 kg, SpO2 99 %.  Filed Weights   01/05/20 0347 01/06/20 0500 01/07/20 0500  Weight: 112.6 kg 114 kg 116.1 kg    Labs & Radiologic Studies    CBC No  results for input(s): WBC, NEUTROABS, HGB, HCT, MCV, PLT in the last 72 hours. Basic Metabolic Panel Recent Labs    40/98/11 1216 01/07/20 0938  NA 136 133*  K 4.1 4.0  CL 104 103  CO2 22 22  GLUCOSE 152* 153*  BUN 15 14  CREATININE 1.31* 1.30*  CALCIUM 9.6 9.2   Liver Function Tests No results for input(s): AST, ALT, ALKPHOS, BILITOT, PROT, ALBUMIN in the last 72 hours. No results for input(s): LIPASE, AMYLASE in the last 72 hours. High Sensitivity Troponin:   Recent Labs  Lab 01/01/20 1918 01/02/20 0229 01/02/20 0830 01/02/20 1124 01/03/20 1202  TROPONINIHS 6 11 18* 18* 13    BNP Invalid input(s): POCBNP D-Dimer No results for input(s): DDIMER in the last 72 hours. Hemoglobin A1C No results for input(s): HGBA1C in the last 72 hours. Fasting Lipid Panel No results for input(s): CHOL, HDL, LDLCALC, TRIG, CHOLHDL, LDLDIRECT in the last 72 hours. Thyroid Function Tests No results for input(s): TSH, T4TOTAL, T3FREE, THYROIDAB in the last 72 hours.  Invalid input(s): FREET3 _____________  DG Chest 2 View  Result Date: 01/01/2020 CLINICAL DATA:  SOB, chest pain EXAM: CHEST - 2 VIEW COMPARISON:  Chest radiograph 09/10/2017 FINDINGS: Stable cardiomediastinal contours left chest pacer in place. Low lung volumes. Bibasilar opacities favored to represent atelectasis. No pneumothorax or large pleural effusion. No acute finding in the visualized skeleton. IMPRESSION: Low lung volumes with bibasilar opacities favored to represent atelectasis. Electronically Signed   By: Emmaline Kluver M.D.   On: 01/01/2020 19:50   DG Lumbar Spine 2-3 Views  Result Date: 01/06/2020 CLINICAL DATA:  Low back and left leg pain. EXAM: LUMBAR SPINE - 2-3 VIEW COMPARISON:  April 07, 2016. FINDINGS: No fracture spondylolisthesis is noted. Mild degenerative disc disease is noted at L2-3, L3-4, L4-5 and L5-S1. IMPRESSION: Mild multilevel degenerative disc disease. Electronically Signed   By: Lupita Raider M.D.   On: 01/06/2020 11:33   CT HEAD WO CONTRAST  Result Date: 01/05/2020 CLINICAL DATA:  Stroke follow-up EXAM: CT HEAD WITHOUT CONTRAST TECHNIQUE: Contiguous axial images were obtained from the base of the skull through the vertex without intravenous contrast. COMPARISON:  01/04/2020 FINDINGS: Brain: There is no mass, hemorrhage or extra-axial collection. The size and configuration of the ventricles and extra-axial CSF spaces are normal. The brain parenchyma is normal, without acute or chronic infarction. Vascular: No abnormal hyperdensity of the major intracranial arteries or dural venous sinuses. No intracranial atherosclerosis. Skull: The visualized skull base, calvarium and extracranial soft tissues are normal. Sinuses/Orbits: No fluid levels or advanced mucosal thickening of the visualized paranasal sinuses. No mastoid or middle ear effusion. The orbits are normal. IMPRESSION: Normal head CT. Electronically Signed   By: Deatra Robinson M.D.   On: 01/05/2020 00:03   CT HEAD WO CONTRAST  Result Date: 01/04/2020 CLINICAL DATA:  Stroke, follow-up. Additional provided: Patient unable to have MRI. EXAM: CT HEAD WITHOUT CONTRAST TECHNIQUE: Contiguous axial images were obtained from the base of the skull through the vertex without  intravenous contrast. COMPARISON:  Noncontrast head CT, CT angiogram head/neck and CT perfusion 01/03/2020 FINDINGS: Brain: Cerebral volume is normal There is no acute intracranial hemorrhage. No demarcated cortical infarct is identified. No extra-axial fluid collection. No evidence of intracranial mass. No midline shift. Vascular: No hyperdense vessel.  Atherosclerotic calcifications Skull: Normal. Negative for fracture or focal lesion. Sinuses/Orbits: Visualized orbits show no acute finding. Mild scattered ethmoid sinus mucosal thickening. No significant mastoid effusion. IMPRESSION: No CT evidence of acute intracranial abnormality. Mild ethmoid sinus mucosal thickening.  Electronically Signed   By: Jackey Loge DO   On: 01/04/2020 13:38   CT CEREBRAL PERFUSION W CONTRAST  Result Date: 01/03/2020 CLINICAL DATA:  Neuro deficit, acute stroke suspected. EXAM: CT ANGIOGRAPHY HEAD AND NECK CT PERFUSION BRAIN TECHNIQUE: Multidetector CT imaging of the head and neck was performed using the standard protocol during bolus administration of intravenous contrast. Multiplanar CT image reconstructions and MIPs were obtained to evaluate the vascular anatomy. Carotid stenosis measurements (when applicable) are obtained utilizing NASCET criteria, using the distal internal carotid diameter as the denominator. Multiphase CT imaging of the brain was performed following IV bolus contrast injection. Subsequent parametric perfusion maps were calculated using RAPID software. CONTRAST:  OMNIPAQUE IOHEXOL 350 MG/ML SOLN COMPARISON:  Same day CT head.  CT chest from 01/01/2020. FINDINGS: CTA NECK FINDINGS Aortic arch: Ascending aortic aneurysm is better evaluated on CTA chest from 01/01/2020. Right carotid system: No evidence of dissection, stenosis (50% or greater) or occlusion. Left carotid system: No evidence of dissection, stenosis (50% or greater) or occlusion. Vertebral arteries: Left dominant. No evidence of dissection, stenosis (50% or greater) or occlusion. Skeleton: No acute abnormality. Other neck: Negative Upper chest: Clear. Review of the MIP images confirms the above findings CTA HEAD FINDINGS Anterior circulation: No significant stenosis, proximal occlusion, aneurysm, or vascular malformation. Posterior circulation: No significant stenosis, proximal occlusion, aneurysm, or vascular malformation. Venous sinuses: As permitted by contrast timing, patent. The left transverse sinus is non dominant/diminutive with arachnoid granulation in the distal transverse sinus. Anatomic variants: The ACA is partially azygos proximally. Review of the MIP images confirms the above findings CT Brain  Perfusion Findings: ASPECTS: 10 CBF (<30%) Volume: 0mL Perfusion (Tmax>6.0s) volume: 0mL Mismatch Volume: 0mL Infarction Location:None IMPRESSION: 1. No evidence of proximal large vessel occlusion, significant stenosis, or abnormal perfusion. 2. Ascending aortic aneurysm is better evaluated on CTA chest from 01/01/2020. Code stroke imaging results were communicated on 01/03/2020 at 10:50 am to provider Dr. Marchelle Folks via telephone, who verbally acknowledged these results. Electronically Signed   By: Feliberto Harts MD   On: 01/03/2020 11:08   ECHOCARDIOGRAM COMPLETE  Result Date: 01/03/2020    ECHOCARDIOGRAM REPORT   Patient Name:   SADAO WEYER Date of Exam: 01/03/2020 Medical Rec #:  [id number]         Height:       73.0 in Accession #:    [id number]        Weight:       248.8 lb Date of Birth:  Sep 11, 1955         BSA:          2.361 m Patient Age:    63 years          BP:           130/85 mmHg Patient Gender: M                 HR:  58 bpm. Exam Location:  Inpatient Procedure: 2D Echo, Cardiac Doppler and Color Doppler Indications:    Cardiomyopathy.  History:        Patient has no prior history of Echocardiogram examinations.                 CHF, Pacemaker, Signs/Symptoms:Chest Pain; Risk                 Factors:Hypertension, Dyslipidemia and Sleep Apnea.  Sonographer:    Sheralyn Boatman RDCS Referring Phys: [id number] [facility]  Sonographer Comments: Technically difficult study due to poor echo windows. IMPRESSIONS  1. Left ventricular ejection fraction, by estimation, is 40 to 45%. The left ventricle has mildly decreased function. The left ventricle demonstrates global hypokinesis with mild septal-lateral dyssynchrony. There is moderate left ventricular hypertrophy. Left ventricular diastolic parameters are consistent with Grade I diastolic dysfunction (impaired relaxation).  2. Right ventricular systolic function is mildly reduced. The right ventricular size is normal. There is normal  pulmonary artery systolic pressure. The estimated right ventricular systolic pressure is 20.8 mmHg.  3. The mitral valve is normal in structure. Trivial mitral valve regurgitation. No evidence of mitral stenosis.  4. The aortic valve is tricuspid. Aortic valve regurgitation is mild to moderate. No aortic stenosis is present.  5. Aortic dilatation noted. There is moderate dilatation of the ascending aorta measuring 48 mm. Would consider dedicated imaging by CTA or MRA to further assess.  6. The inferior vena cava is normal in size with greater than 50% respiratory variability, suggesting right atrial pressure of 3 mmHg. FINDINGS  Left Ventricle: Left ventricular ejection fraction, by estimation, is 40 to 45%. The left ventricle has mildly decreased function. The left ventricle demonstrates global hypokinesis. The left ventricular internal cavity size was normal in size. There is  moderate left ventricular hypertrophy. Left ventricular diastolic parameters are consistent with Grade I diastolic dysfunction (impaired relaxation). Right Ventricle: The right ventricular size is normal. No increase in right ventricular wall thickness. Right ventricular systolic function is mildly reduced. There is normal pulmonary artery systolic pressure. The tricuspid regurgitant velocity is 2.11 m/s, and with an assumed right atrial pressure of 3 mmHg, the estimated right ventricular systolic pressure is 20.8 mmHg. Left Atrium: Left atrial size was normal in size. Right Atrium: Right atrial size was normal in size. Pericardium: There is no evidence of pericardial effusion. Mitral Valve: The mitral valve is normal in structure. Trivial mitral valve regurgitation. No evidence of mitral valve stenosis. Tricuspid Valve: The tricuspid valve is normal in structure. Tricuspid valve regurgitation is trivial. Aortic Valve: The aortic valve is tricuspid. Aortic valve regurgitation is mild to moderate. Aortic regurgitation PHT measures 678 msec.  No aortic stenosis is present. Pulmonic Valve: The pulmonic valve was normal in structure. Pulmonic valve regurgitation is not visualized. Aorta: Aortic dilatation noted. There is moderate dilatation of the ascending aorta measuring 48 mm. Venous: The inferior vena cava is normal in size with greater than 50% respiratory variability, suggesting right atrial pressure of 3 mmHg. IAS/Shunts: No atrial level shunt detected by color flow Doppler. Additional Comments: A pacer wire is visualized in the right ventricle.  LEFT VENTRICLE PLAX 2D LVIDd:         5.00 cm      Diastology LVIDs:         3.85 cm      LV e' lateral:   9.00 cm/s LV PW:         1.40 cm  LV E/e' lateral: 6.3 LV IVS:        1.50 cm      LV e' medial:    5.84 cm/s LVOT diam:     2.40 cm      LV E/e' medial:  9.7 LVOT Area:     4.52 cm  LV Volumes (MOD) LV vol d, MOD A2C: 145.0 ml LV vol d, MOD A4C: 164.5 ml LV vol s, MOD A2C: 67.1 ml LV vol s, MOD A4C: 79.8 ml LV SV MOD A2C:     77.9 ml LV SV MOD A4C:     164.5 ml LV SV MOD BP:      82.4 ml RIGHT VENTRICLE            IVC RV S prime:     5.68 cm/s  IVC diam: 1.70 cm TAPSE (M-mode): 1.6 cm LEFT ATRIUM             Index       RIGHT ATRIUM           Index LA diam:        3.70 cm 1.57 cm/m  RA Area:     16.90 cm LA Vol (A2C):   51.9 ml 21.98 ml/m RA Volume:   43.20 ml  18.30 ml/m LA Vol (A4C):   29.5 ml 12.50 ml/m LA Biplane Vol: 41.4 ml 17.54 ml/m  AORTIC VALVE AI PHT:      678 msec  AORTA Ao Root diam: 4.70 cm Ao Asc diam:  4.75 cm MITRAL VALVE               TRICUSPID VALVE MV Area (PHT): 1.66 cm    TR Peak grad:   17.8 mmHg MV Decel Time: 458 msec    TR Vmax:        211.00 cm/s MV E velocity: 56.60 cm/s MV A velocity: 81.00 cm/s  SHUNTS MV E/A ratio:  0.70        Systemic Diam: 2.40 cm Marca Ancona MD Electronically signed by Marca Ancona MD Signature Date/Time: 01/03/2020/12:27:41 PM    Final    CT Angio Chest/Abd/Pel for Dissection W and/or Wo Contrast  Result Date: 01/01/2020 CLINICAL  DATA:  Chest pain and back pain EXAM: CT ANGIOGRAPHY CHEST, ABDOMEN AND PELVIS TECHNIQUE: Non-contrast CT of the chest was initially obtained. Multidetector CT imaging through the chest, abdomen and pelvis was performed using the standard protocol during bolus administration of intravenous contrast. Multiplanar reconstructed images and MIPs were obtained and reviewed to evaluate the vascular anatomy. CONTRAST:  OMNIPAQUE IOHEXOL 350 MG/ML SOLN COMPARISON:  None. FINDINGS: CTA CHEST FINDINGS Cardiovascular: --Heart: The heart size is mildly enlarged. There is nopericardial effusion. --Aorta: There is unchanged mild aneurysmal dilatation of the ascending aorta measuring up to 4.8 cm in maximum dimension which tapers at the level of the aortic arch. There is scattered mild noncalcified aortic atherosclerotic calcification. Precontrast images show no aortic intramural hematoma. There is no blood pool, dissection or penetrating ulcer demonstrated on arterial phase postcontrast imaging. There is a conventional 3 vessel aortic arch branching pattern. The proximal arch vessels are widely patent. A left-sided ICD seen with the lead tips at the right ventricle and right atrium. --Pulmonary Arteries: Contrast timing is optimized for preferential opacification of the aorta. Within that limitation, normal central pulmonary arteries. Mediastinum/Nodes: No mediastinal, hilar or axillary lymphadenopathy. The visualized thyroid and thoracic esophageal course are unremarkable. Lungs/Pleura: Mild hazy ground-glass opacities are seen at both lung bases.  No pleural effusion or pneumothorax. No focal airspace consolidation. No focal pleural abnormality. Musculoskeletal: No chest wall abnormality. No acute osseous findings. Review of the MIP images confirms the above findings. CTA ABDOMEN AND PELVIS FINDINGS VASCULAR Aorta: Normal caliber aorta without aneurysm, dissection, vasculitis or hemodynamically significant stenosis. There  is scattered aortic atherosclerosis. Celiac: No aneurysm, dissection or hemodynamically significant stenosis. Normal branching pattern SMA: Widely patent without dissection or stenosis. Renals: Single renal arteries bilaterally. No aneurysm, dissection, stenosis or evidence of fibromuscular dysplasia. IMA: Patent without abnormality. Inflow: No aneurysm, stenosis or dissection. Veins: Normal course and caliber of the major veins. Assessment is otherwise limited by the arterial dominant contrast phase. Review of the MIP images confirms the above findings. NON-VASCULAR Hepatobiliary: Normal hepatic contours and density. No visible biliary dilatation. Normal gallbladder. Pancreas: Normal contours without ductal dilatation. No peripancreatic fluid collection. Spleen: Normal arterial phase splenic enhancement pattern. Adrenals/Urinary Tract: --Adrenal glands: Normal. Kidneys: Multiple bilateral low-density lesions are seen within both kidneys the largest measuring 7 cm in the upper pole of the right kidney. No hydronephrosis.--Urinary bladder: Unremarkable. Stomach/Bowel: --Stomach/Duodenum: No hiatal hernia or other gastric abnormality. Normal duodenal course and caliber. --Small bowel: No dilatation or inflammation. --Colon: No focal abnormality. --Appendix: Normal. Lymphatic:  No abdominal or pelvic lymphadenopathy. Reproductive: No free fluid in the pelvis. Musculoskeletal. No bony spinal canal stenosis or focal osseous abnormality. Other: None. Review of the MIP images confirms the above findings. IMPRESSION: 1. No acute aortic abnormality. 2. Stable ascending aortic aneurysm measuring 4.8 cm in transverse dimension. Ascending thoracic aortic aneurysm. Recommend semi-annual imaging followup by CTA or MRA and referral to cardiothoracic surgery if not already obtained. This recommendation follows 2010 ACCF/AHA/AATS/ACR/ASA/SCA/SCAI/SIR/STS/SVM Guidelines for the Diagnosis and Management of Patients With Thoracic  Aortic Disease. Circulation. 2010; 121: B510-C585. Aortic aneurysm NOS (ICD10-I71.9) 3.  Aortic Atherosclerosis (ICD10-I70.0). 4. Minimal ground-glass opacities at both lung bases which could be due to atelectasis and/or early infectious etiology. Electronically Signed   By: Jonna Clark M.D.   On: 01/01/2020 21:37   CT HEAD CODE STROKE WO CONTRAST  Result Date: 01/03/2020 CLINICAL DATA:  Code stroke.  Neuro deficit. EXAM: CT HEAD WITHOUT CONTRAST TECHNIQUE: Contiguous axial images were obtained from the base of the skull through the vertex without intravenous contrast. COMPARISON:  CT head 01/25/2013 FINDINGS: Brain: No evidence of acute infarction, hemorrhage, hydrocephalus, extra-axial collection or mass lesion/mass effect. Vascular: No hyperdense vessel or unexpected calcification. Calcific intracranial atherosclerosis. Skull: Normal. Negative for fracture or focal lesion. Sinuses/Orbits: No acute finding. Other: No mastoid effusions. ASPECTS (Alberta Stroke Program Early CT Score):10 IMPRESSION: 1. WNo acute hemorrhage or evidence of large vascular territory infarct. 2. ASPECTS is 10 Code stroke imaging results were communicated on 01/03/2020 at 10:38 am to provider Dr. Derry Lory Via telephone, who verbally acknowledged these results. Electronically Signed   By: Feliberto Harts MD   On: 01/03/2020 10:42   CT ANGIO HEAD CODE STROKE  Result Date: 01/03/2020 CLINICAL DATA:  Neuro deficit, acute stroke suspected. EXAM: CT ANGIOGRAPHY HEAD AND NECK CT PERFUSION BRAIN TECHNIQUE: Multidetector CT imaging of the head and neck was performed using the standard protocol during bolus administration of intravenous contrast. Multiplanar CT image reconstructions and MIPs were obtained to evaluate the vascular anatomy. Carotid stenosis measurements (when applicable) are obtained utilizing NASCET criteria, using the distal internal carotid diameter as the denominator. Multiphase CT imaging of the brain was performed  following IV bolus contrast injection. Subsequent parametric perfusion maps were calculated using RAPID  software. CONTRAST:  OMNIPAQUE IOHEXOL 350 MG/ML SOLN COMPARISON:  Same day CT head.  CT chest from 01/01/2020. FINDINGS: CTA NECK FINDINGS Aortic arch: Ascending aortic aneurysm is better evaluated on CTA chest from 01/01/2020. Right carotid system: No evidence of dissection, stenosis (50% or greater) or occlusion. Left carotid system: No evidence of dissection, stenosis (50% or greater) or occlusion. Vertebral arteries: Left dominant. No evidence of dissection, stenosis (50% or greater) or occlusion. Skeleton: No acute abnormality. Other neck: Negative Upper chest: Clear. Review of the MIP images confirms the above findings CTA HEAD FINDINGS Anterior circulation: No significant stenosis, proximal occlusion, aneurysm, or vascular malformation. Posterior circulation: No significant stenosis, proximal occlusion, aneurysm, or vascular malformation. Venous sinuses: As permitted by contrast timing, patent. The left transverse sinus is non dominant/diminutive with arachnoid granulation in the distal transverse sinus. Anatomic variants: The ACA is partially azygos proximally. Review of the MIP images confirms the above findings CT Brain Perfusion Findings: ASPECTS: 10 CBF (<30%) Volume: 16mL Perfusion (Tmax>6.0s) volume: 25mL Mismatch Volume: 37mL Infarction Location:None IMPRESSION: 1. No evidence of proximal large vessel occlusion, significant stenosis, or abnormal perfusion. 2. Ascending aortic aneurysm is better evaluated on CTA chest from 01/01/2020. Code stroke imaging results were communicated on 01/03/2020 at 10:50 am to provider Dr. Marchelle Folks via telephone, who verbally acknowledged these results. Electronically Signed   By: Feliberto Harts MD   On: 01/03/2020 11:08   CT ANGIO NECK CODE STROKE  Result Date: 01/03/2020 CLINICAL DATA:  Neuro deficit, acute stroke suspected. EXAM: CT ANGIOGRAPHY HEAD  AND NECK CT PERFUSION BRAIN TECHNIQUE: Multidetector CT imaging of the head and neck was performed using the standard protocol during bolus administration of intravenous contrast. Multiplanar CT image reconstructions and MIPs were obtained to evaluate the vascular anatomy. Carotid stenosis measurements (when applicable) are obtained utilizing NASCET criteria, using the distal internal carotid diameter as the denominator. Multiphase CT imaging of the brain was performed following IV bolus contrast injection. Subsequent parametric perfusion maps were calculated using RAPID software. CONTRAST:  OMNIPAQUE IOHEXOL 350 MG/ML SOLN COMPARISON:  Same day CT head.  CT chest from 01/01/2020. FINDINGS: CTA NECK FINDINGS Aortic arch: Ascending aortic aneurysm is better evaluated on CTA chest from 01/01/2020. Right carotid system: No evidence of dissection, stenosis (50% or greater) or occlusion. Left carotid system: No evidence of dissection, stenosis (50% or greater) or occlusion. Vertebral arteries: Left dominant. No evidence of dissection, stenosis (50% or greater) or occlusion. Skeleton: No acute abnormality. Other neck: Negative Upper chest: Clear. Review of the MIP images confirms the above findings CTA HEAD FINDINGS Anterior circulation: No significant stenosis, proximal occlusion, aneurysm, or vascular malformation. Posterior circulation: No significant stenosis, proximal occlusion, aneurysm, or vascular malformation. Venous sinuses: As permitted by contrast timing, patent. The left transverse sinus is non dominant/diminutive with arachnoid granulation in the distal transverse sinus. Anatomic variants: The ACA is partially azygos proximally. Review of the MIP images confirms the above findings CT Brain Perfusion Findings: ASPECTS: 10 CBF (<30%) Volume: 38mL Perfusion (Tmax>6.0s) volume: 57mL Mismatch Volume: 38mL Infarction Location:None IMPRESSION: 1. No evidence of proximal large vessel occlusion, significant  stenosis, or abnormal perfusion. 2. Ascending aortic aneurysm is better evaluated on CTA chest from 01/01/2020. Code stroke imaging results were communicated on 01/03/2020 at 10:50 am to provider Dr. Marchelle Folks via telephone, who verbally acknowledged these results. Electronically Signed   By: Feliberto Harts MD   On: 01/03/2020 11:08   US Abdomen Limited RUQ  Result Date: 01/03/2020  CLINICAL DATA:  Generalized abdominal pain over the last 10 years. EXAM: ULTRASOUND ABDOMEN LIMITED RIGHT UPPER QUADRANT COMPARISON:  CT 01/01/2020 FINDINGS: Gallbladder: No gallstones or wall thickening visualized. No sonographic Murphy sign noted by sonographer. Common bile duct: Diameter: 3.8 mm, normal Liver: No focal lesion identified. Within normal limits in parenchymal echogenicity. Portal vein is patent on color Doppler imaging with normal direction of blood flow towards the liver. Other: Very small right pleural effusion is noted incidentally. Multiple right renal cysts, largest measuring almost 8 cm. IMPRESSION: 1. No abnormality seen of the gallbladder or liver. 2. Very small right pleural effusion. 3. Multiple right renal cysts, largest measuring almost 8 cm. Electronically Signed   By: Paulina Fusi M.D.   On: 01/03/2020 16:33   Disposition   Pt is being discharged home today in good condition.  Follow-up Plans & Appointments     Follow-up Information    [facility]   Specialty: Rehabilitation Why: The outpatient therapy will contact you for the first appointment. Contact information: [address] [id number] [place] [address] [phone]       [facility] Schedule an appointment as soon as possible for a visit.   Why: Please make an appointment to see your PCP and cardiologist at [facility] in 2 weeks. You will also need a outpatient basic metabolic panel (BMET) lab in 1 week after discharge to check your renal  function and electrolyte on the new medications.              Discharge Instructions    Ambulatory referral to Occupational Therapy   Complete by: As directed    Ambulatory referral to Physical Therapy   Complete by: As directed    Call MD for:  difficulty breathing, headache or visual disturbances   Complete by: As directed    Diet - low sodium heart healthy   Complete by: As directed    Increase activity slowly   Complete by: As directed       Discharge Medications   Allergies as of 01/07/2020      Reactions   Spironolactone Other (See Comments)   Lisinopril Cough   Propranolol Itching      Medication List    STOP taking these medications   losartan 25 MG tablet Commonly known as: COZAAR     TAKE these medications   allopurinol 100 MG tablet Commonly known as: ZYLOPRIM Take 100 mg by mouth daily.   atorvastatin 80 MG tablet Commonly known as: LIPITOR Take 40 mg by mouth daily.   carvedilol 6.25 MG tablet Commonly known as: COREG Take 3.125 mg by mouth 2 (two) times daily with a meal.   clonazePAM 2 MG tablet Commonly known as: KLONOPIN Take 2 mg by mouth 2 (two) times daily as needed for anxiety (sleep).   ELIQUIS PO Take 5 mg by mouth 2 (two) times daily.   empagliflozin 10 MG Tabs tablet Commonly known as: JARDIANCE Take 1 tablet (10 mg total) by mouth daily. Start taking on: January 08, 2020   erythromycin with ethanol 2 % gel Commonly known as: Erygel Apply topically daily. To affected area in between toes of left foot   furosemide 20 MG tablet Commonly known as: LASIX Take 1 tablet (20 mg total) by mouth daily. Start taking on: January 08, 2020 What changed:   medication strength  how much to take   HYDROcodone-acetaminophen 5-325 MG tablet Commonly known as: NORCO/VICODIN Take 1  tablet by mouth every 6 (six) hours as needed for moderate pain.   isosorbide mononitrate 60 MG 24 hr tablet Commonly known as: IMDUR Take 60 mg by  mouth daily.   ketoconazole 2 % cream Commonly known as: NIZORAL Apply 1 fingertip amount to bottom of each foot daily.   naproxen 375 MG tablet Commonly known as: NAPROSYN Take 1 tablet (375 mg total) by mouth 2 (two) times daily. What changed:   when to take this  reasons to take this   nitroGLYCERIN 0.4 MG SL tablet Commonly known as: NITROSTAT Place 0.4 mg under the tongue every 5 (five) minutes as needed for chest pain.   prazosin 5 MG capsule Commonly known as: MINIPRESS Take 5 mg by mouth at bedtime.   pregabalin 75 MG capsule Commonly known as: LYRICA Take 75 mg by mouth daily.   sacubitril-valsartan 24-26 MG Commonly known as: ENTRESTO Take 1 tablet by mouth 2 (two) times daily.          Outstanding Labs/Studies   Obtain BMET in 1 week  Duration of Discharge Encounter   Greater than 30 minutes including physician time.  Ramond Dial, PA 01/07/2020, 1:37 PM

## 2020-01-07 NOTE — Progress Notes (Signed)
Pt and wife were educated on discharge instructions from [facility] as well as cardiology. Wife stated it will be at least a couple of hours before able to pick up patient.

## 2020-01-07 NOTE — Progress Notes (Signed)
Pt stated he tried to call about his prescription to his pharmacy and was told that his medication (entresto) needed to be called in by a doctor.

## 2020-01-07 NOTE — Progress Notes (Signed)
Progress Note  Patient Name: Kristopher Scott Date of Encounter: 01/07/2020  [facility] Cardiologist: No primary care provider on file.   Subjective   I/Os not measured.   Wt increased (114 >116 kg).  Stable renal function (1.36 > 1.31 > 1.30)  BP stable 110/65.  Reports dyspnea improved.  Inpatient Medications    Scheduled Meds: . allopurinol  100 mg Oral Daily  . apixaban  5 mg Oral BID  . atorvastatin  80 mg Oral Daily  . carvedilol  3.125 mg Oral BID WC  . empagliflozin  10 mg Oral Daily  . furosemide  20 mg Oral Daily  . isosorbide mononitrate  60 mg Oral Daily  . lidocaine  1 patch Transdermal Q24H  . prazosin  5 mg Oral QHS  . pregabalin  75 mg Oral Daily  . sacubitril-valsartan  1 tablet Oral BID   Continuous Infusions:  PRN Meds: acetaminophen, alum & mag hydroxide-simeth, HYDROcodone-acetaminophen, nitroGLYCERIN, ondansetron (ZOFRAN) IV   Vital Signs    Vitals:   01/06/20 2355 01/07/20 0327 01/07/20 0500 01/07/20 0917  BP: 101/65 107/72  110/65  Pulse: 67 67  68  Resp: 18 18  18   Temp: 97.7 F (36.5 C) 97.8 F (36.6 C)  (!) 97.5 F (36.4 C)  TempSrc: Oral Oral  Oral  SpO2: 99% 100%  100%  Weight:   116.1 kg   Height:       No intake or output data in the 24 hours ending 01/07/20 1048 Last 3 Weights 01/07/2020 01/06/2020 01/05/2020  Weight (lbs) 255 lb 15.3 oz 251 lb 5.2 oz 248 lb 3.8 oz  Weight (kg) 116.1 kg 114 kg 112.6 kg      Telemetry    Sinus rhythm.  VP - Personally Reviewed  ECG    A sensed V paced.  Rate 67 bpm. - Personally Reviewed  Physical Exam   VS:  BP 110/65 (BP Location: Left Arm)   Pulse 68   Temp (!) 97.5 F (36.4 C) (Oral)   Resp 18   Ht 6\' 1"  (1.854 m)   Wt 116.1 kg   SpO2 100%   BMI 33.77 kg/m  , BMI Body mass index is 33.77 kg/m. GENERAL:  Well-appearing HEENT: Pupils equal round and reactive, fundi not visualized, oral mucosa unremarkable NECK:  No jugular venous distention, waveform within normal limits,  carotid upstroke brisk and symmetric, no bruits LUNGS:  Clear to auscultation bilaterally HEART:  RRR.  PMI not displaced or sustained,S1 and S2 within normal limits, no S3, no S4, no clicks, no rubs, II/VI systolic murmur ABD:  Flat, positive bowel sounds normal in frequency in pitch, no bruits, no rebound, no guarding, no midline pulsatile mass, no hepatomegaly, no splenomegaly EXT:  2 plus pulses throughout, no edema, no cyanosis no clubbing SKIN:  No rashes no nodules NEURO: L sided weakness PSYCH:  Cognitively intact, oriented to person place and time  Labs    High Sensitivity Troponin:   Recent Labs  Lab 01/01/20 1918 01/02/20 0229 01/02/20 0830 01/02/20 1124 01/03/20 1202  TROPONINIHS 6 11 18* 18* 13      Chemistry Recent Labs  Lab 01/02/20 1124 01/03/20 0624 01/03/20 1202 01/04/20 0727 01/05/20 0345 01/06/20 1216 01/07/20 0938  NA  --    < >  --    < > 137 136 133*  K  --    < >  --    < > 3.5 4.1 4.0  CL  --    < >  --    < >  103 104 103  CO2  --    < >  --    < > 25 22 22   GLUCOSE  --    < >  --    < > 150* 152* 153*  BUN  --    < >  --    < > 15 15 14   CREATININE  --    < >  --    < > 1.36* 1.31* 1.30*  CALCIUM  --    < >  --    < > 9.0 9.6 9.2  PROT 6.3*  --  6.7  --   --   --   --   ALBUMIN 3.4*  --  3.5  --   --   --   --   AST 21  --  22  --   --   --   --   ALT 20  --  23  --   --   --   --   ALKPHOS 53  --  60  --   --   --   --   BILITOT 1.0  --  0.8  --   --   --   --   GFRNONAA  --    < >  --    < > 56* 58* 59*  GFRAA  --    < >  --    < > >60 >60 >60  ANIONGAP  --    < >  --    < > 9 10 8    < > = values in this interval not displayed.     Hematology Recent Labs  Lab 01/01/20 1918 01/03/20 0624  WBC 4.7 3.5*  RBC 4.91 4.99  HGB 14.1 14.1  HCT 42.0 43.2  MCV 85.5 86.6  MCH 28.7 28.3  MCHC 33.6 32.6  RDW 15.6* 15.8*  PLT 187 182    BNP Recent Labs  Lab 01/02/20 2025  BNP 137.9*     DDimer No results for input(s): DDIMER in  the last 168 hours.   Radiology    DG Lumbar Spine 2-3 Views  Result Date: 01/06/2020 CLINICAL DATA:  Low back and left leg pain. EXAM: LUMBAR SPINE - 2-3 VIEW COMPARISON:  April 07, 2016. FINDINGS: No fracture spondylolisthesis is noted. Mild degenerative disc disease is noted at L2-3, L3-4, L4-5 and L5-S1. IMPRESSION: Mild multilevel degenerative disc disease. Electronically Signed   By: 01/04/20 M.D.   On: 01/06/2020 11:33    Cardiac Studies   Echo pending  Patient Profile     Kristopher Scott is a 70M with chronic systolic and diastolic heart failure (NICM), paroxysmal atrial fibrillation, s/p ICD, hypertension, hyperlipidemia and diabetes admitted with chest pain.   Assessment & Plan    # L sided weakness:  Head CT without acute findings x2.  Likely 2/2 chronic pain pain/nerve injury.  Going to CIR.  # NICM: # chronic systolic and diastolic heart failure: # s/p CRT-D:  Kristopher Scott was mildly volume overloaded on initial presentation.  His OptiVol levels were increasing on his ICD.  Stable on oral lasix.  Dose was reduced 2/2 starting Entresto and Jardiance.  BNP was only mildly elevated to 138.  Continue carvedilol and Imdur.  Scheduled to have a lead revision at [facility].  Will need to reschedule after CIR.  Device thresholds were stable on this admission.  LVEF this admission increased to 40-45% from <30% previously.  He is  euvolemic.  -Discharge meds coreg 3.125 mg BID, jardiance 10 mg daily, lasix 20 mg daily, imdur 60 mg daily, entresto 24-26 mg BID  # Atypical chest pain: His chest pain is very atypical and he is quite tender to palpation on exam. .  High-sensitivity troponin is minimally elevated to 18 and flat.  Unable to interpret his EKG due to ventricular pacing.  He has a history of nonischemic cardiomyopathy with minimal plaque verified on his cath from 2015.  No plan for an ischemia evaluation.  Symptoms have improved with lidocaine patch. He is no longer getting IV  pain medication.   # Essential hypertension:  Blood pressure well-controlled on carvedilol and Imdur.  Losartan was switched to [facility].   # PAF:  Currently in sinus rhythm.  Continue carvedilol and Eliquis. There was consideration of switching to Pradaxa.  Agree that given lack of evidence of an embolic event the utility of switching is low.  Continue Eliquis.   # Hyperlipidemia: Continue statin.  LDL 99.  Atorvastatin was increased to 80mg .  Repeat lipids/CMP in 2-3 months.    # Dispo: Stable for discharge.  Had planned CIR but patient concerned about cost and would like to discharge home with outpatient therapy.  Will plan for discharge today.  Need BMET within 1 week and [facility]  For questions or updates, please contact [facility] Please consult [email or web address] for contact info under        Signed, , MD  01/07/2020, 10:48 AM

## 2020-01-12 ENCOUNTER — Other Ambulatory Visit: Payer: Self-pay | Admitting: *Deleted

## 2020-01-12 NOTE — Patient Outreach (Signed)
[facility]) Care Management  01/12/2020  Kristopher Scott November 24, 1955 [id number]   RED ON EMMI ALERT - Stroke Day # 1 Date: 9/7 Red Alert Reason: Not filled all prescriptions, Unable to take every dose of medications, and problems setting up rehab   Outreach attempt #1, unsuccessful, HIPAA compliant voice message left.   Plan: RN CM will send unsuccessful outreach letter and follow up within the next 3-4 business days.  Kristopher Scott, [place], MSN [facility] Care Management  [facility] Manager [phone]

## 2020-01-17 ENCOUNTER — Other Ambulatory Visit: Payer: Self-pay | Admitting: *Deleted

## 2020-01-17 NOTE — Patient Outreach (Signed)
[facility]) Care Management  01/17/2020  Kristopher Scott 11-23-1955 [id number]   RED ON EMMI ALERT - Stroke Day # 1 Date: 9/7 Red Alert Reason: Not filled all prescriptions, Unable to take every dose of medications, and problems setting up rehab  RED ON EMMI ALERT - Stroke Day # 6 Date: 9/12 Red Alert Reason: Feeling worse overall and New problems with walking/seeing/talking/speaking   Outreach attempt #2, successful.  Identity verified.  This care manager introduced self and stated purpose of call.  [facility] care [facility] services explained.  Wife also present during call.  Patient lives with wife, independent in most ADL's, only needed assistance.  He report his memory has not been great but his wife has been helping him manage his recovery.  They were initially concerned about being able to fill the Entresto due to pharmacy issues and cost but has since been able to fill it at a different pharmacy using a prescription card.  Wife report she will have Jardiance filled today as well.    Member reported feeling worse and/or having problems walking due to intermittent leg pain/numbness.  He has been using a cane to help with stability.  Wife inquires about rehab, advised that the outpatient rehab center has been reportedly behind due to having limitations on capacity.  Provided with contact information, advised to contact office within the next week if they don't receive a call back.  Wife verbalizes understanding.  He has already had follow up with cardiology, will be seen by PCP today, both at the [place] system in [place].  Wife and member deny any urgent concerns at this time, encouraged to contact this care manager with questions.  Plan: RN CM will follow up within the next 2 weeks regarding start of PT.  Will plan to close case at that time as member is not on APL.  [name], [place], MSN [facility] Care Management  [facility] Manager [phone]

## 2020-01-23 ENCOUNTER — Other Ambulatory Visit: Payer: Self-pay | Admitting: *Deleted

## 2020-01-23 NOTE — Patient Outreach (Addendum)
[facility]) Care Management  01/23/2020  ESCHER HARR 15-May-1955 [id number]   RED ON EMMI ALERT - Stroke Day # 13 Date: 9/19 Red Alert Reason: Feeling worse overall and New problems with seeing/talking/walking/speaking   Outreach attempt #1, unsuccessful, HIPAA compliant voice message left.   Update:  Incoming call received from member.  He report doing "alright" but is still having intermittent darkness in his eye, "floating" discomfort in his chest/back/ribs, and trouble staying asleep due to anxiety.  He was seen by cardiologist as well as PCP within the last couple weeks, will have visit with eye doctor this week.  State he does have some relief of the floating discomfort if he take something for gas/indigestion but will speak with his PCP while at the [place] this week.  He will also discuss the possibility of using something for anxiety.  Denies any urgent concerns, state he is increasing his activity as tolerated, was able to take a short walk outside today.  Still have not received call from outpatient PT, will have wife to call.  Denies any urgent concerns, encouraged to contact this care manager with questions.   Plan: RN CM will follow up within the next week, plan to close as member not on APL.  [name], [place], MSN [facility] Care Management  [facility] Manager [phone]

## 2020-02-01 ENCOUNTER — Encounter: Payer: Self-pay | Admitting: Nurse Practitioner

## 2020-02-01 ENCOUNTER — Other Ambulatory Visit: Payer: Self-pay | Admitting: *Deleted

## 2020-02-01 ENCOUNTER — Telehealth (HOSPITAL_COMMUNITY): Payer: Self-pay

## 2020-02-01 NOTE — Telephone Encounter (Signed)
Called patient to see if he is interested in the Cardiac Rehab Program. Patient expressed interest. Explained scheduling process and went over insurance, patient verbalized understanding. Will contact patient for scheduling once f/u has been completed.  °

## 2020-02-01 NOTE — Telephone Encounter (Signed)
Pt is covered thru the [place], [id number].

## 2020-02-01 NOTE — Patient Outreach (Signed)
[facility]) Care Management  02/01/2020  Kristopher Scott [birth year] [id number]   Call placed to member to follow up on stroke recovery.  He report he is doing well, still having intermittent vision issues.  He has seen his eye doctor, diabetic eye exam was complete, will have follow up at the [place] later this week.  He has now received order for outpatient therapy through the [place] as well as through [facility], waiting to see which one will have space for him.  Denies any urgent concerns, he will continue to follow up with his PCP and cardiologist at the [facility].  Encouraged to contact this care manager with questions, will close case at this time.  [name], [place], MSN [facility] Care Management  [facility] Manager [phone]

## 2020-02-03 ENCOUNTER — Encounter (HOSPITAL_COMMUNITY): Payer: Self-pay | Admitting: *Deleted

## 2020-02-03 NOTE — Progress Notes (Signed)
Received [facility] [id number] for this pt to participate in Cardiac Rehab with diagnosis of Chronic Systolic Heart Failure.  Pt with recent hospitalization with CHF exacerbation 8/29-94 at [facility].  On 8/31 pt had left sided weakness and code stroke was called.  Pt with negative head CT. Thought to be related to chronic pain and nerve injury.  Pt declined CIR and was sent home with outpatient therapy. I do not see any therapy sessions in [facility] EMR.  Reviewed follow up progress note with Dr. Sarajane Marek at the [facility] on 9/1 and 9/23.  Pt with diaphragmatic stimulation and was initial scheduled for PPM lead revision at [facility] but this has been rescheduled.  Pt has upcoming venogram at [facility] on 10/6. Will continue to follow for readiness to proceed with scheduling group exercise. Alanson Aly, BSN Cardiac and Emergency planning/management officer

## 2020-02-10 ENCOUNTER — Telehealth (HOSPITAL_COMMUNITY): Payer: Self-pay | Admitting: *Deleted

## 2020-02-10 NOTE — Telephone Encounter (Signed)
Pt called wanting to schedule for cardiac rehab.  Advised pt that we had received VA authorization for him to participate in Cardiac Rehab.  We were waiting for his venogram and lead revision to his PPM.  Pt informed me that unfortunately he was not able to have his venogram completed on 10/6 due to being bump for emergency cases flown into [place]. His venogram has been rescheduled for 10/15.  Once completed determination will be made for date of lead revision.  Advised pt that we will need to wait until both procedures have been completed along with follow up and clearance to engage in upper body group exercise at cardiac rehab.  Pt verbalized understanding and will notify cardiac rehab the date of his PPM lead revision and generator change.  Called and left message with community care coordinator at the [facility] regarding the need for medical delay.  Alanson Aly, BSN Cardiac and Emergency planning/management officer

## 2020-02-22 ENCOUNTER — Telehealth (HOSPITAL_COMMUNITY): Payer: Self-pay | Admitting: *Deleted

## 2020-02-22 NOTE — Telephone Encounter (Signed)
Called and spoke to pt regarding how his procedure went on 10/15 at [facility]. Pt had venogram completed.   From pt understanding, the venogram went well and did not show that the lead placement was responsible for his shortness of breath. No need to replace the device early as originally thought.  Pt has appt with PCP at the [facility] on tomorrow.  Indicated that we would need the okay to proceed with scheduling cardiac rehab based upon recent events.  Pt was given our contact information both phone and fax.  Await clearance in order to move forward with scheduling. Alanson Aly, BSN Cardiac and Emergency planning/management officer

## 2020-02-27 ENCOUNTER — Telehealth (HOSPITAL_COMMUNITY): Payer: Self-pay

## 2020-02-28 ENCOUNTER — Encounter (HOSPITAL_COMMUNITY): Payer: Self-pay

## 2020-03-01 ENCOUNTER — Telehealth: Payer: Self-pay

## 2020-03-01 ENCOUNTER — Encounter: Payer: Self-pay | Admitting: Nurse Practitioner

## 2020-03-01 ENCOUNTER — Ambulatory Visit (INDEPENDENT_AMBULATORY_CARE_PROVIDER_SITE_OTHER): Payer: No Typology Code available for payment source | Admitting: Nurse Practitioner

## 2020-03-01 VITALS — BP 106/62 | HR 84 | Ht 73.0 in | Wt 253.4 lb

## 2020-03-01 DIAGNOSIS — Z1211 Encounter for screening for malignant neoplasm of colon: Secondary | ICD-10-CM

## 2020-03-01 DIAGNOSIS — R1310 Dysphagia, unspecified: Secondary | ICD-10-CM | POA: Diagnosis not present

## 2020-03-01 DIAGNOSIS — K219 Gastro-esophageal reflux disease without esophagitis: Secondary | ICD-10-CM

## 2020-03-01 DIAGNOSIS — I4891 Unspecified atrial fibrillation: Secondary | ICD-10-CM

## 2020-03-01 DIAGNOSIS — I509 Heart failure, unspecified: Secondary | ICD-10-CM

## 2020-03-01 MED ORDER — PEG-KCL-NACL-NASULF-NA ASC-C 100 G PO SOLR: 1.0000 | Freq: Once | ORAL | 0 refills | Status: AC

## 2020-03-01 MED ORDER — PEG-KCL-NACL-NASULF-NA ASC-C 100 G PO SOLR: 1.0000 | Freq: Once | ORAL | 0 refills | Status: DC

## 2020-03-01 NOTE — Progress Notes (Signed)
I agree with the above note, plan 

## 2020-03-01 NOTE — Telephone Encounter (Signed)
Recevied fax from Dr. Andee Poles stating patient can hold Eliquis 48 hours prior to his procedure. Called and left message with patient's wife to return my call.

## 2020-03-01 NOTE — Progress Notes (Signed)
03/01/2020 Kristopher Scott [id number] 12-13-1955   CHIEF COMPLAINT: Schedule an EGD and colonoscopy   HISTORY OF PRESENT ILLNESS:  Kristopher Scott is a 64 year old male with a past medical history of depression, hypertension, paroxysmal atrial fibrillation on Eliquis, nonischemic cardiomyopathy, CHF with LV EF 40-45%, S/P biventricular AICD placement 2016, revision LV lead 02/18/2020, cardiac cath without evidence of CAD in 2016, ascending aortic aneurysm, "mini strokes", hypercholesterolemia, DM II, sleep apnea, headaches, GERD and diverticulosis. Past L/R rotator cuff surgery, left knee surgery and hydrocele surgery.   He was admitted to the hospital 8/29 - 01/07/2020 with CP, SOB with acute on chronic CHF. His chest pain was thought to be atypical, possibly musculoskeletal. He was diuresed. ECHO showed LV EF 40-45%. He was started on Entresto during his hospital admission. On 08/31 he had acute onset difficulty speaking, had left visual neglect and left-sided weakness which was concerning for a stroke.  CT of the head x 2 was negative. He was seen by Kristopher Scott of the stroke team. His left sided weakness was felt to be related to chronic pain and nerve injury.  He is scheduled to see his PCP next week with the intentions of setting up pain neuro consult.  No further visual or left-sided weakness since being discharged from the hospital.  CTA of the chest obtained on 01/01/2020 showed stable 4.8 cm ascending aortic aneurysm in the transverse dimension, aortic atherosclerosis for which he sees a vascular specialist at [facility].  He presents to our office today by Dr. Ashok Scott at [facility] to schedule an EGD and colonoscopy due to having a history of GERD and questionable history of colon polyps. His VA records documented he had difficulty with sedation in the past therefore he requires MAC for his EGD/colonoscopy. His VA records document He underwent a colonoscopy 08/2014 which showed  diverticulosis in the sigmoid colon. The bowel preparation was excellent. No polyps. The patient stated he thought he had colon polyps removed at the time of his one and only colonoscopy done in 2016. His father was diagnosed with colon cancer at the age of 30.  Complains of having chronic left mid abdominal pain adjacent to his umbilicus which has occurred intermittently since the 1980s.  He describes his pain as an empty burning sensation which occurs once or twice monthly and last for a few minutes.  No specific food or stress triggers.  His abdominal pain does not improve or worsen during defecation.  His bowel pattern varies.  He will pass a normal solid stool or several loose stools daily.  His stool color is brown and infrequently he reports seeing a black stool.  He takes Pepto-Bismol as needed for his left mid abdominal burning sensation, however, he reports seeing black stools at times without recent Pepto-Bismol use.  He occasionally sees bright red blood on the toilet tissue or in the toilet bowl.  He has infrequent rectal irritation which occurs typically after straining.  No recent antibiotics.  Heakes Naproxen as needed for arthritis and back pain.  He takes omeprazole 20 mg in the morning and famotidine 20 mg later in the day for heartburn.  He reports having GERD symptoms for the past 2 to 3 years.  He also has difficulty swallowing pills or liquids which sometimes gets stuck to the midesophagus, he burps than the stuck pill or fluid passes down the esophagus.  Intermittent nausea.  No weight loss.  No chest pain or palpitations.  No current shortness of breath.  His wife is present.   Labs 02/08/2020: WBC 3.8.  Hemoglobin 14.9.  Hematocrit 44.9.  Platelet 169.  CBC Latest Ref Rng & Units 01/03/2020 01/01/2020 09/10/2017  WBC 4.0 - 10.5 K/uL 3.5(L) 4.7 4.2  Hemoglobin 13.0 - 17.0 g/dL 14.1 14.1 14.8  Hematocrit 39 - 52 % 43.2 42.0 44.7  Platelets 150 - 400 K/uL 182 187 180   CMP Latest Ref Rng  & Units 01/07/2020 01/06/2020 01/05/2020  Glucose 70 - 99 mg/dL 153(H) 152(H) 150(H)  BUN 8 - 23 mg/dL _0 Creatinine 0.61 - 1.24 mg/dL 1.30(H) 1.31(H) 1.36(H)  Sodium 135 - 145 mmol/L 133(L) 136 137  Potassium 3.5 - 5.1 mmol/L 4.0 4.1 3.5  Chloride 98 - 111 mmol/L 103 104 103  CO2 22 - 32 mmol/L _1 Calcium 8.9 - 10.3 mg/dL 9.2 9.6 9.0  Total Protein 6.5 - 8.1 g/dL - - -  Total Bilirubin 0.3 - 1.2 mg/dL - - -  Alkaline Phos 38 - 126 U/L - - -  AST 15 - 41 U/L - - -  ALT 0 - 44 U/L - - -    Chest CTA 01/01/2020: 1. No acute aortic abnormality. 2. Stable ascending aortic aneurysm measuring 4.8 cm in transverse dimension. Ascending thoracic aortic aneurysm. Recommend semi-annual imaging followup by CTA or MRA   Echocardiogram 01/03/2020 1. Left ventricular ejection fraction, by estimation, is 40 to 45%. The  left ventricle has mildly decreased function. The left ventricle  demonstrates global hypokinesis with mild septal-lateral dyssynchrony.  There is moderate left ventricular  hypertrophy. Left ventricular diastolic parameters are consistent with  Grade I diastolic dysfunction (impaired relaxation).  2. Right ventricular systolic function is mildly reduced. The right  ventricular size is normal. There is normal pulmonary artery systolic  pressure. The estimated right ventricular systolic pressure is 06.2 mmHg.  3. The mitral valve is normal in structure. Trivial mitral valve  regurgitation. No evidence of mitral stenosis.  4. The aortic valve is tricuspid. Aortic valve regurgitation is mild to  moderate. No aortic stenosis is present.  5. Aortic dilatation noted. There is moderate dilatation of the ascending  aorta measuring 48 mm. Would consider dedicated imaging by CTA or MRA to  further assess.  6. The inferior vena cava is normal in size with greater than 50%  respiratory variability, suggesting right atrial pressure of 3 mmHg.      Past Medical History:   Diagnosis Date  . Back pain    ESI/TENS/therapy  . Cardiac defibrillator in place 12/2014  . Cluster headaches   . Diabetes mellitus without complication ([place])   . Hypertension   . Pinched vertebral nerve    pain/burning from left thigh to left foot at times   Past Surgical History:  Procedure Laterality Date  . HYDROCELE EXCISION / REPAIR    . ROTATOR CUFF REPAIR Right 04/2012   Social History:  He is married. Retired. Nonsmoker. He drinks one alcoholic beverage daily or less. No drug use.   Family History: Mother with heart disease, hypertension and diabetes. Brothers with heart disease.    family history includes Diabetes in his mother; Hypertension in his mother. Allergies  Allergen Reactions  . Spironolactone Other (See Comments)  . Lisinopril Cough  . Propranolol Itching      Outpatient Encounter Medications as of 03/01/2020  Medication Sig  . allopurinol (ZYLOPRIM) 100 MG tablet Take 100 mg by mouth daily.   Marland Kitchen  Apixaban (ELIQUIS PO) Take 5 mg by mouth 2 (two) times daily.   Marland Kitchen atorvastatin (LIPITOR) 80 MG tablet Take 40 mg by mouth daily.   . carvedilol (COREG) 6.25 MG tablet Take 3.125 mg by mouth 2 (two) times daily with a meal.  . clonazePAM (KLONOPIN) 2 MG tablet Take 2 mg by mouth 2 (two) times daily as needed for anxiety (sleep).  . empagliflozin (JARDIANCE) 10 MG TABS tablet Take 1 tablet (10 mg total) by mouth daily.  Marland Kitchen erythromycin with ethanol (ERYGEL) 2 % gel Apply topically daily. To affected area in between toes of left foot  . furosemide (LASIX) 20 MG tablet Take 1 tablet (20 mg total) by mouth daily.  Marland Kitchen HYDROcodone-acetaminophen (NORCO/VICODIN) 5-325 MG tablet Take 1 tablet by mouth every 6 (six) hours as needed for moderate pain.   . isosorbide mononitrate (IMDUR) 60 MG 24 hr tablet Take 60 mg by mouth daily.   Marland Kitchen ketoconazole (NIZORAL) 2 % cream Apply 1 fingertip amount to bottom of each foot daily.  . naproxen (NAPROSYN) 375 MG tablet Take 1 tablet  (375 mg total) by mouth 2 (two) times daily. (Patient taking differently: Take 375 mg by mouth 2 (two) times daily as needed for mild pain. )  . nitroGLYCERIN (NITROSTAT) 0.4 MG SL tablet Place 0.4 mg under the tongue every 5 (five) minutes as needed for chest pain.   . prazosin (MINIPRESS) 5 MG capsule Take 5 mg by mouth at bedtime.  . pregabalin (LYRICA) 75 MG capsule Take 75 mg by mouth daily.  . sacubitril-valsartan (ENTRESTO) 24-26 MG Take 1 tablet by mouth 2 (two) times daily.   No facility-administered encounter medications on file as of 03/01/2020.    REVIEW OF SYSTEMS:   Gen: No recent fever. No sweats or chills. No weight loss.  CV: Denies chest pain or palpitation. Occasional swelling of leg and feet.  Resp: No current SOB. Denies cough. No hemoptysis.  GI: See HPI.  GU : ? blood in urine. Urine leakage, excessive urination.  MS: Arthritis pain, + back pain.  Derm: Denies rash, itchiness, skin lesions or unhealing ulcers. Psych: + anxiety and depression.  Heme: Denies bruising, bleeding. Neuro:  See HPI. + headaches. No dizziness.  Endo:  + DM II, excessive thirtst.    PHYSICAL EXAM: Ht _0  (1.854 m)   Wt 253 lb 6.4 oz (114.9 kg)   BMI 33.43 kg/m   General: Well developed 64 year old male in no acute distress. Head: Normocephalic and atraumatic. Eyes:  Sclerae non-icteric, conjunctive pink. Ears: Normal auditory acuity. Mouth: Dentition intact. No ulcers or lesions.  Neck: Supple, no lymphadenopathy or thyromegaly.  Lungs: Clear bilaterally to auscultation without wheezes, crackles or rhonchi. Heart: Regular rate and rhythm. No murmur, rub or gallop appreciated.  Abdomen: Soft, nontender, non distended. No masses. No hepatosplenomegaly. Normoactive bowel sounds x 4 quadrants.  Rectal:  Musculoskeletal: Symmetrical with no gross deformities. Skin: Warm and dry. No rash or lesions on visible extremities. Extremities: No edema. Neurological: Alert oriented x 4, no  focal deficits.  Psychological:  Alert and cooperative. Normal mood and affect.  ASSESSMENT AND PLAN:  53. 64 year old male with a questionable history of colon polyps.  Rectal bleeding.  Family history of colon cancer (father).  -Colonoscopy benefits and risks discussed including risk with sedation, risk of bleeding, perforation and infection  -Request copy of colonoscopy procedure and biopsy report 2016 from the [place]  2. GERD -EGD benefits and risks discussed  including risk with sedation, risk of bleeding, perforation and infection  -Increase Famotidine 20 mg twice daily.  Continue Omeprazole 20 mg every morning.  3. CHF, cardiomyopathy with EF 40-45%  s/p AICD 2016, revision LV lead 02/18/2020  4. Paroxysmal atrial fibrillation on Eliquis  -Our office will contact cardiologist Dr. Reece Agar to verify Eliquis instructions prior to EGD/colonoscopy   5. Ascending aortic aneurysm 4.8 cm, stable   6. DM II  Further follow-up to be determined after EGD and colonoscopy completed    CC:  No ref. provider found

## 2020-03-01 NOTE — Patient Instructions (Signed)
Increase famotidine 20 mg to twice daily.   Continue omeprazole 20 mg every morning.   Take over the counter Benefiber 1 tablespoon daily and continue the probiotic of your choice once daily.   You have been scheduled for an endoscopy and colonoscopy. Please follow the written instructions given to you at your visit today. Please pick up your prep supplies at the pharmacy within the next 1-3 days. If you use inhalers (even only as needed), please bring them with you on the day of your procedure.  You will be contacted by our office prior to your procedure for directions on holding your Eliquis.  If you do not hear from our office 1 week prior to your scheduled procedure, please call [phone] to discuss.

## 2020-03-01 NOTE — Telephone Encounter (Signed)
   DEMERIUS PODOLAK 18-Oct-1955 [id number]  Dear Dr. Wanda Plump:  We have scheduled the above named patient for a(n) Upper Endoscopy/Colonoscopy procedure. Our records show that (s)he is on anticoagulation therapy.  Please advise as to whether the patient may come off their therapy of Eliquis 2 days prior to their procedure which is scheduled for 04/02/20.  Please route your response to Jillene Bucks, CMA or fax response to [phone].  Sincerely,    [facility]

## 2020-03-01 NOTE — Telephone Encounter (Signed)
Informed patient he can hold Eliquis 2 days prior to his procedure per [facility] doctor, Dr. Andee Poles. Patient agreed and verbalized understanding.

## 2020-03-05 ENCOUNTER — Telehealth (HOSPITAL_COMMUNITY): Payer: Self-pay

## 2020-03-05 NOTE — Telephone Encounter (Signed)
Called patient to see if he was interested in participating in the Cardiac Rehab Program. Patient stated yes. Patient will come in for orientation on 03/22/2020@9 :00 and will attend the 1:15pm exercise class.  Mailed homework package

## 2020-03-09 ENCOUNTER — Telehealth (HOSPITAL_COMMUNITY): Payer: Self-pay

## 2020-03-22 ENCOUNTER — Ambulatory Visit (HOSPITAL_COMMUNITY): Payer: Non-veteran care

## 2020-03-23 ENCOUNTER — Encounter (HOSPITAL_COMMUNITY): Payer: Self-pay

## 2020-03-26 ENCOUNTER — Ambulatory Visit (HOSPITAL_COMMUNITY): Payer: Non-veteran care

## 2020-03-28 ENCOUNTER — Other Ambulatory Visit: Payer: Self-pay | Admitting: Neurological Surgery

## 2020-03-28 ENCOUNTER — Ambulatory Visit (HOSPITAL_COMMUNITY): Payer: Non-veteran care

## 2020-03-30 ENCOUNTER — Ambulatory Visit (HOSPITAL_COMMUNITY): Payer: Non-veteran care

## 2020-04-02 ENCOUNTER — Encounter: Payer: Self-pay | Admitting: Gastroenterology

## 2020-04-02 ENCOUNTER — Other Ambulatory Visit: Payer: Self-pay | Admitting: Neurological Surgery

## 2020-04-02 ENCOUNTER — Ambulatory Visit (AMBULATORY_SURGERY_CENTER): Payer: No Typology Code available for payment source | Admitting: Gastroenterology

## 2020-04-02 ENCOUNTER — Ambulatory Visit (HOSPITAL_COMMUNITY): Payer: Non-veteran care

## 2020-04-02 ENCOUNTER — Other Ambulatory Visit: Payer: Self-pay

## 2020-04-02 VITALS — BP 115/68 | HR 56 | Temp 96.8°F | Resp 15

## 2020-04-02 DIAGNOSIS — Z1211 Encounter for screening for malignant neoplasm of colon: Secondary | ICD-10-CM

## 2020-04-02 DIAGNOSIS — K219 Gastro-esophageal reflux disease without esophagitis: Secondary | ICD-10-CM

## 2020-04-02 DIAGNOSIS — K2950 Unspecified chronic gastritis without bleeding: Secondary | ICD-10-CM | POA: Diagnosis not present

## 2020-04-02 DIAGNOSIS — R12 Heartburn: Secondary | ICD-10-CM

## 2020-04-02 DIAGNOSIS — K2970 Gastritis, unspecified, without bleeding: Secondary | ICD-10-CM

## 2020-04-02 DIAGNOSIS — Z800 Family history of malignant neoplasm of digestive organs: Secondary | ICD-10-CM

## 2020-04-02 DIAGNOSIS — K31819 Angiodysplasia of stomach and duodenum without bleeding: Secondary | ICD-10-CM

## 2020-04-02 MED ORDER — SODIUM CHLORIDE 0.9 % IV SOLN: 500.0000 mL | Freq: Once | INTRAVENOUS | Status: DC

## 2020-04-02 NOTE — Progress Notes (Signed)
Called to room to assist during endoscopic procedure.  Patient ID and intended procedure confirmed with present staff. Received instructions for my participation in the procedure from the performing physician.  

## 2020-04-02 NOTE — Patient Instructions (Signed)
Resume previous medications.  Resume Eliquis today.  Await pathology for final recommendations.  Handouts on findings given to patient.  Diverticulosis. Several non- bleeding AVMs.     YOU HAD AN ENDOSCOPIC PROCEDURE TODAY AT THE [facility]:   Refer to the procedure report that was given to you for any specific questions about what was found during the examination.  If the procedure report does not answer your questions, please call your gastroenterologist to clarify.  If you requested that your care partner not be given the details of your procedure findings, then the procedure report has been included in a sealed envelope for you to review at your convenience later.  YOU SHOULD EXPECT: Some feelings of bloating in the abdomen. Passage of more gas than usual.  Walking can help get rid of the air that was put into your GI tract during the procedure and reduce the bloating. If you had a lower endoscopy (such as a colonoscopy or flexible sigmoidoscopy) you may notice spotting of blood in your stool or on the toilet paper. If you underwent a bowel prep for your procedure, you may not have a normal bowel movement for a few days.  Please Note:  You might notice some irritation and congestion in your nose or some drainage.  This is from the oxygen used during your procedure.  There is no need for concern and it should clear up in a day or so.  SYMPTOMS TO REPORT IMMEDIATELY:   Following lower endoscopy (colonoscopy or flexible sigmoidoscopy):  Excessive amounts of blood in the stool  Significant tenderness or worsening of abdominal pains  Swelling of the abdomen that is new, acute  Fever of 100F or higher   Following upper endoscopy (EGD)  Vomiting of blood or coffee ground material  New chest pain or pain under the shoulder blades  Painful or persistently difficult swallowing  New shortness of breath  Fever of 100F or higher  Black, tarry-looking stools  For urgent or emergent  issues, a gastroenterologist can be reached at any hour by calling [phone]. Do not use MyChart messaging for urgent concerns.    DIET:  We do recommend a small meal at first, but then you may proceed to your regular diet.  Drink plenty of fluids but you should avoid alcoholic beverages for 24 hours.  ACTIVITY:  You should plan to take it easy for the rest of today and you should NOT DRIVE or use heavy machinery until tomorrow (because of the sedation medicines used during the test).    FOLLOW UP: Our staff will call the number listed on your records 48-72 hours following your procedure to check on you and address any questions or concerns that you may have regarding the information given to you following your procedure. If we do not reach you, we will leave a message.  We will attempt to reach you two times.  During this call, we will ask if you have developed any symptoms of COVID 19. If you develop any symptoms (ie: fever, flu-like symptoms, shortness of breath, cough etc.) before then, please call [phone].  If you test positive for Covid 19 in the 2 weeks post procedure, please call and report this information to [place].    If any biopsies were taken you will be contacted by phone or by letter within the next 1-3 weeks.  Please call us at [phone] if you have not heard about the biopsies in 3 weeks.    SIGNATURES/CONFIDENTIALITY:  You and/or your care partner have signed paperwork which will be entered into your electronic medical record.  These signatures attest to the fact that that the information above on your After Visit Summary has been reviewed and is understood.  Full responsibility of the confidentiality of this discharge information lies with you and/or your care-partner.

## 2020-04-02 NOTE — Progress Notes (Signed)
Nancy Campbell LPN vital signs. 

## 2020-04-02 NOTE — Progress Notes (Signed)
pt tolerated well. VSS. awake and to recovery. Report given to RN. Bite block inserted and removed without trauma. 

## 2020-04-02 NOTE — Op Note (Signed)
[facility] Patient Name: Kristopher Scott Procedure Date: 04/02/2020 1:59 PM MRN: [id number] Endoscopist: Rachael Fee , MD Age: 64 Referring MD:  Date of Birth: 1956/01/14 Gender: Male Account #: [id number] Procedure:                Colonoscopy Indications:              Screening for colorectal malignant neoplasm; V.A.                            system referred him for colonscopy; father had                            colon cancer in his 89s, he had colonoscopy 2016                            and no polyp were found Medicines:                Monitored Anesthesia Care Procedure:                Pre-Anesthesia Assessment:                           - Prior to the procedure, a History and Physical                            was performed, and patient medications and                            allergies were reviewed. The patient's tolerance of                            previous anesthesia was also reviewed. The risks                            and benefits of the procedure and the sedation                            options and risks were discussed with the patient.                            All questions were answered, and informed consent                            was obtained. Prior Anticoagulants: The patient has                            taken Eliquis (apixaban), last dose was 2 days                            prior to procedure. ASA Grade Assessment: III - A                            patient with severe systemic disease. After  reviewing the risks and benefits, the patient was                            deemed in satisfactory condition to undergo the                            procedure.                           After obtaining informed consent, the colonoscope                            was passed under direct vision. Throughout the                            procedure, the patient's blood pressure, pulse, and                             oxygen saturations were monitored continuously. The                            Colonoscope was introduced through the anus and                            advanced to the the cecum, identified by                            appendiceal orifice and ileocecal valve. The                            colonoscopy was performed without difficulty. The                            patient tolerated the procedure well. The quality                            of the bowel preparation was good. The ileocecal                            valve, appendiceal orifice, and rectum were                            photographed. Scope In: 2:07:33 PM Scope Out: 2:16:31 PM Scope Withdrawal Time: 0 hours 6 minutes 33 seconds  Total Procedure Duration: 0 hours 8 minutes 58 seconds  Findings:                 Multiple small and large-mouthed diverticula were                            found in the left colon.                           The exam was otherwise without abnormality on  direct and retroflexion views. Complications:            No immediate complications. Estimated blood loss:                            None. Estimated Blood Loss:     Estimated blood loss: none. Impression:               - Diverticulosis in the left colon.                           - The examination was otherwise normal on direct                            and retroflexion views.                           - No polyps or cancers. Recommendation:           - EGD now.                           - Repeat colonoscopy in 10 years for screening. Rachael Fee, MD 04/02/2020 2:25:42 PM This report has been signed electronically.

## 2020-04-02 NOTE — Op Note (Signed)
[facility] Patient Name: Kristopher Scott Procedure Date: 04/02/2020 1:58 PM MRN: [id number] Endoscopist: Rachael Fee , MD Age: 64 Referring MD:  Date of Birth: Sep 18, 1955 Gender: Male Account #: [id number] Procedure:                Upper GI endoscopy Indications:              Dysphagia, Heartburn; Referred by V.A. system for                            an EGD Medicines:                Monitored Anesthesia Care Procedure:                Pre-Anesthesia Assessment:                           - Prior to the procedure, a History and Physical                            was performed, and patient medications and                            allergies were reviewed. The patient's tolerance of                            previous anesthesia was also reviewed. The risks                            and benefits of the procedure and the sedation                            options and risks were discussed with the patient.                            All questions were answered, and informed consent                            was obtained. Prior Anticoagulants: The patient has                            taken Eliquis (apixaban), last dose was 2 days                            prior to procedure. ASA Grade Assessment: III - A                            patient with severe systemic disease. After                            reviewing the risks and benefits, the patient was                            deemed in satisfactory condition to undergo the  procedure.                           After obtaining informed consent, the endoscope was                            passed under direct vision. Throughout the                            procedure, the patient's blood pressure, pulse, and                            oxygen saturations were monitored continuously. The                            Endoscope was introduced through the mouth, and                             advanced to the second part of duodenum. The upper                            GI endoscopy was accomplished without difficulty.                            The patient tolerated the procedure well. Scope In: Scope Out: Findings:                 Diffuse gastric erythema and spoke-like pattern of                            gastritis in the antrum, pre-pylorus. Biopsies                            taken.                           Several small non-bleeding AVMs in the duodenal                            bulb and second portion.                           The exam was otherwise without abnormality. Complications:            No immediate complications. Estimated blood loss:                            None. Estimated Blood Loss:     Estimated blood loss: none. Impression:               - Gastritis, ? H. pylori and/or GAVE. Biopsies taken                           - Non-bleeding duodenal AVMs                           - The examination was otherwise normal. Recommendation:           -  Patient has a contact number available for                            emergencies. The signs and symptoms of potential                            delayed complications were discussed with the                            patient. Return to normal activities tomorrow.                            Written discharge instructions were provided to the                            patient.                           - Resume previous diet.                           - Continue present medications. You can resume your                            blood thinner today.                           - Await pathology results. Rachael Fee, MD 04/02/2020 2:29:35 PM This report has been signed electronically.

## 2020-04-04 ENCOUNTER — Ambulatory Visit (HOSPITAL_COMMUNITY): Payer: Non-veteran care

## 2020-04-04 ENCOUNTER — Telehealth: Payer: Self-pay | Admitting: *Deleted

## 2020-04-04 NOTE — Telephone Encounter (Signed)
  Follow up Call-  Call back number 04/02/2020  Post procedure Call Back phone  # [phone]  Permission to leave phone message Yes  Some recent data might be hidden     Patient questions:   Do you have a fever, pain , or abdominal swelling? No. Pain Score  0 *  Have you tolerated food without any problems? Yes.    Have you been able to return to your normal activities? Yes.    Do you have any questions about your discharge instructions: Diet   No. Medications  No. Follow up visit  No.  Do you have questions or concerns about your Care? No.  Actions: * If pain score is 4 or above: No action needed, pain <4.  1. Have you developed a fever since your procedure? no  2.   Have you had an respiratory symptoms (SOB or cough) since your procedure? no  3.   Have you tested positive for COVID 19 since your procedure no  4.   Have you had any family members/close contacts diagnosed with the COVID 19 since your procedure?  no   If yes to any of these questions please route to Laverna Peace, RN and Karlton Lemon, RN

## 2020-04-04 NOTE — Telephone Encounter (Signed)
Attempted f/u phone call. No answer. Left message. °

## 2020-04-06 ENCOUNTER — Telehealth (HOSPITAL_COMMUNITY): Payer: Self-pay

## 2020-04-06 ENCOUNTER — Ambulatory Visit (HOSPITAL_COMMUNITY): Payer: Non-veteran care

## 2020-04-06 NOTE — Telephone Encounter (Signed)
No response from pt.  Closed referral

## 2020-04-09 ENCOUNTER — Ambulatory Visit (HOSPITAL_COMMUNITY): Payer: Non-veteran care

## 2020-04-10 ENCOUNTER — Encounter: Payer: Self-pay | Admitting: Gastroenterology

## 2020-04-11 ENCOUNTER — Ambulatory Visit (HOSPITAL_COMMUNITY): Payer: Non-veteran care

## 2020-04-13 ENCOUNTER — Ambulatory Visit (HOSPITAL_COMMUNITY): Payer: Non-veteran care

## 2020-04-16 ENCOUNTER — Ambulatory Visit (HOSPITAL_COMMUNITY): Payer: Non-veteran care

## 2020-04-18 ENCOUNTER — Ambulatory Visit (HOSPITAL_COMMUNITY): Payer: Non-veteran care

## 2020-04-20 ENCOUNTER — Telehealth (HOSPITAL_COMMUNITY): Payer: Self-pay

## 2020-04-20 ENCOUNTER — Telehealth: Payer: Self-pay

## 2020-04-20 ENCOUNTER — Other Ambulatory Visit: Payer: Self-pay | Admitting: Neurological Surgery

## 2020-04-20 ENCOUNTER — Ambulatory Visit (HOSPITAL_COMMUNITY): Payer: Non-veteran care

## 2020-04-20 DIAGNOSIS — M4727 Other spondylosis with radiculopathy, lumbosacral region: Secondary | ICD-10-CM

## 2020-04-20 NOTE — Telephone Encounter (Signed)
Phone call to patient to verify medication list and allergies for myelogram procedure. Medications pt is currently taking are safe to continue to take. Advised pt if any new medications are started prior to procedure to call and make [place] aware. Pt also instructed to have a driver the day of the procedure, he will be with [place] around 2 hours, and discharge instructions reviewed. Pt verbalized understanding.

## 2020-04-20 NOTE — Telephone Encounter (Signed)
Returned pt phone call in regards to CR, pt stated he is ready to start CR. Adv pt [facility] will [facility] to [facility] and explained scheduling process and went over insurance, patient verbalized understanding.

## 2020-04-21 ENCOUNTER — Emergency Department (HOSPITAL_COMMUNITY)
Admission: EM | Admit: 2020-04-21 | Discharge: 2020-04-22 | Disposition: A | Discharge status: Home / Self Care | Payer: No Typology Code available for payment source | Attending: Emergency Medicine | Admitting: Emergency Medicine

## 2020-04-21 ENCOUNTER — Emergency Department (HOSPITAL_COMMUNITY): Payer: No Typology Code available for payment source

## 2020-04-21 ENCOUNTER — Encounter (HOSPITAL_COMMUNITY): Payer: Self-pay | Admitting: Emergency Medicine

## 2020-04-21 ENCOUNTER — Other Ambulatory Visit: Payer: Self-pay

## 2020-04-21 DIAGNOSIS — Z95810 Presence of automatic (implantable) cardiac defibrillator: Secondary | ICD-10-CM | POA: Diagnosis not present

## 2020-04-21 DIAGNOSIS — R519 Headache, unspecified: Secondary | ICD-10-CM | POA: Diagnosis not present

## 2020-04-21 DIAGNOSIS — Z7901 Long term (current) use of anticoagulants: Secondary | ICD-10-CM | POA: Insufficient documentation

## 2020-04-21 DIAGNOSIS — E119 Type 2 diabetes mellitus without complications: Secondary | ICD-10-CM | POA: Insufficient documentation

## 2020-04-21 DIAGNOSIS — Z79899 Other long term (current) drug therapy: Secondary | ICD-10-CM | POA: Insufficient documentation

## 2020-04-21 DIAGNOSIS — M549 Dorsalgia, unspecified: Secondary | ICD-10-CM | POA: Diagnosis not present

## 2020-04-21 DIAGNOSIS — M542 Cervicalgia: Secondary | ICD-10-CM | POA: Insufficient documentation

## 2020-04-21 DIAGNOSIS — R0789 Other chest pain: Secondary | ICD-10-CM | POA: Diagnosis not present

## 2020-04-21 DIAGNOSIS — I509 Heart failure, unspecified: Secondary | ICD-10-CM | POA: Diagnosis not present

## 2020-04-21 DIAGNOSIS — M79641 Pain in right hand: Secondary | ICD-10-CM | POA: Insufficient documentation

## 2020-04-21 DIAGNOSIS — I110 Hypertensive heart disease with heart failure: Secondary | ICD-10-CM | POA: Diagnosis not present

## 2020-04-21 DIAGNOSIS — Y92410 Unspecified street and highway as the place of occurrence of the external cause: Secondary | ICD-10-CM | POA: Insufficient documentation

## 2020-04-21 MED ORDER — ACETAMINOPHEN 500 MG PO TABS: 500.0000 mg | ORAL_TABLET | Freq: Once | ORAL | Status: DC
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
## 2020-04-22 ENCOUNTER — Emergency Department (HOSPITAL_COMMUNITY): Payer: No Typology Code available for payment source

## 2020-04-22 MED ORDER — HYDROCODONE-ACETAMINOPHEN 5-325 MG PO TABS: 1.0000 | ORAL_TABLET | Freq: Four times a day (QID) | ORAL | 0 refills | Status: DC | PRN

## 2020-04-22 MED ORDER — ONDANSETRON 4 MG PO TBDP
4.0000 mg | ORAL_TABLET | Freq: Once | ORAL | Status: AC
  Administered 2020-04-22: 05:00:00 4 mg via ORAL
  Filled 2020-04-22: qty 1

## 2020-04-22 MED ORDER — OXYCODONE-ACETAMINOPHEN 5-325 MG PO TABS
1.0000 | ORAL_TABLET | Freq: Once | ORAL | Status: AC
  Administered 2020-04-22: 05:00:00 1 via ORAL
  Filled 2020-04-22: qty 1

## 2020-04-22 MED ORDER — IBUPROFEN 800 MG PO TABS: 800.0000 mg | ORAL_TABLET | Freq: Three times a day (TID) | ORAL | 0 refills | Status: DC | PRN

## 2020-04-22 MED ORDER — METHOCARBAMOL 500 MG PO TABS: 500.0000 mg | ORAL_TABLET | Freq: Three times a day (TID) | ORAL | 0 refills | Status: DC | PRN

## 2020-04-22 NOTE — Discharge Instructions (Addendum)
You may alternate Tylenol 1000 mg every 6 hours as needed for pain, fever and Ibuprofen 800 mg every 8 hours as needed for pain, fever.  Please take Ibuprofen with food.  Do not take more than 4000 mg of Tylenol (acetaminophen) in a 24 hour period.  
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
## 2020-04-23 ENCOUNTER — Ambulatory Visit (HOSPITAL_COMMUNITY): Payer: Non-veteran care

## 2020-04-25 ENCOUNTER — Ambulatory Visit (HOSPITAL_COMMUNITY): Payer: Non-veteran care

## 2020-04-30 ENCOUNTER — Ambulatory Visit (HOSPITAL_COMMUNITY): Payer: Non-veteran care

## 2020-05-02 ENCOUNTER — Ambulatory Visit
Admission: RE | Admit: 2020-05-02 | Discharge: 2020-05-02 | Disposition: A | Discharge status: Home / Self Care | Payer: No Typology Code available for payment source | Source: Ambulatory Visit | Attending: Neurological Surgery | Admitting: Neurological Surgery

## 2020-05-02 ENCOUNTER — Ambulatory Visit (HOSPITAL_COMMUNITY): Payer: Non-veteran care

## 2020-05-02 DIAGNOSIS — M4727 Other spondylosis with radiculopathy, lumbosacral region: Secondary | ICD-10-CM

## 2020-05-02 MED ORDER — ONDANSETRON HCL 4 MG/2ML IJ SOLN
4.0000 mg | Freq: Once | INTRAMUSCULAR | Status: AC
  Administered 2020-05-02: 11:00:00 4 mg via INTRAMUSCULAR

## 2020-05-02 MED ORDER — DIAZEPAM 5 MG PO TABS
10.0000 mg | ORAL_TABLET | Freq: Once | ORAL | Status: AC
  Administered 2020-05-02: 10:00:00 10 mg via ORAL

## 2020-05-02 MED ORDER — MEPERIDINE HCL 50 MG/ML IJ SOLN
50.0000 mg | Freq: Once | INTRAMUSCULAR | Status: AC
Start: 2020-05-02 — End: 2020-05-02
  Administered 2020-05-02: 11:00:00 50 mg via INTRAMUSCULAR

## 2020-05-02 MED ORDER — IOPAMIDOL (ISOVUE-M 300) INJECTION 61%
10.0000 mL | Freq: Once | INTRAMUSCULAR | Status: AC
  Administered 2020-05-02: 10 mL via INTRATHECAL

## 2020-05-02 NOTE — Discharge Instr - Other Orders (Signed)
1035: Pt c/o pain 10/10 in back from myelogram procedure. See MAR.

## 2020-05-02 NOTE — Discharge Instructions (Signed)

## 2020-05-07 ENCOUNTER — Ambulatory Visit (HOSPITAL_COMMUNITY): Payer: Non-veteran care

## 2020-05-09 ENCOUNTER — Ambulatory Visit (HOSPITAL_COMMUNITY): Payer: Non-veteran care

## 2020-05-11 ENCOUNTER — Ambulatory Visit (HOSPITAL_COMMUNITY): Payer: Non-veteran care

## 2020-05-14 ENCOUNTER — Ambulatory Visit (HOSPITAL_COMMUNITY): Payer: Non-veteran care

## 2020-05-16 ENCOUNTER — Ambulatory Visit (HOSPITAL_COMMUNITY): Payer: Non-veteran care

## 2020-05-18 ENCOUNTER — Ambulatory Visit (HOSPITAL_COMMUNITY): Payer: Non-veteran care

## 2020-05-31 ENCOUNTER — Telehealth (HOSPITAL_COMMUNITY): Payer: Self-pay

## 2020-06-01 ENCOUNTER — Other Ambulatory Visit: Payer: Self-pay | Admitting: Chiropractic Medicine

## 2020-06-01 ENCOUNTER — Encounter (HOSPITAL_COMMUNITY): Payer: Self-pay | Admitting: *Deleted

## 2020-06-01 DIAGNOSIS — M25511 Pain in right shoulder: Secondary | ICD-10-CM
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
## 2020-06-08 ENCOUNTER — Encounter (HOSPITAL_COMMUNITY): Payer: Self-pay

## 2020-06-08 ENCOUNTER — Telehealth (HOSPITAL_COMMUNITY): Payer: Self-pay

## 2020-06-08 NOTE — Telephone Encounter (Signed)
Attempted to call patient in regards to Cardiac Rehab - LM on VM Mailed letter 

## 2020-06-20 ENCOUNTER — Ambulatory Visit
Admission: RE | Admit: 2020-06-20 | Discharge: 2020-06-20 | Disposition: A | Discharge status: Home / Self Care | Payer: Medicare PPO | Source: Ambulatory Visit | Attending: Chiropractic Medicine | Admitting: Chiropractic Medicine

## 2020-06-20 ENCOUNTER — Other Ambulatory Visit: Payer: Self-pay

## 2020-06-20 DIAGNOSIS — M25511 Pain in right shoulder: Secondary | ICD-10-CM

## 2020-06-21 ENCOUNTER — Telehealth (HOSPITAL_COMMUNITY): Payer: Self-pay

## 2020-06-21 NOTE — Telephone Encounter (Signed)
Attempted to call pt in regards to cardiac rehab, unable to leave VM. Letter was mailed out for pt to contact the office.

## 2020-06-25 NOTE — Telephone Encounter (Signed)
No response from pt.  Closed referral  

## 2020-07-06 ENCOUNTER — Telehealth (HOSPITAL_COMMUNITY): Payer: Self-pay

## 2020-07-06 NOTE — Telephone Encounter (Signed)
Someone from the [facility] [place] called to ask if pt scheduled with cardiac rehab, advised her that we have tried multiple times to reach out to the pt witrh no response from him and that his cardiac rehab referral was closed. She stated that they have also been trying to contact pt with no response and too keep pt referral closed and that they were closing his auth for cardiac rehab.

## 2022-02-01 ENCOUNTER — Other Ambulatory Visit: Payer: Self-pay

## 2022-02-01 ENCOUNTER — Emergency Department (HOSPITAL_COMMUNITY)
Admission: EM | Admit: 2022-02-01 | Discharge: 2022-02-02 | Discharge status: Against Medical Advice | Payer: No Typology Code available for payment source | Attending: Student | Admitting: Student

## 2022-02-01 ENCOUNTER — Emergency Department (HOSPITAL_COMMUNITY): Payer: No Typology Code available for payment source

## 2022-02-01 DIAGNOSIS — Z7982 Long term (current) use of aspirin: Secondary | ICD-10-CM | POA: Diagnosis not present

## 2022-02-01 DIAGNOSIS — R0602 Shortness of breath: Secondary | ICD-10-CM | POA: Insufficient documentation

## 2022-02-01 DIAGNOSIS — Z5321 Procedure and treatment not carried out due to patient leaving prior to being seen by health care provider: Secondary | ICD-10-CM | POA: Insufficient documentation

## 2022-02-01 DIAGNOSIS — R0789 Other chest pain: Secondary | ICD-10-CM | POA: Diagnosis present

## 2022-02-01 LAB — CBC
HCT: 45.9 % (ref 39.0–52.0)
Hemoglobin: 15.5 g/dL (ref 13.0–17.0)
MCH: 28.7 pg (ref 26.0–34.0)
MCHC: 33.8 g/dL (ref 30.0–36.0)
MCV: 85 fL (ref 80.0–100.0)
Platelets: 208 10*3/uL (ref 150–400)
RBC: 5.4 MIL/uL (ref 4.22–5.81)
RDW: 15.9 % — ABNORMAL HIGH (ref 11.5–15.5)
WBC: 4.6 10*3/uL (ref 4.0–10.5)
nRBC: 0 % (ref 0.0–0.2)

## 2022-02-01 LAB — BASIC METABOLIC PANEL
Anion gap: 12 (ref 5–15)
BUN: 16 mg/dL (ref 8–23)
CO2: 21 mmol/L — ABNORMAL LOW (ref 22–32)
Calcium: 10.2 mg/dL (ref 8.9–10.3)
Chloride: 108 mmol/L (ref 98–111)
Creatinine, Ser: 1.32 mg/dL — ABNORMAL HIGH (ref 0.61–1.24)
GFR, Estimated: >60 mL/min (ref >60)
Glucose, Bld: 112 mg/dL — ABNORMAL HIGH (ref 70–99)
Potassium: 4 mmol/L (ref 3.5–5.1)
Sodium: 141 mmol/L (ref 135–145)

## 2022-02-01 LAB — TROPONIN I (HIGH SENSITIVITY): Troponin I (High Sensitivity): 6 ng/L (ref <18)

## 2022-02-01 NOTE — ED Triage Notes (Signed)
Pt here from home via [facility] for centeralized cp x1.5 hours that is nonradiating. Pt also c/o of migraine. Pt states cp is worse w/ movement, inspiration, and to palpation. Pt has pacemaker and was given 324mg  ASA. 124/86, 78HR, 100% RA

## 2022-02-01 NOTE — ED Provider Triage Note (Signed)
Emergency Medicine Provider Triage Evaluation Note  Kristopher Scott , a 66 y.o. male  was evaluated in triage.  Pt complains of left-sided chest tightness rated 8 out of 10 in severity.  Symptoms began approximately 1 hour ago.  Patient also complains of associated shortness of breath.  Patient told EMS that the pain was worse with movement and with palpation but during my assessment patient denied any worsening with palpation or movement.  Patient does have a pacemaker.  He was administered 3 to 24 mg of aspirin by EMS.  Review of Systems  Positive: As above Negative: As above  Physical Exam  BP 127/83 (BP Location: Right Arm)   Pulse 74   Temp 97.7 F (36.5 C) (Oral)   Resp 19   SpO2 99%  Gen:   Awake, no distress   Resp:  Normal effort  MSK:   Moves extremities without difficulty  Other:    Medical Decision Making  Medically screening exam initiated at 10:01 PM.  Appropriate orders placed.  TAJEE SAVANT was informed that the remainder of the evaluation will be completed by another provider, this initial triage assessment does not replace that evaluation, and the importance of remaining in the ED until their evaluation is complete.     Dorothyann Peng, PA-C 02/01/22 2204

## 2022-02-02 LAB — TROPONIN I (HIGH SENSITIVITY): Troponin I (High Sensitivity): 6 ng/L (ref <18)

## 2022-02-02 NOTE — ED Notes (Signed)
Pt left due to not being seen quick enough 

## 2022-10-15 ENCOUNTER — Other Ambulatory Visit (HOSPITAL_COMMUNITY): Payer: Medicare HMO

## 2022-10-15 ENCOUNTER — Other Ambulatory Visit: Payer: Self-pay

## 2022-10-15 ENCOUNTER — Observation Stay (HOSPITAL_COMMUNITY)
Admission: EM | Admit: 2022-10-15 | Discharge: 2022-10-17 | Disposition: A | Discharge status: Home Health | Payer: No Typology Code available for payment source | Attending: Emergency Medicine | Admitting: Emergency Medicine

## 2022-10-15 ENCOUNTER — Emergency Department (HOSPITAL_COMMUNITY): Payer: No Typology Code available for payment source

## 2022-10-15 ENCOUNTER — Encounter (HOSPITAL_COMMUNITY): Payer: Self-pay | Admitting: Family Medicine

## 2022-10-15 DIAGNOSIS — E119 Type 2 diabetes mellitus without complications: Secondary | ICD-10-CM

## 2022-10-15 DIAGNOSIS — R944 Abnormal results of kidney function studies: Secondary | ICD-10-CM | POA: Insufficient documentation

## 2022-10-15 DIAGNOSIS — R4182 Altered mental status, unspecified: Secondary | ICD-10-CM | POA: Insufficient documentation

## 2022-10-15 DIAGNOSIS — I110 Hypertensive heart disease with heart failure: Secondary | ICD-10-CM | POA: Diagnosis not present

## 2022-10-15 DIAGNOSIS — Z95810 Presence of automatic (implantable) cardiac defibrillator: Secondary | ICD-10-CM | POA: Insufficient documentation

## 2022-10-15 DIAGNOSIS — Z7901 Long term (current) use of anticoagulants: Secondary | ICD-10-CM | POA: Diagnosis not present

## 2022-10-15 DIAGNOSIS — R55 Syncope and collapse: Principal | ICD-10-CM

## 2022-10-15 DIAGNOSIS — R7989 Other specified abnormal findings of blood chemistry: Secondary | ICD-10-CM

## 2022-10-15 DIAGNOSIS — I5022 Chronic systolic (congestive) heart failure: Secondary | ICD-10-CM | POA: Diagnosis not present

## 2022-10-15 DIAGNOSIS — I5020 Unspecified systolic (congestive) heart failure: Secondary | ICD-10-CM | POA: Insufficient documentation

## 2022-10-15 DIAGNOSIS — I10 Essential (primary) hypertension: Secondary | ICD-10-CM | POA: Insufficient documentation

## 2022-10-15 DIAGNOSIS — I4891 Unspecified atrial fibrillation: Secondary | ICD-10-CM | POA: Diagnosis not present

## 2022-10-15 DIAGNOSIS — Z79899 Other long term (current) drug therapy: Secondary | ICD-10-CM | POA: Insufficient documentation

## 2022-10-15 LAB — COMPREHENSIVE METABOLIC PANEL
ALT: 18 U/L (ref 0–44)
AST: 19 U/L (ref 15–41)
Albumin: 3.5 g/dL (ref 3.5–5.0)
Alkaline Phosphatase: 43 U/L (ref 38–126)
Anion gap: 13 (ref 5–15)
BUN: 15 mg/dL (ref 8–23)
CO2: 19 mmol/L — ABNORMAL LOW (ref 22–32)
Calcium: 8.9 mg/dL (ref 8.9–10.3)
Chloride: 107 mmol/L (ref 98–111)
Creatinine, Ser: 1.51 mg/dL — ABNORMAL HIGH (ref 0.61–1.24)
GFR, Estimated: 51 mL/min — ABNORMAL LOW (ref >60)
Glucose, Bld: 150 mg/dL — ABNORMAL HIGH (ref 70–99)
Potassium: 3.2 mmol/L — ABNORMAL LOW (ref 3.5–5.1)
Sodium: 139 mmol/L (ref 135–145)
Total Bilirubin: 0.8 mg/dL (ref 0.3–1.2)
Total Protein: 6.3 g/dL — ABNORMAL LOW (ref 6.5–8.1)

## 2022-10-15 LAB — URINALYSIS, ROUTINE W REFLEX MICROSCOPIC
Bacteria, UA: NONE SEEN
Bilirubin Urine: NEGATIVE
Glucose, UA: >=500 mg/dL — AB
Hgb urine dipstick: NEGATIVE
Ketones, ur: NEGATIVE mg/dL
Leukocytes,Ua: NEGATIVE
Nitrite: NEGATIVE
Protein, ur: NEGATIVE mg/dL
Specific Gravity, Urine: 1.023 (ref 1.005–1.030)
pH: 5 (ref 5.0–8.0)

## 2022-10-15 LAB — CBC WITH DIFFERENTIAL/PLATELET
Abs Immature Granulocytes: 0.01 10*3/uL (ref 0.00–0.07)
Basophils Absolute: 0 10*3/uL (ref 0.0–0.1)
Basophils Relative: 1 %
Eosinophils Absolute: 0.1 10*3/uL (ref 0.0–0.5)
Eosinophils Relative: 3 %
HCT: 46.7 % (ref 39.0–52.0)
Hemoglobin: 15.2 g/dL (ref 13.0–17.0)
Immature Granulocytes: 0 %
Lymphocytes Relative: 43 %
Lymphs Abs: 1.7 10*3/uL (ref 0.7–4.0)
MCH: 28.4 pg (ref 26.0–34.0)
MCHC: 32.5 g/dL (ref 30.0–36.0)
MCV: 87.3 fL (ref 80.0–100.0)
Monocytes Absolute: 0.4 10*3/uL (ref 0.1–1.0)
Monocytes Relative: 11 %
Neutro Abs: 1.7 10*3/uL (ref 1.7–7.7)
Neutrophils Relative %: 42 %
Platelets: 206 10*3/uL (ref 150–400)
RBC: 5.35 MIL/uL (ref 4.22–5.81)
RDW: 16 % — ABNORMAL HIGH (ref 11.5–15.5)
WBC: 4 10*3/uL (ref 4.0–10.5)
nRBC: 0 % (ref 0.0–0.2)

## 2022-10-15 LAB — CBG MONITORING, ED: Glucose-Capillary: 151 mg/dL — ABNORMAL HIGH (ref 70–99)

## 2022-10-15 LAB — RAPID URINE DRUG SCREEN, HOSP PERFORMED
Amphetamines: NOT DETECTED
Barbiturates: NOT DETECTED
Benzodiazepines: NOT DETECTED
Cocaine: NOT DETECTED
Opiates: NOT DETECTED
Tetrahydrocannabinol: NOT DETECTED

## 2022-10-15 LAB — TROPONIN I (HIGH SENSITIVITY)
Troponin I (High Sensitivity): 6 ng/L (ref <18)
Troponin I (High Sensitivity): 6 ng/L (ref <18)

## 2022-10-15 LAB — MAGNESIUM: Magnesium: 2 mg/dL (ref 1.7–2.4)

## 2022-10-15 LAB — ETHANOL: Alcohol, Ethyl (B): 12 mg/dL — ABNORMAL HIGH (ref <10)

## 2022-10-15 MED ORDER — CARVEDILOL 3.125 MG PO TABS
3.1250 mg | ORAL_TABLET | Freq: Two times a day (BID) | ORAL | Status: DC
  Administered 2022-10-16 – 2022-10-17 (×3): 3.125 mg via ORAL
  Filled 2022-10-15 (×3): qty 1

## 2022-10-15 MED ORDER — INSULIN ASPART 100 UNIT/ML IJ SOLN
0.0000 [IU] | Freq: Three times a day (TID) | INTRAMUSCULAR | Status: DC
  Administered 2022-10-16: 3 [IU] via SUBCUTANEOUS

## 2022-10-15 MED ORDER — MORPHINE SULFATE (PF) 4 MG/ML IV SOLN: 4.0000 mg | Freq: Once | INTRAVENOUS | Status: DC

## 2022-10-15 MED ORDER — MORPHINE SULFATE (PF) 4 MG/ML IV SOLN
4.0000 mg | Freq: Once | INTRAVENOUS | Status: AC
  Administered 2022-10-15: 4 mg via INTRAVENOUS
  Filled 2022-10-15: qty 1

## 2022-10-15 MED ORDER — ACETAMINOPHEN 500 MG PO TABS
1000.0000 mg | ORAL_TABLET | Freq: Once | ORAL | Status: AC
  Administered 2022-10-15: 1000 mg via ORAL
  Filled 2022-10-15: qty 2

## 2022-10-15 MED ORDER — ATORVASTATIN CALCIUM 40 MG PO TABS: 40.0000 mg | ORAL_TABLET | Freq: Every day | ORAL | Status: DC

## 2022-10-15 MED ORDER — APIXABAN 5 MG PO TABS
5.0000 mg | ORAL_TABLET | Freq: Two times a day (BID) | ORAL | Status: DC
  Administered 2022-10-16 – 2022-10-17 (×4): 5 mg via ORAL
  Filled 2022-10-15 (×4): qty 1

## 2022-10-15 MED ORDER — ISOSORBIDE MONONITRATE ER 60 MG PO TB24
60.0000 mg | ORAL_TABLET | Freq: Every day | ORAL | Status: DC
  Administered 2022-10-16 – 2022-10-17 (×2): 60 mg via ORAL
  Filled 2022-10-15 (×2): qty 1

## 2022-10-15 MED ORDER — PROCHLORPERAZINE EDISYLATE 10 MG/2ML IJ SOLN: 10.0000 mg | Freq: Once | INTRAMUSCULAR | Status: DC

## 2022-10-15 MED ORDER — IOHEXOL 350 MG/ML SOLN
75.0000 mL | Freq: Once | INTRAVENOUS | Status: AC | PRN
  Administered 2022-10-15: 75 mL via INTRAVENOUS

## 2022-10-15 MED ORDER — SODIUM CHLORIDE 0.9 % IV BOLUS
500.0000 mL | Freq: Once | INTRAVENOUS | Status: AC
  Administered 2022-10-15: 500 mL via INTRAVENOUS

## 2022-10-15 MED ORDER — ONDANSETRON HCL 4 MG/2ML IJ SOLN
4.0000 mg | Freq: Once | INTRAMUSCULAR | Status: AC
  Administered 2022-10-15: 4 mg via INTRAVENOUS
  Filled 2022-10-15: qty 2

## 2022-10-15 MED ORDER — POTASSIUM CHLORIDE CRYS ER 20 MEQ PO TBCR
30.0000 meq | EXTENDED_RELEASE_TABLET | Freq: Once | ORAL | Status: AC
  Administered 2022-10-15: 30 meq via ORAL
  Filled 2022-10-15: qty 1

## 2022-10-15 MED ORDER — ALLOPURINOL 100 MG PO TABS
300.0000 mg | ORAL_TABLET | Freq: Every day | ORAL | Status: DC
  Administered 2022-10-16 – 2022-10-17 (×2): 300 mg via ORAL
  Filled 2022-10-15 (×2): qty 3

## 2022-10-15 MED ORDER — DIPHENHYDRAMINE HCL 50 MG/ML IJ SOLN: 12.5000 mg | Freq: Once | INTRAMUSCULAR | Status: DC

## 2022-10-15 MED ORDER — ONDANSETRON HCL 4 MG/2ML IJ SOLN: 4.0000 mg | Freq: Once | INTRAMUSCULAR | Status: DC

## 2022-10-15 NOTE — ED Notes (Signed)
[employer] representative to send over report for EDP

## 2022-10-15 NOTE — ED Notes (Signed)
MD Jearld Fenton notified and aware of right sided chest pain.  PA Robinson at bedside at this time.

## 2022-10-15 NOTE — Hospital Course (Addendum)
Kristopher Scott is a 67 y.o.male with a history of afib, HFrEF, pacemaker placement, GERD, HLD, gout, arthritis, T2DM, headaches who was admitted to the [facility] at [facility] for syncopal episode. His hospital course is detailed below:  Syncope Presented after episode of syncope with temporary word finding difficulty/AMS, workup overall was unrevealing.  Possibly vasovagal versus medication induced versus heat stroke versus TIA versus atypical seizure versus psychogenic.  CT head unremarkable, EEG unremarkable, MRI brain with possible chronic microangiopathy but no acute abnormality.  Pacemaker interrogated, not found to have any events.  Echo showed stable EF compared to prior although he was noted to have dilation of ascending aorta on CTA/echo. Cardiology was consulted, changed his Lasix to as needed and other changes as below. Discussed case with on-call neurologist Dr. Derry Lory, felt that this was unlikely to be TIA and since he is already on Eliquis, no need for ASA/Plavix.  May consider outpatient neurology follow-up for long-term EEG but this was not performed during admission as the patient did not have repeat episodes.  Upon discharge his word finding difficulty resolved and he was at his baseline mental status.   CHF Seen by cardiology due to his history of CHF and pacemaker placement.  Echo with stable EF 40 to 45%.  Patient was continued on carvedilol 3.125 mg twice daily, home Imdur 60 mg daily, Entresto.  Resumed empagliflozin/metformin combination and eplerenone on discharge.  Continued Coreg and Eliquis for A-fib.   Other chronic conditions were medically managed with home medications and formulary alternatives as necessary  PCP Follow-up Recommendations:  Issues for follow up Patient will need to be free of syncope x 6 months before driving per [place] DMV recommendations Medication changes: Lasix changed to as needed, Coreg 3.125 twice daily Incidental 9mm left parotid  nodule noted on MR Brain - Recommend ENT f/u outpatient Thoracic aortic aneurysm noted on CTA (5.3cm) - recommend follow up with cardiothoracic surgery Consider outpatient referral to neurology for further evaluation of recurrent syncopal episodes F/u TSH, B12 panel   Imaging findings:  CT Head WO contrast IMPRESSION: No acute intracranial abnormality.   DG Chest  IMPRESSION: There are no new infiltrates or signs of pulmonary edema.  CTA PE IMPRESSION: 1.  No evidence of pulmonary embolism. 2. Similar ascending aortic dilatation at maximally 5.3 cm. Recommend semi-annual imaging followup by CTA or MRA and referral to cardiothoracic surgery if not already obtained. This recommendation follows 2010 ACCF/AHA/AATS/ACR/ASA/SCA/SCAI/SIR/STS/SVM Guidelines for the Diagnosis and Management of Patients With Thoracic Aortic Disease. Circulation. 2010; 121: E266-e369TAA. Aortic aneurysm NOS (ICD10-I71.9) 3. Pulmonary artery enlargement suggests pulmonary arterial hypertension.  MR Brain IMPRESSION: 1. No acute intracranial abnormality. 2. Small amount of nonspecific T2 hyperintense lesions of the white matter, may represent early chronic microangiopathy. 3. A 9 mm left parotid nodule.  Non urgent ENT consult suggested.   EEG  IMPRESSION: This study is within normal limits. No seizures or epileptiform discharges were seen throughout the recording.   A normal interictal EEG does not exclude the diagnosis of epilepsy.

## 2022-10-15 NOTE — Progress Notes (Signed)
Waiting for patient to move to floor.

## 2022-10-15 NOTE — Assessment & Plan Note (Addendum)
HFrEF 40-45% s/p pacemaker implantation on 07/2022. Appears euvolemic on exam. No events on pacemaker interrogation. Evidence of pulmonary artery dilation on CTA PE.  Repeat echo with stable EF, G1 DD, mild aortic regurgitation, dilated aortic root. -Cardiology consulted, recs appreciated -Continue home Carvedilol -Hold home Lasix -Restart Entresto  -Continue statin

## 2022-10-15 NOTE — Assessment & Plan Note (Addendum)
A1c 6.5.  Home regimen includes Jardiance-metformin, Ozempic. -mSSI, CBGs

## 2022-10-15 NOTE — ED Notes (Signed)
ED TO INPATIENT HANDOFF REPORT  ED Nurse Name and Phone #: Alycia Rossetti [phone]  S Name/Age/Gender Kristopher Scott 67 y.o. male Room/Bed: 009C/009C  Code Status   Code Status: Full Code  Home/SNF/Other Home Patient oriented to: self, place, time, and situation Is this baseline? Yes   Triage Complete: Triage complete  Chief Complaint AMS (altered mental status) [R41.82]  Triage Note Pt BIB [facility] from home due to becoming unresponsive for 10 minutes.  Pt has been lethargic and slow to respond.  Pt does does not remember "passing out".  Pt does have left arm sided weakness and less sensation since pacemaker put in on that side. Pt states that the defibrillator has not fired off.  EMS gave 350 ml NS en route. VS BP 110/70, HR 80, SpO2 97% CBG 95   Allergies Allergies  Allergen Reactions   Spironolactone Other (See Comments)    Gynecomastia   Lisinopril Cough   Propranolol Itching    Level of Care/Admitting Diagnosis ED Disposition     ED Disposition  Admit   Condition  --   Comment  Hospital Area: [facility] [[id number]]  Level of Care: Progressive [102]  Admit to Progressive based on following criteria: NEUROLOGICAL AND NEUROSURGICAL complex patients with significant risk of instability, who do not meet ICU criteria, yet require close observation or frequent assessment (< / = every 2 - 4 hours) with medical / nursing intervention.  May place patient in observation at [facility] or [facility] if equivalent level of care is available:: No  Covid Evaluation: Asymptomatic - no recent exposure (last 10 days) testing not required  Diagnosis: AMS (altered mental status) [[id number]]  Admitting Physician: Celine Mans [[id number]]  Attending Physician: Billey Co [[id number]]          B Medical/Surgery History Past Medical History:  Diagnosis Date   Anxiety    Arthritis    Atrial fibrillation (HCC)    Back pain    ESI/TENS/therapy   Cardiac  defibrillator in place 12/2014   Cluster headaches    Congestive heart failure (CHF) (HCC)    Depression    Diabetes mellitus without complication (HCC)    GERD (gastroesophageal reflux disease)    Hyperlipidemia    Hypertension    Pinched vertebral nerve    pain/burning from left thigh to left foot at times   Sleep apnea    Past Surgical History:  Procedure Laterality Date   CARDIAC DEFIBRILLATOR PLACEMENT     HYDROCELE EXCISION / REPAIR     KNEE SURGERY Left    ROTATOR CUFF REPAIR Bilateral      A IV Location/Drains/Wounds Patient Lines/Drains/Airways Status     Active Line/Drains/Airways     Name Placement date Placement time Site Days   Peripheral IV 10/15/22 18 G Left Antecubital 10/15/22  1439  Antecubital  less than 1   Peripheral IV 10/15/22 18 G Right Antecubital 10/15/22  1459  Antecubital  less than 1            Intake/Output Last 24 hours No intake or output data in the 24 hours ending 10/15/22 2220  Labs/Imaging Results for orders placed or performed during the hospital encounter of 10/15/22 (from the past 48 hour(s))  CBG monitoring, ED     Status: Abnormal   Collection Time: 10/15/22  2:59 PM  Result Value Ref Range   Glucose-Capillary 151 (H) 70 - 99 mg/dL    Comment: Glucose reference range applies only to samples  taken after fasting for at least 8 hours.  CBC with Differential     Status: Abnormal   Collection Time: 10/15/22  3:02 PM  Result Value Ref Range   WBC 4.0 4.0 - 10.5 K/uL   RBC 5.35 4.22 - 5.81 MIL/uL   Hemoglobin 15.2 13.0 - 17.0 g/dL   HCT 96.0 45.4 - 09.8 %   MCV 87.3 80.0 - 100.0 fL   MCH 28.4 26.0 - 34.0 pg   MCHC 32.5 30.0 - 36.0 g/dL   RDW 11.9 (H) 14.7 - 82.9 %   Platelets 206 150 - 400 K/uL   nRBC 0.0 0.0 - 0.2 %   Neutrophils Relative % 42 %   Neutro Abs 1.7 1.7 - 7.7 K/uL   Lymphocytes Relative 43 %   Lymphs Abs 1.7 0.7 - 4.0 K/uL   Monocytes Relative 11 %   Monocytes Absolute 0.4 0.1 - 1.0 K/uL   Eosinophils  Relative 3 %   Eosinophils Absolute 0.1 0.0 - 0.5 K/uL   Basophils Relative 1 %   Basophils Absolute 0.0 0.0 - 0.1 K/uL   Immature Granulocytes 0 %   Abs Immature Granulocytes 0.01 0.00 - 0.07 K/uL    Comment: Performed at [facility], [address], [place], [place] [address]  Comprehensive metabolic panel     Status: Abnormal   Collection Time: 10/15/22  3:02 PM  Result Value Ref Range   Sodium 139 135 - 145 mmol/L   Potassium 3.2 (L) 3.5 - 5.1 mmol/L   Chloride 107 98 - 111 mmol/L   CO2 19 (L) 22 - 32 mmol/L   Glucose, Bld 150 (H) 70 - 99 mg/dL    Comment: Glucose reference range applies only to samples taken after fasting for at least 8 hours.   BUN 15 8 - 23 mg/dL   Creatinine, Ser 0.86 (H) 0.61 - 1.24 mg/dL   Calcium 8.9 8.9 - 57.8 mg/dL   Total Protein 6.3 (L) 6.5 - 8.1 g/dL   Albumin 3.5 3.5 - 5.0 g/dL   AST 19 15 - 41 U/L   ALT 18 0 - 44 U/L   Alkaline Phosphatase 43 38 - 126 U/L   Total Bilirubin 0.8 0.3 - 1.2 mg/dL   GFR, Estimated 51 (L) >60 mL/min    Comment: (NOTE) Calculated using the CKD-EPI Creatinine Equation (2021)    Anion gap 13 5 - 15    Comment: Performed at [facility], [address], [place], [place] [address]  Troponin I (High Sensitivity)     Status: None   Collection Time: 10/15/22  3:02 PM  Result Value Ref Range   Troponin I (High Sensitivity) 6 <18 ng/L    Comment: (NOTE) Elevated high sensitivity troponin I (hsTnI) values and significant  changes across serial measurements may suggest ACS but many other  chronic and acute conditions are known to elevate hsTnI results.  Refer to the "Links" section for chest pain algorithms and additional  guidance. Performed at [facility], [address] [place], [place] [address]   Ethanol     Status: Abnormal   Collection Time: 10/15/22  3:02 PM  Result Value Ref Range   Alcohol, Ethyl (B) 12 (H) <10 mg/dL    Comment: (NOTE) Lowest detectable limit for serum alcohol is 10  mg/dL.  For medical purposes only. Performed at [facility], [address] [place], [place] [address]   Magnesium     Status:  None   Collection Time: 10/15/22  3:02 PM  Result Value Ref Range   Magnesium 2.0 1.7 - 2.4 mg/dL    Comment: Performed at [facility], [address], [place], [place] [address]  Troponin I (High Sensitivity)     Status: None   Collection Time: 10/15/22  5:03 PM  Result Value Ref Range   Troponin I (High Sensitivity) 6 <18 ng/L    Comment: (NOTE) Elevated high sensitivity troponin I (hsTnI) values and significant  changes across serial measurements may suggest ACS but many other  chronic and acute conditions are known to elevate hsTnI results.  Refer to the "Links" section for chest pain algorithms and additional  guidance. Performed at [facility], [address] [place], [place] [address]   Rapid urine drug screen (hospital performed)     Status: None   Collection Time: 10/15/22  5:39 PM  Result Value Ref Range   Opiates NONE DETECTED NONE DETECTED   Cocaine NONE DETECTED NONE DETECTED   Benzodiazepines NONE DETECTED NONE DETECTED   Amphetamines NONE DETECTED NONE DETECTED   Tetrahydrocannabinol NONE DETECTED NONE DETECTED   Barbiturates NONE DETECTED NONE DETECTED    Comment: (NOTE) DRUG SCREEN FOR MEDICAL PURPOSES ONLY.  IF CONFIRMATION IS NEEDED FOR ANY PURPOSE, NOTIFY LAB WITHIN 5 DAYS.  LOWEST DETECTABLE LIMITS FOR URINE DRUG SCREEN Drug Class                     Cutoff (ng/mL) Amphetamine and metabolites    1000 Barbiturate and metabolites    200 Benzodiazepine                 200 Opiates and metabolites        300 Cocaine and metabolites        300 THC                            50 Performed at [facility], [address], [place], [place] [address]   Urinalysis, Routine w reflex microscopic -Urine, Clean Catch     Status: Abnormal   Collection Time: 10/15/22  5:39 PM  Result Value Ref Range    Color, Urine YELLOW YELLOW   APPearance CLEAR CLEAR   Specific Gravity, Urine 1.023 1.005 - 1.030   pH 5.0 5.0 - 8.0   Glucose, UA >=500 (A) NEGATIVE mg/dL   Hgb urine dipstick NEGATIVE NEGATIVE   Bilirubin Urine NEGATIVE NEGATIVE   Ketones, ur NEGATIVE NEGATIVE mg/dL   Protein, ur NEGATIVE NEGATIVE mg/dL   Nitrite NEGATIVE NEGATIVE   Leukocytes,Ua NEGATIVE NEGATIVE   RBC / HPF 0-5 0 - 5 RBC/hpf   WBC, UA 0-5 0 - 5 WBC/hpf   Bacteria, UA NONE SEEN NONE SEEN   Squamous Epithelial / HPF 0-5 0 - 5 /HPF    Comment: Performed at [facility], [address], [place], [place] [address]   CT Angio Chest PE W/Cm &/Or Wo Cm  Result Date: 10/15/2022 CLINICAL DATA:  Pulmonary embolism suspected. Altered mental status. EXAM: CT ANGIOGRAPHY CHEST WITH CONTRAST TECHNIQUE: Multidetector CT imaging of the chest was performed using the standard protocol during bolus administration of intravenous contrast. Multiplanar CT image reconstructions and MIPs were obtained to evaluate the vascular anatomy. RADIATION DOSE REDUCTION: This exam was performed according to the departmental dose-optimization program which includes automated exposure control, adjustment of the mA and/or kV according to  patient size and/or use of iterative reconstruction technique. CONTRAST:  75mL OMNIPAQUE IOHEXOL 350 MG/ML SOLN COMPARISON:  Chest radiograph of earlier today. CTA chest 01/01/2020 scratched that CT of the chest of 05/15/2022 from [facility] FINDINGS: Cardiovascular: The quality of this exam for evaluation of pulmonary embolism is good. No evidence of pulmonary embolism. Pacer/ICD. Aortic atherosclerosis. Borderline cardiomegaly, without pericardial effusion. Pulmonary artery enlargement, outflow tract 3.1 cm. Ascending aortic dilatation. 5.5 cm at the sinuses. 4.1 cm at the sinotubular junction. 5.2 cm in the mid ascending segment. Relatively similar to 05/15/2022 when remeasured. Bovine arch. Mediastinum/Nodes: No  mediastinal or hilar adenopathy. Lungs/Pleura: No pleural fluid. 3 mm right lower lobe pulmonary nodule is similar on the prior. Can be presumed benign and do/does not warrant imaging follow-up per Fleischner criteria. Upper Abdomen: Normal imaged portions of the liver, spleen, stomach, pancreas, adrenal glands, gallbladder. Incompletely imaged upper pole right renal 6.8 cm fluid density lesion is a cyst on prior abdominal CT . In the absence of clinically indicated signs/symptoms require(s) no independent follow-up. Musculoskeletal: Mild to moderate bilateral gynecomastia. Right rotator cuff repair. No acute osseous abnormality. Review of the MIP images confirms the above findings. IMPRESSION: 1.  No evidence of pulmonary embolism. 2. Similar ascending aortic dilatation at maximally 5.3 cm. Recommend semi-annual imaging followup by CTA or MRA and referral to cardiothoracic surgery if not already obtained. This recommendation follows 2010 ACCF/AHA/AATS/ACR/ASA/SCA/SCAI/SIR/STS/SVM Guidelines for the Diagnosis and Management of Patients With Thoracic Aortic Disease. Circulation. 2010; 121: E266-e369TAA. Aortic aneurysm NOS (ICD10-I71.9) 3. Pulmonary artery enlargement suggests pulmonary arterial hypertension. Electronically Signed   By: Jeronimo Greaves M.D.   On: 10/15/2022 17:09   DG Chest Port 1 View  Result Date: 10/15/2022 CLINICAL DATA:  Altered mental status EXAM: PORTABLE CHEST 1 VIEW COMPARISON:  02/01/2022 FINDINGS: Transverse diameter of heart is slightly increased. Thoracic aorta is ectatic. Lung fields are clear of any infiltrates or pulmonary edema. There is no pleural effusion or pneumothorax. Pacemaker/defibrillator battery is seen in left infraclavicular region. Biventricular pacer leads are noted in place. IMPRESSION: There are no new infiltrates or signs of pulmonary edema. Electronically Signed   By: Ernie Avena M.D.   On: 10/15/2022 15:50   CT Head Wo Contrast  Result Date:  10/15/2022 CLINICAL DATA:  Head trauma, abnormal mental status (Age 19-64y) EXAM: CT HEAD WITHOUT CONTRAST TECHNIQUE: Contiguous axial images were obtained from the base of the skull through the vertex without intravenous contrast. RADIATION DOSE REDUCTION: This exam was performed according to the departmental dose-optimization program which includes automated exposure control, adjustment of the mA and/or kV according to patient size and/or use of iterative reconstruction technique. COMPARISON:  CT Head 04/21/20 FINDINGS: Brain: No evidence of acute infarction, hemorrhage, hydrocephalus, extra-axial collection or mass lesion/mass effect. Sequela of moderate chronic microvascular ischemic change. Vascular: No hyperdense vessel or unexpected calcification. Skull: Normal. Negative for fracture or focal lesion. Sinuses/Orbits: No middle ear or mastoid effusion. Paranasal sinuses are clear. Orbits are unremarkable. Other: None. IMPRESSION: No acute intracranial abnormality. Electronically Signed   By: Lorenza Cambridge M.D.   On: 10/15/2022 15:38    Pending Labs Unresulted Labs (From admission, onward)     Start     Ordered   10/16/22 0500  Basic metabolic panel  Tomorrow morning,   R        10/15/22 2204   10/16/22 0500  CBC  Tomorrow morning,   R        10/15/22 2204  10/15/22 2147  HIV Antibody (routine testing w rflx)  (HIV Antibody (Routine testing w reflex) panel)  Once,   R        10/15/22 2204            Vitals/Pain Today's Vitals   10/15/22 1945 10/15/22 2030 10/15/22 2055 10/15/22 2130  BP: (!) 121/90 135/85  132/87  Pulse: 80 79  80  Resp: (!) 9 15  (!) 25  Temp:      TempSrc:      SpO2: 99% 100%  100%  Weight:      Height:      PainSc:   2      Isolation Precautions No active isolations  Medications Medications  allopurinol (ZYLOPRIM) tablet 300 mg (has no administration in time range)  atorvastatin (LIPITOR) tablet 40 mg (has no administration in time range)  carvedilol  (COREG) tablet 3.125 mg (has no administration in time range)  isosorbide mononitrate (IMDUR) 24 hr tablet 60 mg (has no administration in time range)  apixaban (ELIQUIS) tablet 5 mg (has no administration in time range)  iohexol (OMNIPAQUE) 350 MG/ML injection 75 mL (75 mLs Intravenous Contrast Given 10/15/22 1648)  sodium chloride 0.9 % bolus 500 mL (0 mLs Intravenous Stopped 10/15/22 2103)  potassium chloride (KLOR-CON M) CR tablet 30 mEq (30 mEq Oral Given 10/15/22 2001)  acetaminophen (TYLENOL) tablet 1,000 mg (1,000 mg Oral Given 10/15/22 2002)  ondansetron (ZOFRAN) injection 4 mg (4 mg Intravenous Given 10/15/22 2001)  morphine (PF) 4 MG/ML injection 4 mg (4 mg Intravenous Given 10/15/22 2013)    Mobility walks     Focused Assessments    R Recommendations: See Admitting Provider Note  Report given to:   Additional Notes: Very friendly patient. Alert, oriented, ambulatory.

## 2022-10-15 NOTE — H&P (Addendum)
Hospital Admission History and Physical Service Pager: [phone]  Patient name: Kristopher Scott Medical record number: [id number] Date of Birth: [birth year] Age: 67 y.o. Gender: male  Primary Care Provider: [facility] Consultants: Cardiology, Neurology Code Status: FULL Preferred Emergency Contact:  Contact Information     Name Relation Home Work [place] Spouse [phone]     Rish,Chris Brother   [phone]        Chief Complaint:   Assessment and Plan: Kristopher Scott is a 67 y.o. male presenting with syncope and AMS. Etiology is broad and differential's overlap. Includes drugs, medication overdose or side effects, stroke, electrolyte abnormalities, metabolic derangements, infection, trauma, seizures, arrhythmia, aortic stenosis, MI, PE.   Presentation is most consistent with combination of hypoperfusion due to dehydration, with concurrent sedating/orthostatic medications. Metabolic and electrolyte panel are suggestive of dehydration but otherwise reassuring. Patient also improved with IV hydration in ED. Cannot rule out cardiac causes given extensive history of congestive heart failure and arrhythmia requiring pacemaker; however, pacemaker interrogated in ED and was found to be normal, Troponin is reassuringly normal as well. Also cannot rule out TIA/stroke given patient risk factors and weakness on exam, but suspect weakness is chronic (pacemaker implantation, chronic back pain), and head CT was negative for acute stroke. Seizure is unlikely based on history. Possible component of gastroenteritis given history of diarrhea, but otherwise infectious work-up so far negative.  Active Hospital Problems   *Syncope           VSS. Reports prior episodes of syncope within past           year. Thus far work-up suggestive of           orthostasis/hyperperfusion (low potassium,           decreased CO2, Cr bumped, history of diarrhea).            Additionally has several home medications that may           cause hypotension or sedation. Reassuringly           patient has improved with fluid resuscitation.           Will continue to rule out acute structural cardiac           change, and neurologic causes see below. Suspect           possible TIA component given neurologic exam, and           history see below.-Admit to [facility]           Teaching Service, progressive, Attending Dr.            Miquel Dunn           -ECHO f/u           -Orthostatics AM           -Hold home medications: Lyrica, Klonazepam,           Jardiance, Prazosin, Lasix,            Robaxin, Norco           -Vitals per floor           -CRM           -Cardiology consulted, recs appreciated           -Fall precautions           -AM BMP, CBC    AMS (altered mental status)  Unclear baseline mental status. While alert and           orientated, patient had some difficulty with word           finding, and slow speech concerning for post-ictal           state vs CVA. UDS negative. CT Head negative.           Curbsided Neurology, Dr. Amada Jupiter who agrees           with further work-up.           - Neuro checks q4h           - MRI Brain           - EEG           - PT/OT           - SLP eval           - UA F/u    HFrEF (heart failure with reduced ejection fraction) (HCC)           HFrEF 40-45% s/p pacemaker implantation on           07/2022. Appears euvolemic on exam. No events on           pacemaker interrogation. Evidence of pulmonary           artery            dilation on CTA PE.            -Cardiology consulted, recs appreciated           -Continue home Carvedilol           -Hold home Lasix           -Restart Entresto tomorrow pending morning BMP           -Continue home Atorvastatin           -ECHO    T2DM (type 2 diabetes mellitus) (HCC)           Last A1c 7.5 in 2021 (patient has care at [facility]).           Glucose 150. -Hold home  Jardiance-Metformin combo           due to concern for orthostasis/lactic            acidosis           -Hold Ozempic           -mSSI           -CBG TID and at night           -add LAI as needed            -A1c    Elevated serum creatinine           Cr 1.51. Baseline appears ~1.3. Likely prerenal in           the setting of hypoperfusion due to dehydration.           K+ 3.2.           -AM BMP           -S/p Klor-Con   Resolved Hospital Problems No resolved problems to display.   Chronic Stable Conditions Atrial Fibrillation - HR paced, controlled, continue Eliquis, Carvedilol HTN - monitor BP, Continue Imdur, Carvedilol Gout - Continue Allopurinol PTSD - Hold Prazosin as above OSA - CPAP QHS HLD - Continue Atorvastatin  FEN/GI: Heart Healthy/Carb  Modified VTE Prophylaxis: Eliquis  Disposition: progressive  History of Present Illness:  Kristopher Scott is a 67 y.o. male presenting with syncope.  Threw up yesterday, today was more dry heaves but no vomiting. Yesterday and day prior had diarrhea, today a little better.  Not an unusual day for him, woke up and felt fine. Stated he went to go pick up some mufflers for his brother. He was sitting in the truck in the shade at the car shop and started to feel nauseous, dizzy and lightheaded. Got out and went to car shop where they had fans and felt better. Went to brother's house after that, after getting out sat down and passed out. He remembers waking up in hospital after that.   States this is the 4th time he has passed out has happened to him since in the last 3 years. Felt disoriented after waking back up-in and out. Does not feel like his speech or thoughts are affected now. States he has had "many" strokes in the past.  Felt like he lost complete motor function and had numbness on left arm after pacemaker was placed. Difficult for him to move his arm at that time. Feels like pacemaker pressing on nerve, states  there are plans to adjust pacemaker location.  Denies any alcohol use today.  In the ED, vital signs were stable. K+ was 3.2, Troponin 6, Creatinine 1.52, CO2 19. CT Head was normal. Patient complained of right sided chest pain. CTA PE showed no PE but evidence of pulmonary artery enlargement and stable aortic aneurysm. Cardiology was consulted, and pacemaker interrogated with arrhythmic results found. Patient was given zofran, compazine, and x1 dose of morphine.  Review Of Systems: Per HPI with the following additions: no chest pain, fever, or SOB.  Pertinent Past Medical History: Back and left knee arthritis Gout Afib  HFrEF s/p pacemaker GERD HLD T2DM - based on medication review and prior A1c Headaches - Got botox earlier this month  Remainder reviewed in history tab.   Pertinent Past Surgical History: Rotator cuff repair bilaterally Arthroscopy of left knee ICD placement 07/2022 2 hydroceles (left twice right once) Remainder reviewed in history tab.   Pertinent Social History: Tobacco use: Never Alcohol use: Socially 2.5-3 oz a week - had to cut back a lot due to "having issues" was wrecking havoc with some the medicines. Was previously drinking 12-36 ounces of beer previously over a 7 day period. Other Substance use: never Lives with wife and dog  Pertinent Family History: Mom-htn, CHF Sister-COPD Father-died of colon cancer Oldest brother-died of kidney failure  Remainder reviewed in history tab.   Important Outpatient Medications: Allopurinol 300 mg daily Elqiuis 5 mg BID- has missed 3 doses due to lab work at [facility] (Wednesday, Thursday and Friday) Atorvastatin 80 mg daily Carvedilol 3.125 BID Clonazepam 1 tablet daily Jardiance 10 mg daily, per chart review, combo pill with Metformin Entresto 24-26 BID Prilosec 20 mg daily Prazosin 5 mg - for PTSD Norco only as needed-for chronic  Imdur 60 mg daily Robaxin as needed Naproxen as needed Lyrica 25 mg  BID Ozempic 0.5 weekly for 4-5 months  Remainder reviewed in medication history.   Objective: BP 108/69 (BP Location: Right Arm)   Pulse 79   Temp 97.8 F (36.6 C) (Oral)   Resp 16   Ht 6\' 1"  (1.854 m)   Wt 115 kg   SpO2 97%   BMI 33.45 kg/m  Exam: General: A&O, NAD, lying comfortably in  hospital bed HEENT: No sign of trauma, EOM grossly intact, dry mucous membranes Cardiac: RRR, no m/r/g Respiratory: CTAB, normal WOB, no w/c/r GI: Soft, NTTP, non-distended, no rebound or guarding Extremities: NTTP, no peripheral edema. Neuro: Cranial nerves 2-12 intact. Alert and oriented x3. Slow speech, some word finding, strength decreased on left side. Moves all four extremities appropriately. Psych: Appropriate mood and affect   Labs:  CBC BMET  Recent Labs  Lab 10/15/22 1502  WBC 4.0  HGB 15.2  HCT 46.7  PLT 206   Recent Labs  Lab 10/15/22 1502  NA 139  K 3.2*  CL 107  CO2 19*  BUN 15  CREATININE 1.51*  GLUCOSE 150*  CALCIUM 8.9     Troponin 6. Ethanol 12. Magnesium 2.0. UA glucose >500 UDS negative.  EKG: Paced rhythm   Imaging Studies Performed:  CXR - No obvious disease, Pacemaker present  CT Head - No acute intracranial abnormality.   CTA PE 1.  No evidence of pulmonary embolism. 2. Similar ascending aortic dilatation at maximally 5.3 cm. Recommend semi-annual imaging followup by CTA or MRA and referral to cardiothoracic surgery if not already obtained. This recommendation follows 2010 ACCF/AHA/AATS/ACR/ASA/SCA/SCAI/SIR/STS/SVM Guidelines for the Diagnosis and Management of Patients With Thoracic Aortic Disease. Circulation. 2010; 121: [id number]. Aortic aneurysm NOS (ICD10-I71.9) 3. Pulmonary artery enlargement suggests pulmonary arterial hypertension.   Celine Mans, MD  10/16/2022, 12:39 AM PGY-1,  Family Medicine  FPTS Intern pager: [phone], text pages welcome Secure chat group [facility] Teaching  Service    Upper Level Addendum:  I have seen and evaluated this patient along with Dr. Velna Ochs and reviewed the above note, making necessary revisions as appropriate.  I agree with the medical decision making and physical exam as noted above.  Levin Erp, MD PGY-2 [facility] Family Medicine Residency

## 2022-10-15 NOTE — Assessment & Plan Note (Addendum)
Unclear baseline mental status. While alert and orientated, patient had some difficulty with word finding, and slow speech concerning for post-ictal state vs CVA. UDS negative. CT Head negative. Curbsided Neurology, Dr. Amada Jupiter who agrees with further work-up. - Neuro checks q4h - MRI Brain - EEG - PT/OT - SLP eval - UA F/u

## 2022-10-15 NOTE — Assessment & Plan Note (Addendum)
Unclear etiology, possibly vasovagal versus TIA.  Currently stable.  Orthostatic vital signs WNL.  EEG and CT head unremarkable.  Patient is fully alert and oriented but has some difficulty with word finding, although his baseline mental status is unclear. -Follow-up MRI -Continue telemetry -Attempt to obtain collateral information from family members

## 2022-10-15 NOTE — Assessment & Plan Note (Addendum)
Cr 1.51. Baseline appears ~1.3. Likely prerenal in the setting of hypoperfusion due to dehydration. K+ 3.2. -AM BMP -S/p Klor-Con

## 2022-10-15 NOTE — ED Notes (Signed)
[employer] pacemaker interrogated at [employer]

## 2022-10-15 NOTE — ED Triage Notes (Signed)
Pt BIB [facility] from home due to becoming unresponsive for 10 minutes.  Pt has been lethargic and slow to respond.  Pt does does not remember "passing out".  Pt does have left arm sided weakness and less sensation since pacemaker put in on that side. Pt states that the defibrillator has not fired off.  EMS gave 350 ml NS en route. VS BP 110/70, HR 80, SpO2 97% CBG 95

## 2022-10-15 NOTE — ED Provider Notes (Signed)
[place] EMERGENCY DEPARTMENT AT [facility] Provider Note   CSN: [id number] Arrival date & time: 10/15/22  1449     History  Chief Complaint  Patient presents with   Altered Mental Status   HPI Kristopher Scott is a 67 y.o. male with CHF s/p cardiac defibrillator in place, A-fib on Eliquis presenting for syncope.  Patient states that he was outside today hanging out with some family and all that he can remembers is that he was talking to his brother and then "woke up in the back of the ambulance".  States he did have a headache leading up to the event.  Denies shortness of breath, chest pain, arrhythmia or firing of his pacemaker.  States he has had a couple days of diarrhea and nausea.  Denies blood in the stool.  States he has had some loss of sensation and weakness in the left arm since pacemaker placement.  Patient states he is followed by cardiology at the [place].  Per his brother, Kristopher Scott, he was helping patient up the stairs when patient expressed that he was tired and wanted to sit down and step.  He then were able to assist him to chair when he was then slumped over with his head down and eyes closed.  Brother immediately called ambulance.  Brother denies any convulsions, tremors, eye deviation or body jerking.   Altered Mental Status      Home Medications Prior to Admission medications   Medication Sig Start Date End Date Taking? Authorizing Provider  allopurinol (ZYLOPRIM) 300 MG tablet Take 300 mg by mouth daily.    [provider]  Apixaban (ELIQUIS PO) Take 5 mg by mouth 2 (two) times daily.     [provider]  atorvastatin (LIPITOR) 80 MG tablet Take 40 mg by mouth daily.     [provider]  carvedilol (COREG) 6.25 MG tablet Take 3.125 mg by mouth 2 (two) times daily with a meal.    [provider]  CLONAZEPAM PO Take 1 tablet by mouth as needed.    [provider]  empagliflozin (JARDIANCE) 10 MG TABS tablet Take  1 tablet (10 mg total) by mouth daily. 01/08/20   Azalee Course, PA  erythromycin with ethanol (ERYGEL) 2 % gel Apply topically daily. To affected area in between toes of left foot 01/28/18   Park Liter, DPM  famotidine (PEPCID) 20 MG tablet Take 20 mg by mouth daily.    [provider]  furosemide (LASIX) 20 MG tablet Take 1 tablet (20 mg total) by mouth daily. 01/08/20   Azalee Course, PA  HYDROcodone-acetaminophen (NORCO/VICODIN) 5-325 MG tablet Take 1 tablet by mouth every 6 (six) hours as needed. 04/22/20   Ward, Layla Maw, DO  ibuprofen (ADVIL) 800 MG tablet Take 1 tablet (800 mg total) by mouth every 8 (eight) hours as needed for mild pain. 04/22/20   Ward, Layla Maw, DO  isosorbide mononitrate (IMDUR) 60 MG 24 hr tablet Take 60 mg by mouth daily.     [provider]  ketoconazole (NIZORAL) 2 % cream Apply 1 fingertip amount to bottom of each foot daily. 01/28/18   Park Liter, DPM  methocarbamol (ROBAXIN) 500 MG tablet Take 1 tablet (500 mg total) by mouth every 8 (eight) hours as needed for muscle spasms. 04/22/20   Ward, Layla Maw, DO  naproxen (NAPROSYN) 375 MG tablet Take 1 tablet (375 mg total) by mouth 2 (two) times daily. Patient taking differently: Take  375 mg by mouth 2 (two) times daily as needed for mild pain. 04/07/16   Felicie Morn, NP  nitroGLYCERIN (NITROSTAT) 0.4 MG SL tablet Place 0.4 mg under the tongue every 5 (five) minutes as needed for chest pain.     [provider]  omeprazole (PRILOSEC) 20 MG capsule Take 20 mg by mouth daily. Patient not taking: Reported on 04/20/2020    [provider]  prazosin (MINIPRESS) 5 MG capsule Take 5 mg by mouth at bedtime. 03/29/19   [provider]  pregabalin (LYRICA) 25 MG capsule Take 25 mg by mouth 2 (two) times daily.    [provider]  sacubitril-valsartan (ENTRESTO) 24-26 MG Take 1 tablet by mouth 2 (two) times daily. 01/07/20   Azalee Course, PA      Allergies    Spironolactone,  Lisinopril, and Propranolol    Review of Systems   See HPI for pertinent positives   Physical Exam   Vitals:   10/15/22 1945 10/15/22 2030  BP: (!) 121/90 135/85  Pulse: 80 79  Resp: (!) 9 15  Temp:    SpO2: 99% 100%    CONSTITUTIONAL:  well-appearing, NAD, sluggish NEURO:  GCS 15. Speech is goal oriented but slow to resond. No deficits appreciated to CN III-XII; symmetric eyebrow raise, no facial drooping, tongue midline. Sensation to light touch intact.  4/5 strength on left. 5/5 strength on right. Normal finger-nose-finger. Patient ambulatory with steady gait. EYES:  eyes equal and reactive ENT/NECK:  Supple, no stridor, dry mucous membranes CARDIO: Regular rate and rhythm, appears well-perfused  PULM:  No respiratory distress, CTAB GI/GU:  non-distended, soft, non tender MSK/SPINE:  No gross deformities, no edema, moves all extremities  SKIN:  no rash, atraumatic  *Additional and/or pertinent findings included in MDM below   ED Results / Procedures / Treatments   Labs (all labs ordered are listed, but only abnormal results are displayed) Labs Reviewed  CBC WITH DIFFERENTIAL/PLATELET - Abnormal; Notable for the following components:      Result Value   RDW 16.0 (*)    All other components within normal limits  COMPREHENSIVE METABOLIC PANEL - Abnormal; Notable for the following components:   Potassium 3.2 (*)    CO2 19 (*)    Glucose, Bld 150 (*)    Creatinine, Ser 1.51 (*)    Total Protein 6.3 (*)    GFR, Estimated 51 (*)    All other components within normal limits  ETHANOL - Abnormal; Notable for the following components:   Alcohol, Ethyl (B) 12 (*)    All other components within normal limits  URINALYSIS, ROUTINE W REFLEX MICROSCOPIC - Abnormal; Notable for the following components:   Glucose, UA >=500 (*)    All other components within normal limits  CBG MONITORING, ED - Abnormal; Notable for the following components:   Glucose-Capillary 151 (*)    All  other components within normal limits  RAPID URINE DRUG SCREEN, HOSP PERFORMED  MAGNESIUM  CBG MONITORING, ED  TROPONIN I (HIGH SENSITIVITY)  TROPONIN I (HIGH SENSITIVITY)    EKG EKG Interpretation  Date/Time:  Wednesday October 15 2022 16:14:12 EDT Ventricular Rate:  82 PR Interval:  216 QRS Duration: 156 QT Interval:  433 QTC Calculation: 506 R Axis:   -31 Text Interpretation: Atrial-sensed ventricular-paced complexes No further analysis attempted due to paced rhythm Confirmed by Coralee Pesa [phone]) on 10/15/2022 9:16:48 PM  Radiology CT Angio Chest PE W/Cm &/Or Wo Cm  Result Date: 10/15/2022 CLINICAL  DATA:  Pulmonary embolism suspected. Altered mental status. EXAM: CT ANGIOGRAPHY CHEST WITH CONTRAST TECHNIQUE: Multidetector CT imaging of the chest was performed using the standard protocol during bolus administration of intravenous contrast. Multiplanar CT image reconstructions and MIPs were obtained to evaluate the vascular anatomy. RADIATION DOSE REDUCTION: This exam was performed according to the departmental dose-optimization program which includes automated exposure control, adjustment of the mA and/or kV according to patient size and/or use of iterative reconstruction technique. CONTRAST:  75mL OMNIPAQUE IOHEXOL 350 MG/ML SOLN COMPARISON:  Chest radiograph of earlier today. CTA chest 01/01/2020 scratched that CT of the chest of 05/15/2022 from [facility] FINDINGS: Cardiovascular: The quality of this exam for evaluation of pulmonary embolism is good. No evidence of pulmonary embolism. Pacer/ICD. Aortic atherosclerosis. Borderline cardiomegaly, without pericardial effusion. Pulmonary artery enlargement, outflow tract 3.1 cm. Ascending aortic dilatation. 5.5 cm at the sinuses. 4.1 cm at the sinotubular junction. 5.2 cm in the mid ascending segment. Relatively similar to 05/15/2022 when remeasured. Bovine arch. Mediastinum/Nodes: No mediastinal or hilar adenopathy. Lungs/Pleura: No  pleural fluid. 3 mm right lower lobe pulmonary nodule is similar on the prior. Can be presumed benign and do/does not warrant imaging follow-up per Fleischner criteria. Upper Abdomen: Normal imaged portions of the liver, spleen, stomach, pancreas, adrenal glands, gallbladder. Incompletely imaged upper pole right renal 6.8 cm fluid density lesion is a cyst on prior abdominal CT . In the absence of clinically indicated signs/symptoms require(s) no independent follow-up. Musculoskeletal: Mild to moderate bilateral gynecomastia. Right rotator cuff repair. No acute osseous abnormality. Review of the MIP images confirms the above findings. IMPRESSION: 1.  No evidence of pulmonary embolism. 2. Similar ascending aortic dilatation at maximally 5.3 cm. Recommend semi-annual imaging followup by CTA or MRA and referral to cardiothoracic surgery if not already obtained. This recommendation follows 2010 ACCF/AHA/AATS/ACR/ASA/SCA/SCAI/SIR/STS/SVM Guidelines for the Diagnosis and Management of Patients With Thoracic Aortic Disease. Circulation. 2010; 121: E266-e369TAA. Aortic aneurysm NOS (ICD10-I71.9) 3. Pulmonary artery enlargement suggests pulmonary arterial hypertension. Electronically Signed   By: Jeronimo Greaves M.D.   On: 10/15/2022 17:09   DG Chest Port 1 View  Result Date: 10/15/2022 CLINICAL DATA:  Altered mental status EXAM: PORTABLE CHEST 1 VIEW COMPARISON:  02/01/2022 FINDINGS: Transverse diameter of heart is slightly increased. Thoracic aorta is ectatic. Lung fields are clear of any infiltrates or pulmonary edema. There is no pleural effusion or pneumothorax. Pacemaker/defibrillator battery is seen in left infraclavicular region. Biventricular pacer leads are noted in place. IMPRESSION: There are no new infiltrates or signs of pulmonary edema. Electronically Signed   By: Ernie Avena M.D.   On: 10/15/2022 15:50   CT Head Wo Contrast  Result Date: 10/15/2022 CLINICAL DATA:  Head trauma, abnormal mental  status (Age 28-64y) EXAM: CT HEAD WITHOUT CONTRAST TECHNIQUE: Contiguous axial images were obtained from the base of the skull through the vertex without intravenous contrast. RADIATION DOSE REDUCTION: This exam was performed according to the departmental dose-optimization program which includes automated exposure control, adjustment of the mA and/or kV according to patient size and/or use of iterative reconstruction technique. COMPARISON:  CT Head 04/21/20 FINDINGS: Brain: No evidence of acute infarction, hemorrhage, hydrocephalus, extra-axial collection or mass lesion/mass effect. Sequela of moderate chronic microvascular ischemic change. Vascular: No hyperdense vessel or unexpected calcification. Skull: Normal. Negative for fracture or focal lesion. Sinuses/Orbits: No middle ear or mastoid effusion. Paranasal sinuses are clear. Orbits are unremarkable. Other: None. IMPRESSION: No acute intracranial abnormality. Electronically Signed   By: Lorenza Cambridge  M.D.   On: 10/15/2022 15:38    Procedures Procedures    Medications Ordered in ED Medications  iohexol (OMNIPAQUE) 350 MG/ML injection 75 mL (75 mLs Intravenous Contrast Given 10/15/22 1648)  sodium chloride 0.9 % bolus 500 mL (0 mLs Intravenous Stopped 10/15/22 2103)  potassium chloride (KLOR-CON M) CR tablet 30 mEq (30 mEq Oral Given 10/15/22 2001)  acetaminophen (TYLENOL) tablet 1,000 mg (1,000 mg Oral Given 10/15/22 2002)  ondansetron (ZOFRAN) injection 4 mg (4 mg Intravenous Given 10/15/22 2001)  morphine (PF) 4 MG/ML injection 4 mg (4 mg Intravenous Given 10/15/22 2013)    ED Course/ Medical Decision Making/ A&P Clinical Course as of 10/15/22 2124  Wed Oct 15, 2022  1618 Was called to the room by nurse who advised that patient now having chest pain.  Per patient, chest pain located in the right side of the chest and radiates towards the middle.  It hurts worse with breathing.  Feels achy.  O2 sats at this time was 100% on room air.  Notably more  tachypneic.  Ordered CTA of the chest to evaluate for possible PE. [JR]    Clinical Course User Index [JR] Gareth Eagle, PA-C                             Medical Decision Making Amount and/or Complexity of Data Reviewed Labs: ordered. Radiology: ordered.  Risk OTC drugs. Prescription drug management. Decision regarding hospitalization.   Initial Impression and Ddx 67 year old male who is well-appearing presenting for syncope.  Exam was notable for sluggish response to questions but otherwise reassuring and dry mucous membranes.  DDx includes arrhythmia, sepsis, seizure, ACS, PE, stroke, ICH, electrolyte derangement, hypovolemia Patient PMH that increases complexity of ED encounter:   CHF s/p cardiac defibrillator in place, A-fib on Eliquis  Interpretation of Diagnostics I independent reviewed and interpreted the labs as followed: Hypokalemia, elevated creatinine, glucosuria   - I independently visualized the following imaging with scope of interpretation limited to determining acute life threatening conditions related to emergency care: CT angio chest, which revealed pulmonary artery enlargement but no PE identified, and stable aortic dilation  -I personally reviewed and interpreted EKG which revealed atrially sensed and ventricularly paced  Interrogation of pacemaker revealed concern for possible "dry status" suggestive of hypovolemia  Patient Reassessment and Ultimate Disposition/Management Clinically appears well but there was some initial concern that he may be dehydrated.  Considered sepsis but unlikely given no fever, no leukocytosis and mentating well on arrival.  Considered arrhythmia but interrogation was relatively benign.  Patient had acute onset of chest pain during this encounter as well that precipitated CTA of his chest which was negative.  Details of his syncope were unclear largely, given how long it was stated that he was "unresponsive" there was concern that may  have been a seizure.  I talked to the brother but he denied any seizure-like activity.  Discussed patient with Dr. Amada Jupiter who thinks maybe his symptoms could be related to hypoperfusion in the setting of hypovolemia.  Also discussed patient with Dr. Juel Burrow of cardiology who advised to admit for observation but states that overall from a cardiac standpoint looks well but could be dehydrated as well.  Was somewhat concerned about the dilation of his pulmonary arteries and suggested possible echo in the morning.  Admitted to hospital service.   Patient management required discussion with the following services or consulting groups:  Hospitalist Service, Cardiology, and  Neurology  Complexity of Problems Addressed Acute complicated illness or Injury  Additional Data Reviewed and Analyzed Further history obtained from: Further history from spouse/family member, Past medical history and medications listed in the EMR, and Prior ED visit notes  Patient Encounter Risk Assessment Consideration of hospitalization         Final Clinical Impression(s) / ED Diagnoses Final diagnoses:  None    Rx / DC Orders ED Discharge Orders     None         Gareth Eagle, PA-C 10/15/22 2129    Loetta Rough, MD 10/16/22 [id number]    Loetta Rough, MD 10/16/22 [phone]

## 2022-10-15 NOTE — Consult Note (Addendum)
Cardiology Consultation   Patient ID: Kristopher Scott MRN: [id number]; DOB: 03-Mar-1956  Admit date: 10/15/2022 Date of Consult: 10/15/2022  PCP:  Clinic, [facility] Providers Cardiologist:  None        Patient Profile:   Kristopher Scott is a 67 y.o. male with a hx of HFrEF 40-45% s/p MDT CRT-D 07/2022, AF on apixaban, HTN, HLD, DM, who is being seen 10/15/2022 for the evaluation of syncope at the request of Dr. Jearld Fenton.  History of Present Illness:   Kristopher Scott is a 67 y.o. male with a hx of HFrEF 40-45% s/p MDT ICD, AF on apixaban, HTN, HLD, DM who is being seen 10/15/2022 for the evaluation of syncope at the request of Dr. Jearld Fenton.  He notes that he was with his brother for the majority of the day, they were picking up his car from the auto shop. While the mechanics were working on the car, he noted that he felt a little "off" and lightheaded. He notes that his brother thought he was slurring because he asked why he was talking a little funny. They didn't think much about it and brought the car home. He still felt lightheaded and started having a headache so asked to sit down so his brother helped him to the sofa to sit down. The next thing he remembers was waking up in the back of an ambulance. He denies any chest pain, palpitations, ICD shocks, or notable SOB prior to the event.  Per his brother, he closed his eyes and slumped over while on the sofa, unresponsive for ~10 minutes. No jerking, shaking, or tongue biting that they noted. He was tired after he woke up but no clear focal weakness that could be appreciated.  MDT CRT-D interrogated in the ED with no arrhythmias detected since his last interrogation 10/09/22 (VT monitor zone set >150bpm, notably no AF noted either). His thoracic impedance was above the patient's reference value suggesting possible dry status. Hs-trop 6 -> 6, Cr 1.5 (from baseline 1.3), CT PE negative for PE.  Followed by the [facility] for  his cardiology care, which we do not have access to here. He notes that he recently had his ICD upgraded to a CRT-D in 07/2022. States that it impinged on a nerve in his left chest that has caused some left arm weakness so they are hoping to do a revision in the coming months. He is on apixaban for atrial fibrillation, notes that he has been taking his apixaban as prescribed for the last 30 days with the exception of 2 missed doses on Friday 10/10/22 and 1 dose on Sunday 10/12/22 for [facility].  He notes that he has synopsized twice before, once in 2014 when he was at a baseball game (also with lightheadedness), and once more recently when he was working with PT (also with lightheadedness). He does note that he's been told he's had a "mini stroke" before but doesn't remember how that diagnosis was reached. Has never experienced an ICD shock since his original one was placed in 2014 (his LVEF was 20-25% back then per his report so presumably placed for primary prevention).  Past Medical History:  Diagnosis Date   Anxiety    Arthritis    Atrial fibrillation (HCC)    Back pain    ESI/TENS/therapy   Cardiac defibrillator in place 12/2014   Cluster headaches    Congestive heart failure (CHF) (HCC)  Depression    Diabetes mellitus without complication (HCC)    GERD (gastroesophageal reflux disease)    Hyperlipidemia    Hypertension    Pinched vertebral nerve    pain/burning from left thigh to left foot at times   Sleep apnea     Past Surgical History:  Procedure Laterality Date   CARDIAC DEFIBRILLATOR PLACEMENT     HYDROCELE EXCISION / REPAIR     KNEE SURGERY Left    ROTATOR CUFF REPAIR Bilateral      Home Medications:  Prior to Admission medications   Medication Sig Start Date End Date Taking? Authorizing Provider  allopurinol (ZYLOPRIM) 300 MG tablet Take 300 mg by mouth daily.    [provider]  Apixaban (ELIQUIS PO) Take 5 mg by mouth 2 (two) times  daily.     [provider]  atorvastatin (LIPITOR) 80 MG tablet Take 40 mg by mouth daily.     [provider]  carvedilol (COREG) 6.25 MG tablet Take 3.125 mg by mouth 2 (two) times daily with a meal.    [provider]  CLONAZEPAM PO Take 1 tablet by mouth as needed.    [provider]  empagliflozin (JARDIANCE) 10 MG TABS tablet Take 1 tablet (10 mg total) by mouth daily. 01/08/20   Azalee Course, PA  erythromycin with ethanol (ERYGEL) 2 % gel Apply topically daily. To affected area in between toes of left foot 01/28/18   Park Liter, DPM  famotidine (PEPCID) 20 MG tablet Take 20 mg by mouth daily.    [provider]  furosemide (LASIX) 20 MG tablet Take 1 tablet (20 mg total) by mouth daily. 01/08/20   Azalee Course, PA  HYDROcodone-acetaminophen (NORCO/VICODIN) 5-325 MG tablet Take 1 tablet by mouth every 6 (six) hours as needed. 04/22/20   Ward, Layla Maw, DO  ibuprofen (ADVIL) 800 MG tablet Take 1 tablet (800 mg total) by mouth every 8 (eight) hours as needed for mild pain. 04/22/20   Ward, Layla Maw, DO  isosorbide mononitrate (IMDUR) 60 MG 24 hr tablet Take 60 mg by mouth daily.     [provider]  ketoconazole (NIZORAL) 2 % cream Apply 1 fingertip amount to bottom of each foot daily. 01/28/18   Park Liter, DPM  methocarbamol (ROBAXIN) 500 MG tablet Take 1 tablet (500 mg total) by mouth every 8 (eight) hours as needed for muscle spasms. 04/22/20   Ward, Layla Maw, DO  naproxen (NAPROSYN) 375 MG tablet Take 1 tablet (375 mg total) by mouth 2 (two) times daily. Patient taking differently: Take 375 mg by mouth 2 (two) times daily as needed for mild pain. 04/07/16   Felicie Morn, NP  nitroGLYCERIN (NITROSTAT) 0.4 MG SL tablet Place 0.4 mg under the tongue every 5 (five) minutes as needed for chest pain.     [provider]  omeprazole (PRILOSEC) 20 MG capsule Take 20 mg by mouth daily. Patient not taking: Reported on 04/20/2020     [provider]  prazosin (MINIPRESS) 5 MG capsule Take 5 mg by mouth at bedtime. 03/29/19   [provider]  pregabalin (LYRICA) 25 MG capsule Take 25 mg by mouth 2 (two) times daily.    [provider]  sacubitril-valsartan (ENTRESTO) 24-26 MG Take 1 tablet by mouth 2 (two) times daily. 01/07/20   Azalee Course, PA    Inpatient Medications: Scheduled Meds:   Continuous Infusions:  sodium chloride     PRN Meds:   Allergies:  Allergies  Allergen Reactions   Spironolactone Other (See Comments)   Lisinopril Cough   Propranolol Itching    Social History:   Social History   Socioeconomic History   Marital status: Married    Spouse name: Not on file   Number of children: Not on file   Years of education: Not on file   Highest education level: Not on file  Occupational History   Not on file  Tobacco Use   Smoking status: Never   Smokeless tobacco: Never  Vaping Use   Vaping Use: Never used  Substance and Sexual Activity   Alcohol use: Yes    Comment: socially   Drug use: No   Sexual activity: Yes  Other Topics Concern   Not on file  Social History Narrative   Not on file   Social Determinants of Health   Financial Resource Strain: Not on file  Food Insecurity: Not on file  Transportation Needs: Not on file  Physical Activity: Not on file  Stress: Not on file  Social Connections: Not on file  Intimate Partner Violence: Not on file    Family History:   Family History  Problem Relation Age of Onset   Hypertension Mother    Diabetes Mother    Heart disease Mother    Colon cancer Father 15   Diabetes Brother    Pancreatic cancer Paternal Uncle    Liver disease Paternal Uncle    Esophageal cancer Neg Hx    Stomach cancer Neg Hx      ROS:  Please see the history of present illness.  All other ROS reviewed and negative.     Physical Exam/Data:   Vitals:   10/15/22 1900 10/15/22 1945 10/15/22 2030 10/15/22 2130  BP: 120/79 (!)  121/90 135/85 132/87  Pulse: 78 80 79 80  Resp: 18 (!) 9 15 (!) 25  Temp:      TempSrc:      SpO2: 100% 99% 100% 100%  Weight:      Height:       No intake or output data in the 24 hours ending 10/15/22 2142    10/15/2022    2:59 PM 03/01/2020    9:25 AM 01/07/2020    5:00 AM  Last 3 Weights  Weight (lbs) 253 lb 8.5 oz 253 lb 6.4 oz 255 lb 15.3 oz  Weight (kg) 115 kg 114.941 kg 116.1 kg     Body mass index is 33.45 kg/m.  General:  Laying flat in bed in NAD HEENT: normocephalic Neck: no elevation in JVP Cardiac:  normal S1, S2; RRR; no murmur, BiV paced on telemetry Lungs:  clear to auscultation bilaterally, no wheezing, rhonchi or rales  Abd: soft, nontender, no hepatomegaly  Ext: no edema Musculoskeletal:  No deformities, BUE and BLE strength normal and equal Skin: warm and dry  Neuro:  alert and awake, grossly oriented, normal FNF with RUE, LUE weakness that he notes is chronic Psych:  Normal affect   EKG:  The EKG was personally reviewed and demonstrates:  BiV pacing Telemetry:  Telemetry was personally reviewed and demonstrates:  BiV pacing  Relevant CV Studies:  [employer] Transmission Note 10/15/22: BiV ICD Programmed Mode: DDD, LRL 50bpm A-pacing 0.3%, V-pacing 97.1% Available daily battery/lead measurements within expected range. Lead trends stable.  No arrhythmias/high rate episodes detected, no therapies delivered since last interrogation on 6Jun2024.  Current thoracic impedance measurement above patient reference line suggesting possible dry status. See OptiVol/Thoracic Impedence data.  Device appears to be currently functioning as programmed.   TTE 01/03/20: 1. Left ventricular ejection fraction, by estimation, is 40 to 45%. The  left ventricle has mildly decreased function. The left ventricle  demonstrates global hypokinesis with mild septal-lateral dyssynchrony.  There is moderate left ventricular  hypertrophy. Left ventricular  diastolic parameters are consistent with  Grade I diastolic dysfunction (impaired relaxation).   2. Right ventricular systolic function is mildly reduced. The right  ventricular size is normal. There is normal pulmonary artery systolic  pressure. The estimated right ventricular systolic pressure is 20.8 mmHg.   3. The mitral valve is normal in structure. Trivial mitral valve  regurgitation. No evidence of mitral stenosis.   4. The aortic valve is tricuspid. Aortic valve regurgitation is mild to  moderate. No aortic stenosis is present.   5. Aortic dilatation noted. There is moderate dilatation of the ascending  aorta measuring 48 mm. Would consider dedicated imaging by CTA or MRA to  further assess.   6. The inferior vena cava is normal in size with greater than 50%  respiratory variability, suggesting right atrial pressure of 3 mmHg.   Laboratory Data:  High Sensitivity Troponin:   Recent Labs  Lab 10/15/22 1502 10/15/22 1703  TROPONINIHS 6 6     Chemistry Recent Labs  Lab 10/15/22 1502  NA 139  K 3.2*  CL 107  CO2 19*  GLUCOSE 150*  BUN 15  CREATININE 1.51*  CALCIUM 8.9  MG 2.0  GFRNONAA 51*  ANIONGAP 13    Recent Labs  Lab 10/15/22 1502  PROT 6.3*  ALBUMIN 3.5  AST 19  ALT 18  ALKPHOS 43  BILITOT 0.8   Lipids No results for input(s): "CHOL", "TRIG", "HDL", "LABVLDL", "LDLCALC", "CHOLHDL" in the last 168 hours.  Hematology Recent Labs  Lab 10/15/22 1502  WBC 4.0  RBC 5.35  HGB 15.2  HCT 46.7  MCV 87.3  MCH 28.4  MCHC 32.5  RDW 16.0*  PLT 206   Thyroid No results for input(s): "TSH", "FREET4" in the last 168 hours.  BNPNo results for input(s): "BNP", "PROBNP" in the last 168 hours.  DDimer No results for input(s): "DDIMER" in the last 168 hours.  Radiology/Studies:   CXR 10/15/22: There are no new infiltrates or signs of pulmonary edema.   CT head 10/15/22: No acute intracranial abnormality.   CT PE 10/15/22: 1.  No evidence of pulmonary  embolism. 2. Similar ascending aortic dilatation at maximally 5.3 cm. Recommend semi-annual imaging followup by CTA or MRA and referral to cardiothoracic surgery if not already obtained. This recommendation follows 2010 ACCF/AHA/AATS/ACR/ASA/SCA/SCAI/SIR/STS/SVM Guidelines for the Diagnosis and Management of Patients With Thoracic Aortic Disease. Circulation. 2010; 121: [id number]. Aortic aneurysm NOS (ICD10-I71.9) 3. Pulmonary artery enlargement suggests pulmonary arterial hypertension.  Assessment and Plan:   Kristopher Scott is a 67 y.o. male with a hx of HFrEF 40-45% s/p MDT CRT-D 07/2022, AF on apixaban, HTN, HLD, DM, who is being seen 10/15/2022 for the evaluation of syncope at the request of Dr. Jearld Fenton.  Uncertain the exact etiology of his syncopal event today, but have highest suspicion for orthostatic/vasovagal syncope vs TIA. Lower suspicion for malignant arrhythmia, especially given MDT CRT-D interrogation in the ED today showing no arrhythmia events with a VT monitoring zone set >150 bpm.  His thoracic impedence data on his CRT-D does suggest that he may have been hypovolemic today (and his physical exam in the ED does not appear hypervolemic), which, in conjunction with his  endorsement of lightheadedness, may suggest that the cause of his presentation was related to dehydration/orthostatics. However, he does also have some other odd associated symptoms (like reported disequilibrium and slurred speech) that don't fit entirely with that diagnosis. Therefore, would also be concerned about TIA/stroke, especially in the context of missing his apixaban on Friday and Sunday (though ultimately defer to our neurology colleagues about the need for workup/treatment of this differential diagnosis).  Recommendations - Recommend admission for telemetry and further syncope workup - Recommend obtaining orthostatic vital signs - Please continue his home apixaban 5mg  BID (will need a dose this  evening) - Continue home carvedilol - Hold home lasix given hypovolemia - Hold his home Entresto tonight given AKI; if Cr normalizes in the morning can continue at home dose - Please obtain a TTE in the morning to workup his dilated PA (while I believe this finding is likely more chronic and unrelated to his presentation, his prior TTE in 2021 shows an RVSP of only ) - Should he need to go for a brain MRI, will need coordination with the [employer] device rep to ensure his hardware is all MRI-conditional prior to proceeding  Risk Assessment/Risk Scores:      New York Heart Association (NYHA) Functional Class NYHA Class I  CHA2DS2-VASc Score =   5 (CHF, HTN, DM, prior TIA)  This indicates a  7.2% annual risk of stroke. The patient's score is based upon:  For questions or updates, please contact [facility] Please consult [email or web address] for contact info under   Signed, Bella Kennedy, MD  10/15/2022 9:42 PM

## 2022-10-16 ENCOUNTER — Observation Stay (HOSPITAL_COMMUNITY): Payer: No Typology Code available for payment source

## 2022-10-16 DIAGNOSIS — R569 Unspecified convulsions: Secondary | ICD-10-CM | POA: Diagnosis not present

## 2022-10-16 DIAGNOSIS — Z79899 Other long term (current) drug therapy: Secondary | ICD-10-CM | POA: Diagnosis not present

## 2022-10-16 DIAGNOSIS — R4182 Altered mental status, unspecified: Secondary | ICD-10-CM

## 2022-10-16 DIAGNOSIS — R55 Syncope and collapse: Secondary | ICD-10-CM | POA: Diagnosis not present

## 2022-10-16 DIAGNOSIS — Z95810 Presence of automatic (implantable) cardiac defibrillator: Secondary | ICD-10-CM | POA: Diagnosis not present

## 2022-10-16 DIAGNOSIS — Z7901 Long term (current) use of anticoagulants: Secondary | ICD-10-CM | POA: Diagnosis not present

## 2022-10-16 DIAGNOSIS — I5022 Chronic systolic (congestive) heart failure: Secondary | ICD-10-CM | POA: Diagnosis not present

## 2022-10-16 LAB — GLUCOSE, CAPILLARY
Glucose-Capillary: 156 mg/dL — ABNORMAL HIGH (ref 70–99)
Glucose-Capillary: 126 mg/dL — ABNORMAL HIGH (ref 70–99)
Glucose-Capillary: 139 mg/dL — ABNORMAL HIGH (ref 70–99)
Glucose-Capillary: 135 mg/dL — ABNORMAL HIGH (ref 70–99)

## 2022-10-16 LAB — BASIC METABOLIC PANEL
Anion gap: 10 (ref 5–15)
BUN: 12 mg/dL (ref 8–23)
CO2: 22 mmol/L (ref 22–32)
Calcium: 8.7 mg/dL — ABNORMAL LOW (ref 8.9–10.3)
Chloride: 105 mmol/L (ref 98–111)
Creatinine, Ser: 1.23 mg/dL (ref 0.61–1.24)
GFR, Estimated: >60 mL/min (ref >60)
Glucose, Bld: 90 mg/dL (ref 70–99)
Potassium: 3.7 mmol/L (ref 3.5–5.1)
Sodium: 137 mmol/L (ref 135–145)

## 2022-10-16 LAB — ECHOCARDIOGRAM COMPLETE
AR max vel: 3.21 cm2
AV Area VTI: 3.63 cm2
AV Area mean vel: 3.16 cm2
AV Mean grad: 4 mmHg
AV Peak grad: 8.6 mmHg
Ao pk vel: 1.47 m/s
Area-P 1/2: 3.5 cm2
Calc EF: 58.8 %
Height: 73 in
P 1/2 time: 463 msec
S' Lateral: 4 cm
Single Plane A2C EF: 65.2 %
Single Plane A4C EF: 49.1 %
Weight: 4056.46 oz

## 2022-10-16 LAB — CBC
HCT: 45.4 % (ref 39.0–52.0)
Hemoglobin: 15.1 g/dL (ref 13.0–17.0)
MCH: 28.9 pg (ref 26.0–34.0)
MCHC: 33.3 g/dL (ref 30.0–36.0)
MCV: 86.8 fL (ref 80.0–100.0)
Platelets: 190 10*3/uL (ref 150–400)
RBC: 5.23 MIL/uL (ref 4.22–5.81)
RDW: 15.8 % — ABNORMAL HIGH (ref 11.5–15.5)
WBC: 4.5 10*3/uL (ref 4.0–10.5)
nRBC: 0 % (ref 0.0–0.2)

## 2022-10-16 LAB — TROPONIN I (HIGH SENSITIVITY)
Troponin I (High Sensitivity): 6 ng/L (ref <18)
Troponin I (High Sensitivity): 7 ng/L (ref <18)

## 2022-10-16 LAB — HEMOGLOBIN A1C
Hgb A1c MFr Bld: 6.5 % — ABNORMAL HIGH (ref 4.8–5.6)
Mean Plasma Glucose: 139.85 mg/dL

## 2022-10-16 LAB — HIV ANTIBODY (ROUTINE TESTING W REFLEX): HIV Screen 4th Generation wRfx: NONREACTIVE

## 2022-10-16 LAB — TSH: TSH: 1.628 u[IU]/mL (ref 0.350–4.500)

## 2022-10-16 MED ORDER — KETOROLAC TROMETHAMINE 15 MG/ML IJ SOLN
15.0000 mg | Freq: Once | INTRAMUSCULAR | Status: AC
  Administered 2022-10-16: 15 mg via INTRAVENOUS

## 2022-10-16 MED ORDER — ACETAMINOPHEN 325 MG PO TABS
650.0000 mg | ORAL_TABLET | Freq: Four times a day (QID) | ORAL | Status: DC | PRN
  Administered 2022-10-16 (×2): 650 mg via ORAL
  Filled 2022-10-16 (×2): qty 2

## 2022-10-16 MED ORDER — NITROGLYCERIN 0.4 MG SL SUBL
0.4000 mg | SUBLINGUAL_TABLET | SUBLINGUAL | Status: DC | PRN
  Administered 2022-10-16 (×2): 0.4 mg via SUBLINGUAL
  Filled 2022-10-16 (×2): qty 1

## 2022-10-16 MED ORDER — SACUBITRIL-VALSARTAN 24-26 MG PO TABS
1.0000 | ORAL_TABLET | Freq: Two times a day (BID) | ORAL | Status: DC
  Administered 2022-10-16 – 2022-10-17 (×3): 1 via ORAL
  Filled 2022-10-16 (×3): qty 1

## 2022-10-16 MED ORDER — MORPHINE SULFATE (PF) 2 MG/ML IV SOLN: 2.0000 mg | INTRAVENOUS | Status: DC | PRN

## 2022-10-16 MED ORDER — ROSUVASTATIN CALCIUM 20 MG PO TABS
40.0000 mg | ORAL_TABLET | Freq: Every day | ORAL | Status: DC
  Administered 2022-10-16 – 2022-10-17 (×2): 40 mg via ORAL
  Filled 2022-10-16 (×2): qty 2

## 2022-10-16 NOTE — Progress Notes (Signed)
   10/16/22 2256  BiPAP/CPAP/SIPAP  $ Non-Invasive Home Ventilator  Initial  $ Face Mask Medium Yes  BiPAP/CPAP/SIPAP Pt Type Adult  BiPAP/CPAP/SIPAP DREAMSTATIOND  Mask Type Nasal mask  Mask Size Medium  Respiratory Rate 16 breaths/min  EPAP 5 cmH2O  FiO2 (%) 21 %  Patient Home Equipment No  Auto Titrate No  CPAP/SIPAP surface wiped down Yes  BiPAP/CPAP /SiPAP Vitals  Resp (!) 22  MEWS Score/Color  MEWS Score 1  MEWS Score Color Kristopher Scott

## 2022-10-16 NOTE — Procedures (Signed)
Patient Name: KOLTON KIENLE  MRN: [id number]  Epilepsy Attending: Charlsie Quest  Referring Physician/Provider: Celine Mans, MD  Date: 10/16/2022 Duration: 25.49 mins  Patient history: 67yo M with ams and syncope getting eeg to evaluate for seizure.  Level of alertness: Awake, asleep  AEDs during EEG study: None  Technical aspects: This EEG study was done with scalp electrodes positioned according to the 10-20 International system of electrode placement. Electrical activity was reviewed with band pass filter of 1-70Hz , sensitivity of 7 uV/mm, display speed of 81mm/sec with a 60Hz  notched filter applied as appropriate. EEG data were recorded continuously and digitally stored.  Video monitoring was available and reviewed as appropriate.  Description: The posterior dominant rhythm consists of 8 Hz activity of moderate voltage (25-35 uV) seen predominantly in posterior head regions, symmetric and reactive to eye opening and eye closing. Sleep was characterized by vertex waves, sleep spindles (12 to 14 Hz), maximal frontocentral region. Physiologic photic driving was not seen during photic stimulation. Hyperventilation was not performed.     IMPRESSION: This study is within normal limits. No seizures or epileptiform discharges were seen throughout the recording.  A normal interictal EEG does not exclude the diagnosis of epilepsy.  Srija Southard Annabelle Harman

## 2022-10-16 NOTE — Progress Notes (Signed)
Mobility Specialist Progress Note   10/16/22 1741  Mobility  Activity Transferred from bed to chair  Level of Assistance Standby assist, set-up cues, supervision of patient - no hands on  Assistive Device Front wheel walker  Distance Ambulated (ft) 4 ft  Activity Response Tolerated well  Mobility Referral Yes  $Mobility charge 1 Mobility  Mobility Specialist Start Time (ACUTE ONLY) 1702  Mobility Specialist Stop Time (ACUTE ONLY) 1717  Mobility Specialist Time Calculation (min) (ACUTE ONLY) 15 min   Received in bed having no complaints and agreeable to eat dinner in chair. No faults on transfer, left w/ all needs met.  Frederico Hamman Mobility Specialist Please contact via SecureChat or  Rehab office at [phone]

## 2022-10-16 NOTE — Progress Notes (Signed)
Rounding Note    Patient Name: Kristopher Scott Date of Encounter: 10/16/2022  [facility] Cardiologist: None   Subjective   BP 124/79. Renal function improved (Cr 1.51>1.23).  Reports no further lightheadedness or syncope  Inpatient Medications    Scheduled Meds:  allopurinol  300 mg Oral Daily   apixaban  5 mg Oral BID   carvedilol  3.125 mg Oral BID WC   insulin aspart  0-15 Units Subcutaneous TID WC   isosorbide mononitrate  60 mg Oral Daily   rosuvastatin  40 mg Oral Daily   Continuous Infusions:  PRN Meds: acetaminophen, nitroGLYCERIN   Vital Signs    Vitals:   10/16/22 0634 10/16/22 0641 10/16/22 0700 10/16/22 0812  BP: 109/78 100/75  124/79  Pulse: 75 74  64  Resp:   20 18  Temp:    97.8 F (36.6 C)  TempSrc:    Oral  SpO2: 98% 94%  97%  Weight:      Height:        Intake/Output Summary (Last 24 hours) at 10/16/2022 0947 Last data filed at 10/16/2022 0865 Gross per 24 hour  Intake 236 ml  Output --  Net 236 ml      10/15/2022    2:59 PM 03/01/2020    9:25 AM 01/07/2020    5:00 AM  Last 3 Weights  Weight (lbs) 253 lb 8.5 oz 253 lb 6.4 oz 255 lb 15.3 oz  Weight (kg) 115 kg 114.941 kg 116.1 kg      Telemetry    V-paced - Personally Reviewed  ECG    No new ECG - Personally Reviewed  Physical Exam   GEN: No acute distress.   Neck: No JVD Cardiac: RRR, no murmurs, rubs, or gallops.  Respiratory: Clear to auscultation bilaterally. GI: Soft, nontender, non-distended  MS: No edema; No deformity. Neuro:  Nonfocal  Psych: Normal affect   Labs    High Sensitivity Troponin:   Recent Labs  Lab 10/15/22 1502 10/15/22 1703 10/16/22 0654 10/16/22 0757  TROPONINIHS 6 6 6 7      Chemistry Recent Labs  Lab 10/15/22 1502 10/16/22 0354  NA 139 137  K 3.2* 3.7  CL 107 105  CO2 19* 22  GLUCOSE 150* 90  BUN 15 12  CREATININE 1.51* 1.23  CALCIUM 8.9 8.7*  MG 2.0  --   PROT 6.3*  --   ALBUMIN 3.5  --   AST 19  --   ALT 18   --   ALKPHOS 43  --   BILITOT 0.8  --   GFRNONAA 51* >60  ANIONGAP 13 10    Lipids No results for input(s): "CHOL", "TRIG", "HDL", "LABVLDL", "LDLCALC", "CHOLHDL" in the last 168 hours.  Hematology Recent Labs  Lab 10/15/22 1502 10/16/22 0354  WBC 4.0 4.5  RBC 5.35 5.23  HGB 15.2 15.1  HCT 46.7 45.4  MCV 87.3 86.8  MCH 28.4 28.9  MCHC 32.5 33.3  RDW 16.0* 15.8*  PLT 206 190   Thyroid  Recent Labs  Lab 10/16/22 0354  TSH 1.628    BNPNo results for input(s): "BNP", "PROBNP" in the last 168 hours.  DDimer No results for input(s): "DDIMER" in the last 168 hours.   Radiology    EEG adult  Result Date: 10/16/2022 Charlsie Quest, MD     10/16/2022  8:45 AM Patient Name: Kristopher Scott MRN: [id number] Epilepsy Attending: Charlsie Quest Referring Physician/Provider: Celine Mans, MD Date: 10/16/2022  Duration: 25.49 mins Patient history: 67yo M with ams and syncope getting eeg to evaluate for seizure. Level of alertness: Awake, asleep AEDs during EEG study: None Technical aspects: This EEG study was done with scalp electrodes positioned according to the 10-20 International system of electrode placement. Electrical activity was reviewed with band pass filter of 1-70Hz , sensitivity of 7 uV/mm, display speed of 53mm/sec with a 60Hz  notched filter applied as appropriate. EEG data were recorded continuously and digitally stored.  Video monitoring was available and reviewed as appropriate. Description: The posterior dominant rhythm consists of 8 Hz activity of moderate voltage (25-35 uV) seen predominantly in posterior head regions, symmetric and reactive to eye opening and eye closing. Sleep was characterized by vertex waves, sleep spindles (12 to 14 Hz), maximal frontocentral region. Physiologic photic driving was not seen during photic stimulation. Hyperventilation was not performed.   IMPRESSION: This study is within normal limits. No seizures or epileptiform discharges were seen  throughout the recording. A normal interictal EEG does not exclude the diagnosis of epilepsy. Charlsie Quest   CT Angio Chest PE W/Cm &/Or Wo Cm  Result Date: 10/15/2022 CLINICAL DATA:  Pulmonary embolism suspected. Altered mental status. EXAM: CT ANGIOGRAPHY CHEST WITH CONTRAST TECHNIQUE: Multidetector CT imaging of the chest was performed using the standard protocol during bolus administration of intravenous contrast. Multiplanar CT image reconstructions and MIPs were obtained to evaluate the vascular anatomy. RADIATION DOSE REDUCTION: This exam was performed according to the departmental dose-optimization program which includes automated exposure control, adjustment of the mA and/or kV according to patient size and/or use of iterative reconstruction technique. CONTRAST:  75mL OMNIPAQUE IOHEXOL 350 MG/ML SOLN COMPARISON:  Chest radiograph of earlier today. CTA chest 01/01/2020 scratched that CT of the chest of 05/15/2022 from [facility] FINDINGS: Cardiovascular: The quality of this exam for evaluation of pulmonary embolism is good. No evidence of pulmonary embolism. Pacer/ICD. Aortic atherosclerosis. Borderline cardiomegaly, without pericardial effusion. Pulmonary artery enlargement, outflow tract 3.1 cm. Ascending aortic dilatation. 5.5 cm at the sinuses. 4.1 cm at the sinotubular junction. 5.2 cm in the mid ascending segment. Relatively similar to 05/15/2022 when remeasured. Bovine arch. Mediastinum/Nodes: No mediastinal or hilar adenopathy. Lungs/Pleura: No pleural fluid. 3 mm right lower lobe pulmonary nodule is similar on the prior. Can be presumed benign and do/does not warrant imaging follow-up per Fleischner criteria. Upper Abdomen: Normal imaged portions of the liver, spleen, stomach, pancreas, adrenal glands, gallbladder. Incompletely imaged upper pole right renal 6.8 cm fluid density lesion is a cyst on prior abdominal CT . In the absence of clinically indicated signs/symptoms require(s)  no independent follow-up. Musculoskeletal: Mild to moderate bilateral gynecomastia. Right rotator cuff repair. No acute osseous abnormality. Review of the MIP images confirms the above findings. IMPRESSION: 1.  No evidence of pulmonary embolism. 2. Similar ascending aortic dilatation at maximally 5.3 cm. Recommend semi-annual imaging followup by CTA or MRA and referral to cardiothoracic surgery if not already obtained. This recommendation follows 2010 ACCF/AHA/AATS/ACR/ASA/SCA/SCAI/SIR/STS/SVM Guidelines for the Diagnosis and Management of Patients With Thoracic Aortic Disease. Circulation. 2010; 121: E266-e369TAA. Aortic aneurysm NOS (ICD10-I71.9) 3. Pulmonary artery enlargement suggests pulmonary arterial hypertension. Electronically Signed   By: Jeronimo Greaves M.D.   On: 10/15/2022 17:09   DG Chest Port 1 View  Result Date: 10/15/2022 CLINICAL DATA:  Altered mental status EXAM: PORTABLE CHEST 1 VIEW COMPARISON:  02/01/2022 FINDINGS: Transverse diameter of heart is slightly increased. Thoracic aorta is ectatic. Lung fields are clear of any infiltrates or pulmonary  edema. There is no pleural effusion or pneumothorax. Pacemaker/defibrillator battery is seen in left infraclavicular region. Biventricular pacer leads are noted in place. IMPRESSION: There are no new infiltrates or signs of pulmonary edema. Electronically Signed   By: Ernie Avena M.D.   On: 10/15/2022 15:50   CT Head Wo Contrast  Result Date: 10/15/2022 CLINICAL DATA:  Head trauma, abnormal mental status (Age 67-64y) EXAM: CT HEAD WITHOUT CONTRAST TECHNIQUE: Contiguous axial images were obtained from the base of the skull through the vertex without intravenous contrast. RADIATION DOSE REDUCTION: This exam was performed according to the departmental dose-optimization program which includes automated exposure control, adjustment of the mA and/or kV according to patient size and/or use of iterative reconstruction technique. COMPARISON:  CT  Head 04/21/20 FINDINGS: Brain: No evidence of acute infarction, hemorrhage, hydrocephalus, extra-axial collection or mass lesion/mass effect. Sequela of moderate chronic microvascular ischemic change. Vascular: No hyperdense vessel or unexpected calcification. Skull: Normal. Negative for fracture or focal lesion. Sinuses/Orbits: No middle ear or mastoid effusion. Paranasal sinuses are clear. Orbits are unremarkable. Other: None. IMPRESSION: No acute intracranial abnormality. Electronically Signed   By: Lorenza Cambridge M.D.   On: 10/15/2022 15:38    Cardiac Studies     Patient Profile     67 y.o. male with a hx of HFrEF 40-45% s/p MDT CRT-D 07/2022, AF on apixaban, HTN, HLD, DM, who is being seen 10/15/2022 for the evaluation of syncope   Assessment & Plan    Syncope: Uncertain etiology of his syncopal event, but have highest suspicion for vasovagal syncope vs TIA. Lower suspicion for malignant arrhythmia, especially given MDT CRT-D interrogation in the ED today showing no arrhythmia events with a VT monitoring zone set >150 bpm. Normal orthostatics.  Echo shows EF is stable at 40 to 45%, normal RV function, mild aortic regurgitation, dilated aortic root measuring 45 mm -Monitor on telemetry -Hold lasix for now  Chronic systolic heart failure: EF 40-45% on most recent echo.  Stable on echo this admission -Suspect dry on admission, lasix held.  Normal orthostatics as above -Continue coreg 3.125 mg BID -Restart home entresto  Atrial fibrillation: continue coreg, Eliquis   For questions or updates, please contact [facility] Please consult [email or web address] for contact info under        Signed, Little Ishikawa, MD  10/16/2022, 9:47 AM

## 2022-10-16 NOTE — Progress Notes (Signed)
Pt c/o R sided chest pain that moved to L chest "under ribcage" radiating to L shoulder 6/10. Pt states he has hx of angina and takes NG as needed. MD informed, NG ordered and administered x2 with relief. Vitals remained stable, Pt Aox4.

## 2022-10-16 NOTE — Progress Notes (Signed)
Subjective - paged by RN for patient having left-sided chest pain that radiates to the right shoulder blade.  See patient patient says pain is sharp still present.  States he does have angina.  Objective - regular rate and rhythm, no murmurs appreciated, normal work of breathing on room air, resting comfortably in hospital bed, well-appearing, not diaphoretic  Plan - Chest pain History of angina.  Description was less consistent with acute MI.  Will rule out.  Vital signs and exam continue to be reassuring. -Nitroglycerin -Tylenol as needed -EKG ordered -Troponins ordered

## 2022-10-16 NOTE — TOC Initial Note (Signed)
Transition of Care [facility]) - Initial/Assessment Note    Patient Details  Name: Kristopher Scott MRN: [id number] Date of Birth: Feb 27, 1956  Transition of [facility] CM/SW Contact:    Kermit Balo, RN Phone Number: 10/16/2022, 3:09 PM  Clinical Narrative:                 Patient is from home with his spouse. Spouse is with him most of the time.  Wife provides needed transportation and oversees his medications.  Pt is active with [facility] for PCP.: Dr Nena Jordan CSW: Alger Simons [phone] ext [phone] Home health recommended. CM has sent the forms to the [place] and arranged HH with [facility]. Information on the AVS.  Walker ordered for home. [facility] have walker delivere to the home. ToC following  Expected Discharge Plan: Home w Home Health Services Barriers to Discharge: Continued Medical Work up   Patient Goals and CMS Choice     Choice offered to / list presented to : Patient      Expected Discharge Plan and Services   Discharge Planning Services: CM Consult Post Acute Care Choice: Home Health, Durable Medical Equipment Living arrangements for the past 2 months: Single Family Home                           HH Arranged: PT HH Agency: [facility] Date [facility] Agency Contacted: 10/16/22   Representative spoke with at [facility] Agency: Tresa Endo  Prior Living Arrangements/Services Living arrangements for the past 2 months: Single Family Home Lives with:: Spouse Patient language and need for interpreter reviewed:: Yes Do you feel safe going back to the place where you live?: Yes          Current home services: DME (rollator/ cane / shower seat) Criminal Activity/Legal Involvement Pertinent to Current Situation/Hospitalization: No - Comment as needed  Activities of Daily Living Home Assistive Devices/Equipment: Cane (specify quad or straight), Crutches, Walker (specify type) ADL Screening (condition at time of admission) Patient's cognitive  ability adequate to safely complete daily activities?: Yes Is the patient deaf or have difficulty hearing?: No Does the patient have difficulty seeing, even when wearing glasses/contacts?: Yes Does the patient have difficulty concentrating, remembering, or making decisions?: Yes Patient able to express need for assistance with ADLs?: Yes Does the patient have difficulty dressing or bathing?: No Independently performs ADLs?: Yes (appropriate for developmental age) Does the patient have difficulty walking or climbing stairs?: No Weakness of Legs: Both Weakness of Arms/Hands: Left  Permission Sought/Granted                  Emotional Assessment Appearance:: Appears stated age Attitude/Demeanor/Rapport: Engaged Affect (typically observed): Accepting Orientation: : Oriented to Self, Oriented to Place, Oriented to  Time, Oriented to Situation      Admission diagnosis:  Altered mental status, unspecified altered mental status type [R41.82] AMS (altered mental status) [R41.82] Patient Active Problem List   Diagnosis Date Noted   Syncope 10/15/2022   HTN (hypertension) 10/15/2022   T2DM (type 2 diabetes mellitus) (HCC) 10/15/2022   Aortic aneurysm (HCC) 01/07/2020   Chronic systolic heart failure (HCC) 01/03/2020   Acute on chronic systolic (congestive) heart failure (HCC) 01/02/2020   Atypical chest pain    Lumbar radiculopathy 08/12/2019   Pain in left knee 03/14/2019   Stiffness of left knee 11/25/2018   HFrEF (heart failure with reduced ejection fraction) (HCC) 05/12/2018  Hypercholesterolemia 05/12/2018   Hypertensive disorder 05/12/2018   Tear of lateral meniscus of knee 05/12/2018   Osteoarthritis of left knee 03/29/2018   Osteoarthritis of right knee 03/29/2018   Pacemaker 04/17/2017   Sleep apnea 04/17/2017   Rotator cuff tear 01/23/2012   PCP:  [facility], [facility] Pharmacy:   [id number] [place], [place] - [address]  [place] [address] Phone: [phone] Fax: [phone]  [facility] - [place], [place] - [address] [place] [address] Phone: [phone] Fax: [phone]     Social Determinants of Health (SDOH) Social History: SDOH Screenings   Food Insecurity: No Food Insecurity (10/15/2022)  Housing: Low Risk  (10/15/2022)  Transportation Needs: Unmet Transportation Needs (10/15/2022)  Utilities: Not At Risk (10/15/2022)  Tobacco Use: Low Risk  (10/15/2022)   SDOH Interventions:     Readmission Risk Interventions     No data to display

## 2022-10-16 NOTE — Evaluation (Signed)
Clinical/Bedside Swallow Evaluation Patient Details  Name: Kristopher Scott MRN: [id number] Date of Birth: 04-Dec-1955  Today's Date: 10/16/2022 Time: SLP Start Time (ACUTE ONLY): 1223 SLP Stop Time (ACUTE ONLY): 1237 SLP Time Calculation (min) (ACUTE ONLY): 14 min  Past Medical History:  Past Medical History:  Diagnosis Date   Anxiety    Arthritis    Atrial fibrillation (HCC)    Back pain    ESI/TENS/therapy   Cardiac defibrillator in place 12/2014   Cluster headaches    Congestive heart failure (CHF) (HCC)    Depression    Diabetes mellitus without complication (HCC)    GERD (gastroesophageal reflux disease)    Hyperlipidemia    Hypertension    Pinched vertebral nerve    pain/burning from left thigh to left foot at times   Sleep apnea    Past Surgical History:  Past Surgical History:  Procedure Laterality Date   CARDIAC DEFIBRILLATOR PLACEMENT     HYDROCELE EXCISION / REPAIR     KNEE SURGERY Left    ROTATOR CUFF REPAIR Bilateral    HPI:  Pt is a 67 y.o. male who presented with syncope and AMS. CT head negative. MRI brain not completed at time of evaluation. CXR 6/12: new infiltrates or signs of pulmonary edema. EEG 6/13 WNL. PMH: GERD, HTN. HLD, sleep apnea, depression, CHF, Afib, anxiety.    Assessment / Plan / Recommendation  Clinical Impression  Pt was seen for bedside swallow evaluation with his wife present. Both parties denied the pt having a history of oropharyngeal dysphagia and the pt's wife denied observance of any acute dysphagia symptoms. Oral mechanism exam was [facility] and dentition was adequate. Mild oral holding was noted with puree, but he otherwise tolerated all solids and liquids without signs or symptoms of oropharyngeal dysphagia. A regular texture diet with thin liquids is recommended at this time and further skilled SLP services are not clinically indicated for swallowing. Pt's processing speed appeared slow during conversation, and pt reported that he  feels as though "everything is moving in slow motion", and that he has required additional processing time for sentence/thought formulation. Consider speech-language-cognition evaluation pending the determined/suspected etiology of these symptoms. SLP Visit Diagnosis: Dysphagia, unspecified (R13.10)    Aspiration Risk  No limitations    Diet Recommendation Regular;Thin liquid   Liquid Administration via: Cup;Straw Medication Administration: Whole meds with liquid Supervision: Patient able to self feed Postural Changes: Seated upright at 90 degrees    Other  Recommendations Oral Care Recommendations: Oral care BID    Recommendations for follow up therapy are one component of a multi-disciplinary discharge planning process, led by the attending physician.  Recommendations may be updated based on patient status, additional functional criteria and insurance authorization.  Follow up Recommendations        Assistance Recommended at Discharge    Functional Status Assessment    Frequency and Duration            Prognosis        Swallow Study   General Date of Onset: 10/15/22 HPI: Pt is a 67 y.o. male who presented with syncope and AMS. CT head negative. MRI brain not completed at time of evaluation. CXR 6/12: new infiltrates or signs of pulmonary edema. EEG 6/13 WNL. PMH: GERD, HTN. HLD, sleep apnea, depression, CHF, Afib, anxiety. Type of Study: Bedside Swallow Evaluation Previous Swallow Assessment: none Diet Prior to this Study: Regular;Thin liquids (Level 0) Temperature Spikes Noted: No Respiratory Status: Room air History of  Recent Intubation: No Behavior/Cognition: Alert;Cooperative;Pleasant mood Oral Cavity Assessment: Within Functional Limits Oral Care Completed by SLP: No Oral Cavity - Dentition: Adequate natural dentition Vision: Functional for self-feeding Self-Feeding Abilities: Able to feed self Patient Positioning: Upright in bed;Postural control adequate for  testing Baseline Vocal Quality: Normal Volitional Cough: Strong Volitional Swallow: Able to elicit    Oral/Motor/Sensory Function Overall Oral Motor/Sensory Function: Within functional limits   Ice Chips Ice chips: Not tested   Thin Liquid Thin Liquid: Within functional limits Presentation: Cup;Straw    Nectar Thick Nectar Thick Liquid: Not tested   Honey Thick Honey Thick Liquid: Not tested   Puree Puree: Impaired Presentation: Spoon Oral Phase Functional Implications: Oral holding   Solid     Solid: Within functional limits Presentation: Self Fed     Kristopher Scott [name], MS, CCC-SLP Neuro Diagnostic Specialist  Acute Rehabilitation Services Office number: [phone]  Scheryl Marten 10/16/2022,12:57 PM

## 2022-10-16 NOTE — Evaluation (Signed)
Physical Therapy Evaluation Patient Details Name: Kristopher Scott MRN: [id number] DOB: 10/14/1955 Today's Date: 10/16/2022  History of Present Illness  Pt is a 67 y/o M admitted on 10/15/22 after presenting with c/o nausea, dizziness, lightheadedness, & syncope. PMH: syncope, gout, a-fib, HFrEF s/p pacemaker, GERD, HLD, DM2, HA requiring botox  Clinical Impression  Pt seen for PT evaluation with pt pleasant & agreeable to tx. Pt reports prior to admission he was mod I with SPC but endorses 2 falls in the last month & up to 7 in the last 6 months. Pt notes hx of LLE weakness & reports it will give out on him. On this date, pt is able to complete bed mobility with supervision with extra time & effort & use of hospital bed features. Pt transfers STS with CGA fade to supervision & ambulates with RW with CGA with slow, steady gait (pt reports current reduced gait speed is his baseline). Pt with c/o lightheadedness with positional changes but reports it improves after ~1 minute of rest/maintaining position. Pt does stand ~2-3 minutes (speaking to wife on speakerphone) & begins to demonstrate BLE knee instability (L worse than RLE) & requires sitting break; pt also notes everything is spinning at this time. Pt also notes changes in vision since admission with everything appearing more "fuzzy". Will continue to follow pt acutely to address balance, strengthening, & gait with LRAD. Recommend pt use RW at this time.  BP checked in LUE  BP HR  Supine  120/86 mmHg MAP 96 68 bpm  Sitting 122/86 mmHg MAP 98 72 bpm  Standing at 0 120/91 mmHg MAP 98 79 bpm  Standing at 3 minutes 124/85 mmHg MAP 97 84 bpm        Recommendations for follow up therapy are one component of a multi-disciplinary discharge planning process, led by the attending physician.  Recommendations may be updated based on patient status, additional functional criteria and insurance authorization.  Follow Up Recommendations       Assistance  Recommended at Discharge Intermittent Supervision/Assistance  Patient can return home with the following  A little help with walking and/or transfers;A little help with bathing/dressing/bathroom;Assistance with cooking/housework;Assist for transportation;Help with stairs or ramp for entrance    Equipment Recommendations Rolling walker (2 wheels)  Recommendations for Other Services       Functional Status Assessment Patient has had a recent decline in their functional status and demonstrates the ability to make significant improvements in function in a reasonable and predictable amount of time.     Precautions / Restrictions Precautions Precautions: Fall Restrictions Weight Bearing Restrictions: No      Mobility  Bed Mobility Overal bed mobility: Needs Assistance Bed Mobility: Supine to Sit     Supine to sit: HOB elevated, Supervision     General bed mobility comments: extra time & effort to upright trunk to come to sitting EOB with use of bed rails PRN, HOB elevated    Transfers Overall transfer level: Needs assistance Equipment used: None, Rolling walker (2 wheels) Transfers: Sit to/from Stand Sit to Stand: Min guard, Supervision           General transfer comment: Pt able to transfer STS from EOB with CGA fade to close supervision.    Ambulation/Gait Ambulation/Gait assistance: Min guard Gait Distance (Feet): 70 Feet Assistive device: Rolling walker (2 wheels) Gait Pattern/deviations: Decreased step length - right, Decreased step length - left, Decreased stride length Gait velocity: decreased (pt report this is baseline though)  Stairs            Wheelchair Mobility    Modified Rankin (Stroke Patients Only)       Balance Overall balance assessment: Needs assistance Sitting-balance support: Feet supported Sitting balance-Leahy Scale: Good Sitting balance - Comments: supervision static sitting EOB   Standing balance support: Bilateral  upper extremity supported, During functional activity, Reliant on assistive device for balance Standing balance-Leahy Scale: Fair                               Pertinent Vitals/Pain Pain Assessment Pain Assessment: 0-10 Pain Score: 3  Pain Location: L shoulder (new pain), low back (chronic pain) Pain Descriptors / Indicators: Discomfort Pain Intervention(s): Monitored during session, Limited activity within patient's tolerance    Home Living Family/patient expects to be discharged to:: Private residence Living Arrangements: Spouse/significant other Available Help at Discharge: Family Type of Home: House Home Access: Ramped entrance (at back entrance, pt primarily uses this entrance to access his home)       Home Layout: One level Home Equipment: Cane - single point;Rollator (4 wheels)      Prior Function Prior Level of Function : Independent/Modified Independent;History of Falls (last six months);Driving             Mobility Comments: Pt reports he's ambulated with SPC 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 Pt endorses 2 falls in past month but up to 7 falls in the past 6 months 2/2 LLE giving out & these dizzy/passing out episodes. ADLs Comments: Wife assists with donning socks.     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        General Comments      Exercises     Assessment/Plan    PT Assessment Patient needs continued PT services  PT Problem List Decreased strength;Decreased balance;Decreased safety awareness;Decreased knowledge of use of DME;Decreased activity tolerance;Decreased mobility       PT Treatment Interventions DME instruction;Therapeutic exercise;Balance training;Gait training;Neuromuscular re-education;Functional mobility training;Therapeutic activities;Patient/family education    PT Goals (Current goals can be found in the Care Plan section)  Acute Rehab PT Goals Patient Stated Goal: get better, figure out what's going on PT Goal Formulation: With patient Time For Goal Achievement: 10/30/22 Potential to Achieve Goals: Good    Frequency Min 3X/week     Co-evaluation               AM-PAC PT "6 Clicks" Mobility  Outcome Measure Help needed turning from your back to your side while in a flat bed without using bedrails?: None Help needed moving from lying on your back  to sitting on the side of a flat bed without using bedrails?: A Little Help needed moving to and from a bed to a chair (including a wheelchair)?: A Little Help needed standing up from a chair using your arms (e.g., wheelchair or bedside chair)?: A Little Help needed to walk in hospital room?: A Little Help needed climbing 3-5 steps with a railing? : A Little 6 Click Score: 19    End of Session Equipment Utilized During Treatment: Gait belt Activity Tolerance: Patient tolerated treatment well Patient left:  (in handoff to transport services (pt being taken to vascular))   PT Visit Diagnosis: Muscle weakness (generalized) (M62.81);History of falling (Z91.81)    Time: 1610-9604 PT Time Calculation (min) (ACUTE ONLY): 25 min   Charges:    PT Evaluation $PT Eval Low Complexity: 1 Low          Aleda Grana, PT, DPT 10/16/22, 10:09 AM   Sandi Mariscal 10/16/2022, 10:07 AM

## 2022-10-16 NOTE — Progress Notes (Signed)
EEG complete - results pending 

## 2022-10-16 NOTE — Plan of Care (Signed)

## 2022-10-16 NOTE — Progress Notes (Addendum)
Daily Progress Note Intern Pager: [phone]  Patient name: Kristopher Scott Medical record number: [id number] Date of birth: Feb 02, 1956 Age: 67 y.o. Gender: male  Primary Care Provider: [facility] Consultants: Cardiology Code Status: Full  Pt Overview and Major Events to Date:  6/12 admitted  Assessment and Plan:  Kristopher Scott is a 67 y.o. male presenting with syncope.   PMH includes gout, arthritis, A-fib, HFrEF, pacemaker, GERD, HLD, T2DM, headaches   Hospital Problem List      Hospital     * (Principal) Syncope     Unclear etiology, possibly vasovagal versus TIA.  Currently stable.   Orthostatic vital signs WNL.  EEG and CT head unremarkable.  Patient is  fully alert and oriented but has some difficulty with word finding,  although his baseline mental status is unclear. -Follow-up MRI -Continue telemetry -Attempt to obtain collateral information from family members        HFrEF (heart failure with reduced ejection fraction) (HCC)     HFrEF 40-45% s/p pacemaker implantation on 07/2022. Appears euvolemic  on exam. No events on pacemaker interrogation. Evidence of pulmonary  artery dilation on CTA PE.  Repeat echo with stable EF, G1 DD, mild aortic  regurgitation, dilated aortic root. -Cardiology consulted, recs appreciated -Continue home Carvedilol -Hold home Lasix -Restart Entresto  -Continue statin        Chronic systolic heart failure (HCC)     T2DM (type 2 diabetes mellitus) (HCC)     A1c 6.5.  Home regimen includes Jardiance-metformin, Ozempic. -mSSI, CBGs      Chronic Stable Conditions Atrial Fibrillation - HR paced, controlled, continue Eliquis, Carvedilol HTN - monitor BP, Continue Imdur, Carvedilol Gout - Continue Allopurinol PTSD - Prazosin held OSA - CPAP QHS HLD - Continue statin  FEN/GI: Heart healthy carb modified diet PPx: Eliquis Dispo:Pending PT recommendations  pending clinical improvement . Barriers include  additional management as above.   Subjective:  Early this morning, had some chest pain.  Normal troponin, unremarkable EKG. his pain has largely resolved although he does report that he often has left shoulder/arm pain ever since his pacemaker was placed and thinks that the nerve might be impinged.  Overall, feels close to normal but still does not remember many of the events leading to his admission.  Denies headache.  Endorses very slight vision blurriness.  Denies N/V/abdominal pain, SOB/CP  Objective: Temp:  [97.7 F (36.5 C)-98 F (36.7 C)] 98 F (36.7 C) (06/13 1121) Pulse Rate:  [60-91] 64 (06/13 1121) Resp:  [9-25] 16 (06/13 1108) BP: (97-135)/(62-90) 123/76 (06/13 1121) SpO2:  [93 %-100 %] 94 % (06/13 1121) Weight:  [147 kg] 115 kg (06/12 1459) Physical Exam: General: NAD, awake, alert Cardiovascular: RRR no murmurs Respiratory: CTAB normal WOB on RA Abdomen: Soft NT/ND Extremities: No significant edema Neuro: Cranial nerves II through XII intact. Slightly decreased sensation on L face compared to R.  5/5 strength bilateral upper and lower extremities, although LUE and LLE limited due to chronic pain  Laboratory: Most recent CBC Lab Results  Component Value Date   WBC 4.5 10/16/2022   HGB 15.1 10/16/2022   HCT 45.4 10/16/2022   MCV 86.8 10/16/2022   PLT 190 10/16/2022   Most recent BMP    Latest Ref Rng & Units 10/16/2022    3:54 AM  BMP  Glucose 70 - 99 mg/dL 90   BUN 8 - 23 mg/dL 12   Creatinine 8.29 - 1.24 mg/dL 5.62  Sodium 135 - 145 mmol/L 137   Potassium 3.5 - 5.1 mmol/L 3.7   Chloride 98 - 111 mmol/L 105   CO2 22 - 32 mmol/L 22   Calcium 8.9 - 10.3 mg/dL 8.7     Troponin 6, 7 TSH 1.628   Kristopher Drafts, MD 10/16/2022, 1:05 PM  PGY-1, [facility] FPTS Intern pager: [phone], text pages welcome Secure chat group [facility] Teaching Service

## 2022-10-16 NOTE — Progress Notes (Signed)
SLP Cancellation Note  Patient Details Name: Kristopher Scott MRN: [id number] DOB: 07-23-1955   Cancelled treatment:       Reason Eval/Treat Not Completed: Patient at procedure or test/unavailable (Pt off unit at this time. SLP will follow up.)  Facundo Allemand [name], MS, CCC-SLP Neuro Diagnostic Specialist  Acute Rehabilitation Services Office number: [phone]  Scheryl Marten 10/16/2022, 10:45 AM

## 2022-10-16 NOTE — Evaluation (Signed)
Occupational Therapy Evaluation Patient Details Name: Kristopher Scott MRN: [id number] DOB: February 15, 1956 Today's Date: 10/16/2022   History of Present Illness Pt is a 67 y/o M admitted on 10/15/22 after presenting with c/o nausea, dizziness, lightheadedness, & syncope. PMH: syncope, gout, a-fib, HFrEF s/p pacemaker, GERD, HLD, DM2, HA requiring botox   Clinical Impression   PTA, pt lives with spouse, typically Modified Independent with ADLs (aside from socks) and mobility using Rollator vs cane. Pt presents now with minor deficits in balance (hx of falls) and endurance. Provided education on fall prevention (handout provided) and AE use for LB ADLs with pt able to return demo their use well. Pt able to mobilize with RW in room and manage LB ADLs with no more than Supervision. Will follow acutely though anticipate no OT needs at DC.      Recommendations for follow up therapy are one component of a multi-disciplinary discharge planning process, led by the attending physician.  Recommendations may be updated based on patient status, additional functional criteria and insurance authorization.   Assistance Recommended at Discharge PRN  Patient can return home with the following A little help with bathing/dressing/bathroom;Assistance with cooking/housework;Assist for transportation;Help with stairs or ramp for entrance    Functional Status Assessment  Patient has had a recent decline in their functional status and demonstrates the ability to make significant improvements in function in a reasonable and predictable amount of time.  Equipment Recommendations  None recommended by OT    Recommendations for Other Services       Precautions / Restrictions Precautions Precautions: Fall Restrictions Weight Bearing Restrictions: No      Mobility Bed Mobility Overal bed mobility: Modified Independent Bed Mobility: Supine to Sit, Sit to Supine                Transfers Overall transfer  level: Needs assistance Equipment used: Rolling walker (2 wheels) Transfers: Sit to/from Stand Sit to Stand: Supervision                  Balance Overall balance assessment: Needs assistance Sitting-balance support: Feet supported Sitting balance-Leahy Scale: Good     Standing balance support: Bilateral upper extremity supported, During functional activity, Reliant on assistive device for balance Standing balance-Leahy Scale: Fair                             ADL either performed or assessed with clinical judgement   ADL Overall ADL's : Needs assistance/impaired Eating/Feeding: Independent   Grooming: Supervision/safety;Standing   Upper Body Bathing: Modified independent   Lower Body Bathing: Supervison/ safety   Upper Body Dressing : Modified independent   Lower Body Dressing: Supervision/safety;With adaptive equipment Lower Body Dressing Details (indicate cue type and reason): educated on sock aid, reacher and shoe horn for dressing tasks. Provided AE handout, info where to purchase and considerations (cloth sock aid for swelling and smaller socks) Toilet Transfer: Supervision/safety;Ambulation;Rolling walker (2 wheels)   Toileting- Clothing Manipulation and Hygiene: Supervision/safety;Sitting/lateral lean;Sit to/from stand       Functional mobility during ADLs: Supervision/safety;Rolling walker (2 wheels) General ADL Comments: Focus on fall prevention education and AE for LB ADLs to maximize independence.     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)   Cervical / Trunk Assessment Cervical / Trunk Assessment: Normal   Communication Communication Communication: No difficulties   Cognition Arousal/Alertness: Awake/alert Behavior During Therapy: WFL for tasks assessed/performed Overall Cognitive Status: Within Functional Limits for tasks assessed                                 General Comments: sleep deprived but overall functional     General Comments  Wife at bedside, engaged and supportive    Exercises     Shoulder Instructions      Home Living Family/patient expects to be discharged to:: Private residence Living Arrangements: Spouse/significant other Available Help at Discharge: Family;Available 24 hours/day Type of Home: House Home Access: Ramped entrance     Home Layout: One level     Bathroom Shower/Tub: Producer, television/film/video: Handicapped height     Home Equipment: Cane - single point;Rollator (4 wheels);Other (comment) (fold down shower seat)          Prior Functioning/Environment Prior Level of Function : Independent/Modified Independent;History of Falls (last six months);Driving             Mobility Comments: Pt reports he's ambulated with SPC 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 Pt endorses 2 falls in past month but up to 7 falls in the past 6 months 2/2 LLE giving out & these dizzy/passing out episodes. ADLs Comments: Wife assists with donning compresion stockings, sometimes shirt since pacemaker discomfort. typically able to manage ADLs without assist, does some iADLs        OT Problem List: Decreased knowledge of use of DME or AE;Decreased activity tolerance;Impaired balance (sitting and/or standing)      OT  Treatment/Interventions: Self-care/ADL training;Therapeutic exercise;DME and/or AE instruction;Energy conservation;Therapeutic activities;Patient/family education    OT Goals(Current goals can be found in the care plan section) Acute Rehab OT Goals Patient Stated Goal: decrease falls OT Goal Formulation: With patient/family Time For Goal Achievement: 10/30/22 Potential to Achieve Goals: Good  OT Frequency: Min 2X/week    Co-evaluation              AM-PAC OT "6 Clicks" Daily Activity     Outcome Measure Help from another person eating meals?: None Help from another person taking care of personal grooming?: A Little Help from another person toileting, which includes using toliet, bedpan, or urinal?: A Little Help from another person bathing (including washing, rinsing, drying)?: A Little Help from another person to put on and taking off regular upper body clothing?: A Little Help from another person to put on and taking off regular lower body clothing?: A Little 6 Click Score: 19   End of Session Equipment Utilized During Treatment: Gait belt;Rolling walker (2 wheels) Nurse Communication: Mobility status  Activity Tolerance: Patient tolerated treatment well Patient left: in bed;with call bell/phone within reach;with family/visitor present  OT Visit Diagnosis: Unsteadiness on feet (R26.81);Other abnormalities of gait and mobility (R26.89);Muscle weakness (generalized) (M62.81)  Time: 1610-9604 OT Time Calculation (min): 32 min Charges:  OT General Charges $OT Visit: 1 Visit OT Evaluation $OT Eval Low Complexity: 1 Low OT Treatments $Self Care/Home Management : 8-22 mins  Bradd Canary, OTR/L Acute Rehab Services Office: [phone]   Lorre Munroe 10/16/2022, 1:28 PM

## 2022-10-17 ENCOUNTER — Encounter (HOSPITAL_COMMUNITY): Payer: Self-pay | Admitting: Family Medicine

## 2022-10-17 ENCOUNTER — Other Ambulatory Visit (HOSPITAL_COMMUNITY): Payer: Self-pay

## 2022-10-17 DIAGNOSIS — I7121 Aneurysm of the ascending aorta, without rupture: Secondary | ICD-10-CM

## 2022-10-17 DIAGNOSIS — R55 Syncope and collapse: Secondary | ICD-10-CM | POA: Diagnosis not present

## 2022-10-17 DIAGNOSIS — I5022 Chronic systolic (congestive) heart failure: Secondary | ICD-10-CM | POA: Diagnosis not present

## 2022-10-17 LAB — LIPID PANEL
Cholesterol: 168 mg/dL (ref 0–200)
HDL: 56 mg/dL (ref >40)
LDL Cholesterol: 84 mg/dL (ref 0–99)
Total CHOL/HDL Ratio: 3 RATIO
Triglycerides: 142 mg/dL (ref <150)
VLDL: 28 mg/dL (ref 0–40)

## 2022-10-17 LAB — BASIC METABOLIC PANEL
Anion gap: 8 (ref 5–15)
BUN: 21 mg/dL (ref 8–23)
CO2: 23 mmol/L (ref 22–32)
Calcium: 9.1 mg/dL (ref 8.9–10.3)
Chloride: 103 mmol/L (ref 98–111)
Creatinine, Ser: 1.26 mg/dL — ABNORMAL HIGH (ref 0.61–1.24)
GFR, Estimated: >60 mL/min (ref >60)
Glucose, Bld: 125 mg/dL — ABNORMAL HIGH (ref 70–99)
Potassium: 3.7 mmol/L (ref 3.5–5.1)
Sodium: 134 mmol/L — ABNORMAL LOW (ref 135–145)

## 2022-10-17 LAB — GLUCOSE, CAPILLARY
Glucose-Capillary: 102 mg/dL — ABNORMAL HIGH (ref 70–99)
Glucose-Capillary: 93 mg/dL (ref 70–99)

## 2022-10-17 LAB — VITAMIN B12: Vitamin B-12: 413 pg/mL (ref 180–914)

## 2022-10-17 LAB — TSH: TSH: 1.017 u[IU]/mL (ref 0.350–4.500)

## 2022-10-17 MED ORDER — CARVEDILOL 3.125 MG PO TABS
3.1250 mg | ORAL_TABLET | Freq: Two times a day (BID) | ORAL | 0 refills | Status: AC
  Filled 2022-10-17: qty 60, 30d supply, fill #0

## 2022-10-17 MED ORDER — FUROSEMIDE 20 MG PO TABS: 20.0000 mg | ORAL_TABLET | Freq: Every day | ORAL | 2 refills | Status: DC | PRN

## 2022-10-17 MED ORDER — ACETAMINOPHEN 325 MG PO TABS: 650.0000 mg | ORAL_TABLET | Freq: Four times a day (QID) | ORAL | Status: AC | PRN

## 2022-10-17 MED ORDER — INSULIN ASPART 100 UNIT/ML IJ SOLN: 0.0000 [IU] | Freq: Three times a day (TID) | INTRAMUSCULAR | Status: DC

## 2022-10-17 MED ORDER — POTASSIUM CHLORIDE 20 MEQ PO PACK
40.0000 meq | PACK | Freq: Once | ORAL | Status: AC
  Administered 2022-10-17: 40 meq via ORAL
  Filled 2022-10-17: qty 2

## 2022-10-17 NOTE — Assessment & Plan Note (Signed)
A1c 6.5.  Home regimen includes Jardiance-metformin, Ozempic. -mSSI, CBGs 

## 2022-10-17 NOTE — Plan of Care (Signed)
  Problem: Education: Goal: Ability to describe self-care measures that may prevent or decrease complications (Diabetes Survival Skills Education) will improve Outcome: Progressing Goal: Individualized Educational Video(s) Outcome: Progressing   Problem: Coping: Goal: Ability to adjust to condition or change in health will improve Outcome: Progressing   Problem: Fluid Volume: Goal: Ability to maintain a balanced intake and output will improve Outcome: Progressing   Problem: Health Behavior/Discharge Planning: Goal: Ability to identify and utilize available resources and services will improve Outcome: Progressing Goal: Ability to manage health-related needs will improve Outcome: Progressing   Problem: Metabolic: Goal: Ability to maintain appropriate glucose levels will improve Outcome: Progressing   Problem: Nutritional: Goal: Maintenance of adequate nutrition will improve Outcome: Progressing Goal: Progress toward achieving an optimal weight will improve Outcome: Progressing   Problem: Skin Integrity: Goal: Risk for impaired skin integrity will decrease Outcome: Progressing Note: Patient's skin was kept CD&I at all times on this shift to prevent skin breakdown. Patient was to re-positioned himself q2hrs   Problem: Tissue Perfusion: Goal: Adequacy of tissue perfusion will improve Outcome: Progressing   Problem: Education: Goal: Knowledge of General Education information will improve Description: Including pain rating scale, medication(s)/side effects and non-pharmacologic comfort measures Outcome: Progressing   Problem: Health Behavior/Discharge Planning: Goal: Ability to manage health-related needs will improve Outcome: Progressing   Problem: Clinical Measurements: Goal: Ability to maintain clinical measurements within normal limits will improve Outcome: Progressing Goal: Will remain free from infection Outcome: Progressing Goal: Diagnostic test results will  improve Outcome: Progressing Goal: Respiratory complications will improve Outcome: Progressing Goal: Cardiovascular complication will be avoided Outcome: Progressing   Problem: Activity: Goal: Risk for activity intolerance will decrease Outcome: Progressing   Problem: Nutrition: Goal: Adequate nutrition will be maintained Outcome: Progressing   Problem: Coping: Goal: Level of anxiety will decrease Outcome: Progressing   Problem: Elimination: Goal: Will not experience complications related to bowel motility Outcome: Progressing Goal: Will not experience complications related to urinary retention Outcome: Progressing   Problem: Pain Managment: Goal: General experience of comfort will improve Outcome: Progressing   Problem: Safety: Goal: Ability to remain free from injury will improve Outcome: Progressing   Problem: Skin Integrity: Goal: Risk for impaired skin integrity will decrease Outcome: Progressing

## 2022-10-17 NOTE — TOC Transition Note (Signed)
Transition of Care [facility]) - CM/SW Discharge Note   Patient Details  Name: Kristopher Scott MRN: [id number] Date of Birth: [birth year]  Transition of Care [facility]) CM/SW Contact:  Kermit Balo, RN Phone Number: 10/17/2022, 1:04 PM   Clinical Narrative:    Pt is discharging home with home health services through [facility]. CM spoke to [facility]ts [facility]: Bertina and they have the information needed for the [facility] services and walker ordered for home. [name] will be delivered to the home. Pt is aware.  Pt has transportation home.    Final next level of care: Home w Home Health Services Barriers to Discharge: No Barriers Identified   Patient Goals and CMS Choice   Choice offered to / list presented to : Patient  Discharge Placement                         Discharge Plan and Services Additional resources added to the After Visit Summary for     Discharge Planning Services: CM Consult Post Acute Care Choice: Home Health, Durable Medical Equipment                    HH Arranged: PT, OT [facility] Agency: [facility] Date [facility] Agency Contacted: 10/16/22   Representative spoke with at [facility] Agency: Tresa Endo  Social Determinants of Health (SDOH) Interventions SDOH Screenings   Food Insecurity: No Food Insecurity (10/15/2022)  Housing: Low Risk  (10/17/2022)  Transportation Needs: No Transportation Needs (10/17/2022)  Recent Concern: Transportation Needs - Unmet Transportation Needs (10/15/2022)  Utilities: Not At Risk (10/15/2022)  Alcohol Screen: Low Risk  (10/17/2022)  Financial Resource Strain: Low Risk  (10/17/2022)  Tobacco Use: Low Risk  (10/17/2022)     Readmission Risk Interventions     No data to display

## 2022-10-17 NOTE — Progress Notes (Addendum)
Progress Note  Patient Name: Kristopher Scott Date of Encounter: 10/17/2022  Primary Cardiologist: At [facility]   Subjective   Feeling well, no chest pain, SOB, dizziness.   Inpatient Medications    Scheduled Meds:  allopurinol  300 mg Oral Daily   apixaban  5 mg Oral BID   carvedilol  3.125 mg Oral BID WC   insulin aspart  0-15 Units Subcutaneous TID WC   isosorbide mononitrate  60 mg Oral Daily   rosuvastatin  40 mg Oral Daily   sacubitril-valsartan  1 tablet Oral BID   Continuous Infusions:  PRN Meds: acetaminophen, nitroGLYCERIN   Vital Signs    Vitals:   10/16/22 2256 10/17/22 0000 10/17/22 0424 10/17/22 0826  BP:  124/69 121/75 129/84  Pulse:  63 66 66  Resp: (!) 22 18 18 19   Temp:  98 F (36.7 C) 97.8 F (36.6 C) 97.8 F (36.6 C)  TempSrc:  Axillary Axillary Oral  SpO2:  98% 99% 100%  Weight:      Height:       No intake or output data in the 24 hours ending 10/17/22 0926    10/15/2022    2:59 PM 03/01/2020    9:25 AM 01/07/2020    5:00 AM  Last 3 Weights  Weight (lbs) 253 lb 8.5 oz 253 lb 6.4 oz 255 lb 15.3 oz  Weight (kg) 115 kg 114.941 kg 116.1 kg     Telemetry    Atrial sensed, V paced, 5 beats NSVT - Personally Reviewed  ECG    No new tracings - Personally Reviewed  Physical Exam   GEN: No acute distress.  HEENT: Normocephalic, atraumatic, sclera non-icteric. Neck: No JVD or bruits. Cardiac: RRR no murmurs, rubs, or gallops.  Respiratory: Clear to auscultation bilaterally. Breathing is unlabored. GI: Soft, nontender, non-distended, BS +x 4. MS: no deformity. Extremities: No clubbing or cyanosis. No edema. Distal pedal pulses are 2+ and equal bilaterally. Neuro:  AAOx3. Follows commands. Psych:  Responds to questions appropriately with a normal affect.  Labs    High Sensitivity Troponin:   Recent Labs  Lab 10/15/22 1502 10/15/22 1703 10/16/22 0654 10/16/22 0757  TROPONINIHS 6 6 6 7       Cardiac EnzymesNo results for input(s):  "TROPONINI" in the last 168 hours. No results for input(s): "TROPIPOC" in the last 168 hours.   Chemistry Recent Labs  Lab 10/15/22 1502 10/16/22 0354 10/17/22 0437  NA 139 137 134*  K 3.2* 3.7 3.7  CL 107 105 103  CO2 19* 22 23  GLUCOSE 150* 90 125*  BUN 15 12 21   CREATININE 1.51* 1.23 1.26*  CALCIUM 8.9 8.7* 9.1  PROT 6.3*  --   --   ALBUMIN 3.5  --   --   AST 19  --   --   ALT 18  --   --   ALKPHOS 43  --   --   BILITOT 0.8  --   --   GFRNONAA 51* >60 >60  ANIONGAP 13 10 8      Hematology Recent Labs  Lab 10/15/22 1502 10/16/22 0354  WBC 4.0 4.5  RBC 5.35 5.23  HGB 15.2 15.1  HCT 46.7 45.4  MCV 87.3 86.8  MCH 28.4 28.9  MCHC 32.5 33.3  RDW 16.0* 15.8*  PLT 206 190    BNPNo results for input(s): "BNP", "PROBNP" in the last 168 hours.   DDimer No results for input(s): "DDIMER" in the last 168 hours.  Radiology    MR BRAIN WO CONTRAST  Result Date: 10/16/2022 CLINICAL DATA:  Neuro deficit, acute, stroke suspected. EXAM: MRI HEAD WITHOUT CONTRAST TECHNIQUE: Multiplanar, multiecho pulse sequences of the brain and surrounding structures were obtained without intravenous contrast. COMPARISON:  Head CT October 15, 2022. FINDINGS: Brain: No acute infarction, hemorrhage, hydrocephalus, extra-axial collection or mass lesion. Small amount of scattered foci of T2 hyperintensity within the white matter of the cerebral hemispheres, nonspecific. Vascular: Normal flow voids. Skull and upper cervical spine: Normal marrow signal. Sinuses/Orbits: Trace mucosal thickening of the right maxillary sinus and bilateral mastoids. The orbits are maintained. Other: A 9 mm T2 hyperintense left parotid gland nodule with layering T1 hyperintense/T2 hypointense content, suggesting blood products. IMPRESSION: 1. No acute intracranial abnormality. 2. Small amount of nonspecific T2 hyperintense lesions of the white matter, may represent early chronic microangiopathy. 3. A 9 mm left parotid nodule.  Non  urgent ENT consult suggested. Electronically Signed   By: Baldemar Lenis M.D.   On: 10/16/2022 15:13   ECHOCARDIOGRAM COMPLETE  Result Date: 10/16/2022    ECHOCARDIOGRAM REPORT   Patient Name:   Kristopher Scott Date of Exam: 10/16/2022 Medical Rec #:  [id number]         Height:       73.0 in Accession #:    [id number]        Weight:       253.5 lb Date of Birth:  1955/11/12         BSA:          2.380 m Patient Age:    66 years          BP:           124/79 mmHg Patient Gender: M                 HR:           67 bpm. Exam Location:  Inpatient Procedure: 2D Echo, Color Doppler and Cardiac Doppler Indications:    Syncope  History:        Patient has prior history of Echocardiogram examinations, most                 recent 01/03/2020. CHF, Pacemaker, Signs/Symptoms:Syncope; Risk                 Factors:Diabetes, Hypertension, Dyslipidemia and Sleep Apnea.  Sonographer:    Milbert Coulter Referring Phys: [id number] MARGARET E PRAY IMPRESSIONS  1. Left ventricular ejection fraction, by estimation, is 40 to 45%. The left ventricle has mildly decreased function. The left ventricle demonstrates global hypokinesis. There is mild concentric left ventricular hypertrophy. Left ventricular diastolic parameters are consistent with Grade I diastolic dysfunction (impaired relaxation).  2. Right ventricular systolic function is normal. The right ventricular size is normal.  3. Left atrial size was mildly dilated.  4. The mitral valve is normal in structure. Trivial mitral valve regurgitation. No evidence of mitral stenosis.  5. There is poor central copatation of the aortic valve with mild central AI. The aortic valve is tricuspid. There is mild calcification of the aortic valve. Aortic valve regurgitation is mild. Aortic valve sclerosis/calcification is present, without any evidence of aortic stenosis. Aortic regurgitation PHT measures 463 msec.  6. Aortic dilatation noted. There is mild dilatation of the ascending  aorta, measuring 43 mm. There is moderate dilatation of the aortic root, measuring 45 mm. FINDINGS  Left Ventricle: Left ventricular ejection fraction, by estimation, is 40  to 45%. The left ventricle has mildly decreased function. The left ventricle demonstrates global hypokinesis. The left ventricular internal cavity size was normal in size. There is  mild concentric left ventricular hypertrophy. Left ventricular diastolic parameters are consistent with Grade I diastolic dysfunction (impaired relaxation). Right Ventricle: The right ventricular size is normal. No increase in right ventricular wall thickness. Right ventricular systolic function is normal. Left Atrium: Left atrial size was mildly dilated. Right Atrium: Right atrial size was normal in size. Pericardium: There is no evidence of pericardial effusion. Mitral Valve: The mitral valve is normal in structure. Trivial mitral valve regurgitation. No evidence of mitral valve stenosis. Tricuspid Valve: The tricuspid valve is normal in structure. Tricuspid valve regurgitation is not demonstrated. No evidence of tricuspid stenosis. Aortic Valve: There is poor central copatation of the aortic valve with mild central AI. The aortic valve is tricuspid. There is mild calcification of the aortic valve. Aortic valve regurgitation is mild. Aortic regurgitation PHT measures 463 msec. Aortic valve sclerosis/calcification is present, without any evidence of aortic stenosis. Aortic valve mean gradient measures 4.0 mmHg. Aortic valve peak gradient measures 8.6 mmHg. Aortic valve area, by VTI measures 3.63 cm. Pulmonic Valve: The pulmonic valve was normal in structure. Pulmonic valve regurgitation is trivial. No evidence of pulmonic stenosis. Aorta: Aortic dilatation noted. There is mild dilatation of the ascending aorta, measuring 43 mm. There is moderate dilatation of the aortic root, measuring 45 mm. IAS/Shunts: No atrial level shunt detected by color flow Doppler.  Additional Comments: A device lead is visualized.  LEFT VENTRICLE PLAX 2D LVIDd:         5.50 cm      Diastology LVIDs:         4.00 cm      LV e' medial:    4.46 cm/s LV PW:         1.20 cm      LV E/e' medial:  13.2 LV IVS:        1.30 cm      LV e' lateral:   6.85 cm/s LVOT diam:     2.20 cm      LV E/e' lateral: 8.6 LV SV:         97 LV SV Index:   41 LVOT Area:     3.80 cm  LV Volumes (MOD) LV vol d, MOD A2C: 115.0 ml LV vol d, MOD A4C: 112.0 ml LV vol s, MOD A2C: 40.0 ml LV vol s, MOD A4C: 57.0 ml LV SV MOD A2C:     75.0 ml LV SV MOD A4C:     112.0 ml LV SV MOD BP:      68.2 ml RIGHT VENTRICLE             IVC RV Basal diam:  2.60 cm     IVC diam: 1.30 cm RV Mid diam:    2.40 cm RV S prime:     13.90 cm/s TAPSE (M-mode): 1.9 cm LEFT ATRIUM             Index        RIGHT ATRIUM           Index LA diam:        4.10 cm 1.72 cm/m   RA Area:     11.40 cm LA Vol (A2C):   42.9 ml 18.03 ml/m  RA Volume:   23.30 ml  9.79 ml/m LA Vol (A4C):   41.8 ml 17.56 ml/m LA Biplane  Vol: 46.6 ml 19.58 ml/m  AORTIC VALVE AV Area (Vmax):    3.21 cm AV Area (Vmean):   3.16 cm AV Area (VTI):     3.63 cm AV Vmax:           147.00 cm/s AV Vmean:          95.500 cm/s AV VTI:            0.267 m AV Peak Grad:      8.6 mmHg AV Mean Grad:      4.0 mmHg LVOT Vmax:         124.00 cm/s LVOT Vmean:        79.300 cm/s LVOT VTI:          0.255 m LVOT/AV VTI ratio: 0.96 AI PHT:            463 msec  AORTA Ao Root diam: 4.50 cm Ao Asc diam:  4.30 cm MITRAL VALVE               TRICUSPID VALVE MV Area (PHT): 3.50 cm    TR Peak grad:   20.4 mmHg MV Decel Time: 217 msec    TR Vmax:        226.00 cm/s MV E velocity: 59.00 cm/s MV A velocity: 92.70 cm/s  SHUNTS MV E/A ratio:  0.64        Systemic VTI:  0.26 m                            Systemic Diam: 2.20 cm Arvilla Meres MD Electronically signed by Arvilla Meres MD Signature Date/Time: 10/16/2022/11:15:11 AM    Final    EEG adult  Result Date: 10/16/2022 Charlsie Quest, MD      10/16/2022  8:45 AM Patient Name: Kristopher Scott MRN: [id number] Epilepsy Attending: Charlsie Quest Referring Physician/Provider: Celine Mans, MD Date: 10/16/2022 Duration: 25.49 mins Patient history: 67yo M with ams and syncope getting eeg to evaluate for seizure. Level of alertness: Awake, asleep AEDs during EEG study: None Technical aspects: This EEG study was done with scalp electrodes positioned according to the 10-20 International system of electrode placement. Electrical activity was reviewed with band pass filter of 1-70Hz , sensitivity of 7 uV/mm, display speed of 66mm/sec with a 60Hz  notched filter applied as appropriate. EEG data were recorded continuously and digitally stored.  Video monitoring was available and reviewed as appropriate. Description: The posterior dominant rhythm consists of 8 Hz activity of moderate voltage (25-35 uV) seen predominantly in posterior head regions, symmetric and reactive to eye opening and eye closing. Sleep was characterized by vertex waves, sleep spindles (12 to 14 Hz), maximal frontocentral region. Physiologic photic driving was not seen during photic stimulation. Hyperventilation was not performed.   IMPRESSION: This study is within normal limits. No seizures or epileptiform discharges were seen throughout the recording. A normal interictal EEG does not exclude the diagnosis of epilepsy. Charlsie Quest   CT Angio Chest PE W/Cm &/Or Wo Cm  Result Date: 10/15/2022 CLINICAL DATA:  Pulmonary embolism suspected. Altered mental status. EXAM: CT ANGIOGRAPHY CHEST WITH CONTRAST TECHNIQUE: Multidetector CT imaging of the chest was performed using the standard protocol during bolus administration of intravenous contrast. Multiplanar CT image reconstructions and MIPs were obtained to evaluate the vascular anatomy. RADIATION DOSE REDUCTION: This exam was performed according to the departmental dose-optimization program which includes automated exposure control,  adjustment of the mA and/or kV according  to patient size and/or use of iterative reconstruction technique. CONTRAST:  75mL OMNIPAQUE IOHEXOL 350 MG/ML SOLN COMPARISON:  Chest radiograph of earlier today. CTA chest 01/01/2020 scratched that CT of the chest of 05/15/2022 from [facility] FINDINGS: Cardiovascular: The quality of this exam for evaluation of pulmonary embolism is good. No evidence of pulmonary embolism. Pacer/ICD. Aortic atherosclerosis. Borderline cardiomegaly, without pericardial effusion. Pulmonary artery enlargement, outflow tract 3.1 cm. Ascending aortic dilatation. 5.5 cm at the sinuses. 4.1 cm at the sinotubular junction. 5.2 cm in the mid ascending segment. Relatively similar to 05/15/2022 when remeasured. Bovine arch. Mediastinum/Nodes: No mediastinal or hilar adenopathy. Lungs/Pleura: No pleural fluid. 3 mm right lower lobe pulmonary nodule is similar on the prior. Can be presumed benign and do/does not warrant imaging follow-up per Fleischner criteria. Upper Abdomen: Normal imaged portions of the liver, spleen, stomach, pancreas, adrenal glands, gallbladder. Incompletely imaged upper pole right renal 6.8 cm fluid density lesion is a cyst on prior abdominal CT . In the absence of clinically indicated signs/symptoms require(s) no independent follow-up. Musculoskeletal: Mild to moderate bilateral gynecomastia. Right rotator cuff repair. No acute osseous abnormality. Review of the MIP images confirms the above findings. IMPRESSION: 1.  No evidence of pulmonary embolism. 2. Similar ascending aortic dilatation at maximally 5.3 cm. Recommend semi-annual imaging followup by CTA or MRA and referral to cardiothoracic surgery if not already obtained. This recommendation follows 2010 ACCF/AHA/AATS/ACR/ASA/SCA/SCAI/SIR/STS/SVM Guidelines for the Diagnosis and Management of Patients With Thoracic Aortic Disease. Circulation. 2010; 121: E266-e369TAA. Aortic aneurysm NOS (ICD10-I71.9) 3. Pulmonary  artery enlargement suggests pulmonary arterial hypertension. Electronically Signed   By: Jeronimo Greaves M.D.   On: 10/15/2022 17:09   DG Chest Port 1 View  Result Date: 10/15/2022 CLINICAL DATA:  Altered mental status EXAM: PORTABLE CHEST 1 VIEW COMPARISON:  02/01/2022 FINDINGS: Transverse diameter of heart is slightly increased. Thoracic aorta is ectatic. Lung fields are clear of any infiltrates or pulmonary edema. There is no pleural effusion or pneumothorax. Pacemaker/defibrillator battery is seen in left infraclavicular region. Biventricular pacer leads are noted in place. IMPRESSION: There are no new infiltrates or signs of pulmonary edema. Electronically Signed   By: Ernie Avena M.D.   On: 10/15/2022 15:50   CT Head Wo Contrast  Result Date: 10/15/2022 CLINICAL DATA:  Head trauma, abnormal mental status (Age 37-64y) EXAM: CT HEAD WITHOUT CONTRAST TECHNIQUE: Contiguous axial images were obtained from the base of the skull through the vertex without intravenous contrast. RADIATION DOSE REDUCTION: This exam was performed according to the departmental dose-optimization program which includes automated exposure control, adjustment of the mA and/or kV according to patient size and/or use of iterative reconstruction technique. COMPARISON:  CT Head 04/21/20 FINDINGS: Brain: No evidence of acute infarction, hemorrhage, hydrocephalus, extra-axial collection or mass lesion/mass effect. Sequela of moderate chronic microvascular ischemic change. Vascular: No hyperdense vessel or unexpected calcification. Skull: Normal. Negative for fracture or focal lesion. Sinuses/Orbits: No middle ear or mastoid effusion. Paranasal sinuses are clear. Orbits are unremarkable. Other: None. IMPRESSION: No acute intracranial abnormality. Electronically Signed   By: Lorenza Cambridge M.D.   On: 10/15/2022 15:38    Cardiac Studies   2d echo 10/16/22   1. Left ventricular ejection fraction, by estimation, is 40 to 45%. The  left  ventricle has mildly decreased function. The left ventricle  demonstrates global hypokinesis. There is mild concentric left ventricular  hypertrophy. Left ventricular diastolic  parameters are consistent with Grade I diastolic dysfunction (impaired  relaxation).   2. Right  ventricular systolic function is normal. The right ventricular  size is normal.   3. Left atrial size was mildly dilated.   4. The mitral valve is normal in structure. Trivial mitral valve  regurgitation. No evidence of mitral stenosis.   5. There is poor central copatation of the aortic valve with mild central  AI. The aortic valve is tricuspid. There is mild calcification of the  aortic valve. Aortic valve regurgitation is mild. Aortic valve  sclerosis/calcification is present, without  any evidence of aortic stenosis. Aortic regurgitation PHT measures 463  msec.   6. Aortic dilatation noted. There is mild dilatation of the ascending  aorta, measuring 43 mm. There is moderate dilatation of the aortic root,  measuring 45 mm.   Patient Profile     67 y.o. male with a hx of chronic HFrEF 40-45% s/p MDT CRT-D 07/2022 followed at the [place] with original ICD placed 2014 when EF reported to be 20-25%, previous patient reported clear cath, PAF on apixaban, HTN, HLD, DM, CKD 3a by labs who is being seen 10/15/2022 for the evaluation of syncope   Assessment & Plan    1. Syncope: Uncertain etiology of his syncopal event, but have highest suspicion for vasovagal syncope vs TIA. Lower suspicion for malignant arrhythmia, especially given MDT CRT-D interrogation in the ED today showing no arrhythmia events with a VT monitoring zone set >150 bpm. Normal orthostatics.  Echo shows EF is stable at 40 to 45%, normal RV function, mild aortic regurgitation, dilation of aorta further outlined below - telemetry unrevealing except for 5 beats NSVT - will defer lyte mgmt to primary team but recommend keeping K 4.0 or Mg 2.0 and greater - Hold lasix  for now - suspected dry on admission with mild AKI superimposed on what looks to be CKD stage 3a.  Plan to discharge on as needed Lasix; recommend monitoring daily weights and can take Lasix if gains more than 3 pounds in 1 day or 5 pounds in 1 week - Brain MRI without acute abnormality, possible chronic microangiopathy, 9mm left parotid nodule -> recommended for non urgent ENT consult, defer to primary team to coordinate -Needs to be free of syncope x 6 months before driving per [facility] recommendations  2. Chronic systolic heart failure: EF 40-45% on most recent echo.  Stable on echo this admission - Suspect dry on admission, lasix held.  Normal orthostatics as above - Continued on carvedilol 3.125 mg BID, home Imdur 60mg  daily - Home Entresto restarted 6/13 - home med list includes empagliflozin/metformin combo + epleronone, okay to resume on discharge but will change Lasix to as needed as above   3. Paroxysmal atrial fibrillation - continue carvedilol, Eliquis   4. Thoracic aortic aneurysm - mild ascending aorta dilation and moderate dilation of aortic root by echo, but CTA 10/15/22 remarking 5.3cm ascending aortic dilation maximally in impression (Ascending aortic dilatation. 5.5 cm at the sinuses. 4.1 cm at the sinotubular junction. 5.2 cm in the mid ascending segment. Relatively similar to 05/15/2022 when remeasured) .  Recommend referral to cardiothoracic surgery; discussed with patient, he declines referral at this time wants to talk with his cardiologist at the [facility] - continue BB - discussed informing first degree relatives of dx, avoiding fluoroquinolones, and lifting over 30lb  [facility] will sign off.   Medication Recommendations: Continue home heart failure medications but would switch Lasix to as needed as above.  Would monitor daily weights and can take Lasix if gains  more than 3 pounds in 1 day or 5 pounds 1 week Other recommendations (labs, testing, etc):  None Follow up as an outpatient: Should arrange follow-up with his cardiologist at [facility]   For questions or updates, please contact [facility] Please consult [email or web address] for contact info under Cardiology/STEMI.  Signed, Laurann Montana, PA-C 10/17/2022, 9:26 AM    Patient seen and examined.  Agree with above documentation.  On exam, patient is alert and oriented, regular rate and rhythm, no murmurs, lungs CTAB, no LE edema or JVD.  Echo shows stable mild systolic dysfunction, EF 40 to 45%.  No significant arrhythmias on telemetry, did have brief NSVT.  He has ICD.  Suspect he was dry on admission, will change Lasix to as needed.  Does have thoracic aortic aneurysm that is close to meeting criteria for repair, would refer to cardiothoracic surgery as outpatient; patient declines referral at this time, wants to contact to his cardiologist at the [facility], [place]

## 2022-10-17 NOTE — Assessment & Plan Note (Addendum)
Unclear etiology, most likely vasovagal given evidence thus far.  Currently stable.  Orthostatic vital signs WNL.  EEG and CT head unremarkable.  MRI without acute infarct.  Patient is fully alert and oriented but has some difficulty with word finding, although his baseline mental status is unclear. -Appreciate cardiology -Continue telemetry -Attempt to obtain collateral information from family members

## 2022-10-17 NOTE — Discharge Instructions (Addendum)
Dear Isaiah Blakes,  Thank you for letting us participate in your care. You were hospitalized for Syncope. You received several imaging studies which did not show an obvious source of your symptoms. Please be sure to follow up with your primary care doctor, who may need to initiate further workup based on the results of the scans. We will communicate these recommendations in our documentation.   POST-HOSPITAL & CARE INSTRUCTIONS Please review your updated medication list for changes - some of your medications are prescribed differently. Go to your follow up appointments (listed below)   DOCTOR'S APPOINTMENT   Future Appointments  Date Time Provider Department Center  11/03/2022 11:00 AM [facility] None    Follow-up Information     [facility] Follow up.   Specialty: Home Health Services Why: The home health agency will contact you for the first home visit. Contact information: [address] [place] [address] [phone]         [facility] Clinics. Go in 17 day(s).   Specialty: Cardiology Why: Hospital follow up 11/03/2022 @ 11 am PLEASE bring a current meducation list to appointment FREE valet parking, Entrance C, off [employer] information: [address] [id number] mc [place] [address] [phone]        [facility] Schedule an appointment as soon as possible for a visit.   Contact information: [address] [facility] [place] [address] [phone]                 Take care and be well!  Family Medicine Teaching Service Inpatient Team [place]  [facility]  [address] [place], [place] [address] [phone]

## 2022-10-17 NOTE — Discharge Summary (Addendum)
Family Medicine Teaching [facility] Discharge Summary  Patient name: Kristopher Scott Medical record number: [id number] Date of birth: 08-Feb-1956 Age: 67 y.o. Gender: male Date of Admission: 10/15/2022  Date of Discharge: 10/17/22 Admitting Physician: Celine Mans, MD  Primary Care Provider: [facility] Consultants: Cardiology  Indication for Hospitalization: syncope  Discharge Diagnoses/Problem List:  Principal Problem for Admission: syncope Other Problems addressed during stay:  Principal Problem:   Syncope Active Problems:   HFrEF (heart failure with reduced ejection fraction) (HCC)   T2DM (type 2 diabetes mellitus) (HCC)   Chronic systolic heart failure [facility])    Brief Hospital Course:  Kristopher Scott is a 67 y.o.male with a history of afib, HFrEF, pacemaker placement, GERD, HLD, gout, arthritis, T2DM, headaches who was admitted to the [facility] Teaching Service at [facility] for syncopal episode. His hospital course is detailed below:  Syncope Presented after episode of syncope with temporary word finding difficulty/AMS, workup overall was unrevealing.  Possibly vasovagal versus medication induced versus heat stroke versus TIA versus atypical seizure versus psychogenic.  CT head unremarkable, EEG unremarkable, MRI brain with possible chronic microangiopathy but no acute abnormality.  Pacemaker interrogated, not found to have any events.  Echo showed stable EF compared to prior although he was noted to have dilation of ascending aorta on CTA/echo. Cardiology was consulted, changed his Lasix to as needed and other changes as below. Discussed case with on-call neurologist Dr. Derry Lory, felt that this was unlikely to be TIA and since he is already on Eliquis, no need for ASA/Plavix.  May consider outpatient neurology follow-up for long-term EEG but this was not performed during admission as the patient did not have repeat episodes.  Upon discharge his word finding  difficulty resolved and he was at his baseline mental status.   CHF Seen by cardiology due to his history of CHF and pacemaker placement.  Echo with stable EF 40 to 45%.  Patient was continued on carvedilol 3.125 mg twice daily, home Imdur 60 mg daily, Entresto.  Resumed empagliflozin/metformin combination and eplerenone on discharge.  Continued Coreg and Eliquis for A-fib.   Other chronic conditions were medically managed with home medications and formulary alternatives as necessary  PCP Follow-up Recommendations:  Issues for follow up Patient will need to be free of syncope x 6 months before driving per [place] DMV recommendations Medication changes: Lasix changed to as needed, Coreg 3.125 twice daily Incidental 9mm left parotid nodule noted on MR Brain - Recommend ENT f/u outpatient Thoracic aortic aneurysm noted on CTA (5.3cm) - recommend follow up with cardiothoracic surgery Consider outpatient referral to neurology for further evaluation of recurrent syncopal episodes F/u TSH, B12 panel   Imaging findings:  CT Head WO contrast IMPRESSION: No acute intracranial abnormality.   DG Chest  IMPRESSION: There are no new infiltrates or signs of pulmonary edema.  CTA PE IMPRESSION: 1.  No evidence of pulmonary embolism. 2. Similar ascending aortic dilatation at maximally 5.3 cm. Recommend semi-annual imaging followup by CTA or MRA and referral to cardiothoracic surgery if not already obtained. This recommendation follows 2010 ACCF/AHA/AATS/ACR/ASA/SCA/SCAI/SIR/STS/SVM Guidelines for the Diagnosis and Management of Patients With Thoracic Aortic Disease. Circulation. 2010; 121: E266-e369TAA. Aortic aneurysm NOS (ICD10-I71.9) 3. Pulmonary artery enlargement suggests pulmonary arterial hypertension.  MR Brain IMPRESSION: 1. No acute intracranial abnormality. 2. Small amount of nonspecific T2 hyperintense lesions of the white matter, may represent early chronic microangiopathy. 3.  A 9 mm left parotid nodule.  Non urgent ENT consult suggested.  EEG  IMPRESSION: This study is within normal limits. No seizures or epileptiform discharges were seen throughout the recording.   A normal interictal EEG does not exclude the diagnosis of epilepsy.    Disposition: home w/ home health  Discharge Condition: stable, improved    Discharge Exam:  Vitals:   10/17/22 0826 10/17/22 1154  BP: 129/84 117/75  Pulse: 66 77  Resp: 19 19  Temp: 97.8 F (36.6 C) 98.6 F (37 C)  SpO2: 100% 100%   Gen: NAD, sitting in chair CV: RRR no murmurs Pulm: CTAB normal WOB on RA Neuro: No gross deficits, speech coherent MSK: No significant edema  Significant Procedures: na  Significant Labs and Imaging:  Recent Labs  Lab 10/15/22 1502 10/16/22 0354  WBC 4.0 4.5  HGB 15.2 15.1  HCT 46.7 45.4  PLT 206 190   Recent Labs  Lab 10/15/22 1502 10/16/22 0354 10/17/22 0437  NA 139 137 134*  K 3.2* 3.7 3.7  CL 107 105 103  CO2 19* 22 23  GLUCOSE 150* 90 125*  BUN 15 12 21   CREATININE 1.51* 1.23 1.26*  CALCIUM 8.9 8.7* 9.1  MG 2.0  --   --   ALKPHOS 43  --   --   AST 19  --   --   ALT 18  --   --   ALBUMIN 3.5  --   --    Lipid Panel     Component Value Date/Time   CHOL 168 10/17/2022 1106   TRIG 142 10/17/2022 1106   HDL 56 10/17/2022 1106   CHOLHDL 3.0 10/17/2022 1106   VLDL 28 10/17/2022 1106   LDLCALC 84 10/17/2022 1106    See hospital course for imaging  Results/Tests Pending at Time of Discharge: B12 level, TSH  Discharge Medications:  Allergies as of 10/17/2022       Reactions   Bee Venom Anaphylaxis, Shortness Of Breath   Spironolactone Other (See Comments)   Gynecomastia Cardiomyopathy   Lisinopril Cough   Propranolol Itching, Cough        Medication List     STOP taking these medications    atorvastatin 80 MG tablet Commonly known as: LIPITOR   clonazePAM 0.5 MG tablet Commonly known as: KLONOPIN   HYDROcodone-acetaminophen  5-325 MG tablet Commonly known as: NORCO/VICODIN   pregabalin 25 MG capsule Commonly known as: LYRICA       TAKE these medications    acetaminophen 325 MG tablet Commonly known as: TYLENOL Take 2 tablets (650 mg total) by mouth every 6 (six) hours as needed for moderate pain.   allopurinol 300 MG tablet Commonly known as: ZYLOPRIM Take 300 mg by mouth daily.   carvedilol 3.125 MG tablet Commonly known as: COREG Take 1 tablet (3.125 mg total) by mouth 2 (two) times daily with a meal. What changed:  medication strength how much to take   clobetasol cream 0.05 % Commonly known as: TEMOVATE Apply 1 Application topically as needed. Apply to hard itchy spot on the leg   clotrimazole 1 % external solution Commonly known as: LOTRIMIN Apply 1 Application topically 2 (two) times daily as needed. Apply small amount to affected fungal toenails   cyanocobalamin 500 MCG tablet Commonly known as: VITAMIN B12 Take 1,000 mcg by mouth daily.   ELIQUIS PO Take 5 mg by mouth 2 (two) times daily.   Empagliflozin-metFORMIN HCl 12.09-998 MG Tabs Take 1 tablet by mouth in the morning and at bedtime.   eplerenone 25 MG  tablet Commonly known as: INSPRA Take 25 mg by mouth daily.   ferrous sulfate 325 (65 FE) MG tablet Take 325 mg by mouth daily with breakfast.   finasteride 5 MG tablet Commonly known as: PROSCAR Take 5 mg by mouth daily.   furosemide 20 MG tablet Commonly known as: LASIX Take 1 tablet (20 mg total) by mouth daily as needed (take if you gain more than 3 pounds in 1 day or 5 pounds in 1 week). What changed:  when to take this reasons to take this   Galcanezumab-gnlm 100 MG/ML Sosy Inject 300 mg into the skin every 30 (thirty) days. Emgality   isosorbide mononitrate 60 MG 24 hr tablet Commonly known as: IMDUR Take 60 mg by mouth daily.   lidocaine 5 % Commonly known as: LIDODERM Place 1 patch onto the skin daily as needed (for pain).   Magnesium Oxide 420  MG Tabs Take 420 mg by mouth daily.   nitroGLYCERIN 0.4 MG SL tablet Commonly known as: NITROSTAT Place 0.4 mg under the tongue every 5 (five) minutes as needed for chest pain.   pantoprazole 40 MG tablet Commonly known as: PROTONIX Take 40 mg by mouth daily.   prazosin 5 MG capsule Commonly known as: MINIPRESS Take 5 mg by mouth at bedtime.   rosuvastatin 40 MG tablet Commonly known as: CRESTOR Take 40 mg by mouth daily.   sacubitril-valsartan 24-26 MG Commonly known as: ENTRESTO Take 1 tablet by mouth 2 (two) times daily.   Semaglutide(0.25 or 0.5MG /DOS) 2 MG/3ML Sopn Inject 0.5 mg into the skin once a week. Tuesday   tamsulosin 0.4 MG Caps capsule Commonly known as: FLOMAX Take 0.4 mg by mouth at bedtime.               Durable Medical Equipment  (From admission, onward)           Start     Ordered   10/16/22 1000  For home use only DME Walker rolling  Once       Question Answer Comment  Walker: With 5 Inch Wheels   Patient needs a walker to treat with the following condition General weakness      10/16/22 0959            Discharge Instructions: Please refer to Patient Instructions section of EMR for full details.  Patient was counseled important signs and symptoms that should prompt return to medical care, changes in medications, dietary instructions, activity restrictions, and follow up appointments.   Follow-Up Appointments:  Follow-up Information     [facility] Follow up.   Specialty: Home Health Services Why: The home health agency will contact you for the first home visit. Contact information: [address] [place] [address] [phone]         [facility] Specialty Clinics. Go in 17 day(s).   Specialty: Cardiology Why: Hospital follow up 11/03/2022 @ 11 am PLEASE bring a current meducation list to appointment FREE valet parking, Entrance C, off [employer]  information: [address] [id number] mc [place] [address] [phone]        [facility] Schedule an appointment as soon as possible for a visit.   Contact information: [address] [place] [address] [phone]                 Vonna Drafts, MD 10/17/2022, 12:40 PM PGY-1, [facility] Family Medicine

## 2022-10-17 NOTE — Progress Notes (Signed)
Heart Failure Nurse Navigator Progress Note  PCP: [facility] PCP-Cardiologist: At [facility] Admission Diagnosis: None Admitted from: Home via EMS  Presentation:   Isaiah Blakes presented after becoming unresponsive at home, headache, had been lethargic and slow to respond while with his brother and nephew. Patient has a ICD in place , a pacemaker interrogation showed no events and trop - 6. BP 121/90, HR 80, Brain MRI without acute abnormality.   Patient was educated on the sign and symptoms of heart failure, daily weights, when to call his doctor or go to the ED, Diet/ fluid restrictions, taking all medications as prescribed and attending all medical appointments, patient verbalized his understanding of the education, a HF TOC appointment was scheduled for 11/03/2022 @ 11 am.   ECHO/ LVEF: 40-45% HFrEF  Clinical Course:  Past Medical History:  Diagnosis Date   Anxiety    Arthritis    Atrial fibrillation (HCC)    Back pain    ESI/TENS/therapy   Cardiac defibrillator in place 12/2014   Cluster headaches    Congestive heart failure (CHF) (HCC)    Depression    Diabetes mellitus without complication (HCC)    GERD (gastroesophageal reflux disease)    Hyperlipidemia    Hypertension    Pinched vertebral nerve    pain/burning from left thigh to left foot at times   Sleep apnea      Social History   Socioeconomic History   Marital status: Married    Spouse name: Not on file   Number of children: Not on file   Years of education: Not on file   Highest education level: Not on file  Occupational History   Not on file  Tobacco Use   Smoking status: Never   Smokeless tobacco: Never  Vaping Use   Vaping Use: Never used  Substance and Sexual Activity   Alcohol use: Yes    Comment: socially   Drug use: No   Sexual activity: Yes  Other Topics Concern   Not on file  Social History Narrative   Not on file   Social Determinants of Health   Financial Resource Strain:  Not on file  Food Insecurity: No Food Insecurity (10/15/2022)   Hunger Vital Sign    Worried About Running Out of Food in the Last Year: Never true    Ran Out of Food in the Last Year: Never true  Transportation Needs: Unmet Transportation Needs (10/15/2022)   PRAPARE - Administrator, Civil Service (Medical): Yes    Lack of Transportation (Non-Medical): No  Physical Activity: Not on file  Stress: Not on file  Social Connections: Not on file   Education Assessment and Provision:  Detailed education and instructions provided on heart failure disease management including the following:  Signs and symptoms of Heart Failure When to call the physician Importance of daily weights Low sodium diet Fluid restriction Medication management Anticipated future follow-up appointments  Patient education given on each of the above topics.  Patient acknowledges understanding via teach back method and acceptance of all instructions.  Education Materials:  "Living Better With Heart Failure" Booklet, HF zone tool, & Daily Weight Tracker Tool.  Patient has scale at home: Yes Patient has pill box at home: yes    High Risk Criteria for Readmission and/or Poor Patient Outcomes: Heart failure hospital admissions (last 6 months): 0  No Show rate: 0 Difficult social situation: No, lives with Wife Demonstrates medication adherence: Yes Primary Language: English Literacy  level: Reading, writing, and comprehension  Barriers of Care:   Diet/ fluid restrictions ( soda/ salt) Daily weights  Considerations/Referrals:   Referral made to Heart Failure Pharmacist Stewardship: No Referral made to Heart Failure CSW/NCM TOC: No Referral made to Heart & Vascular TOC clinic: Yes, 11/03/2022 @ 11 am  Items for Follow-up on DC/TOC: Continued HF education Diet/ fluid restrictions ( salt/ soda) Daily weights   Rhae Hammock, BSN, RN Heart Failure Teacher, adult education Only

## 2022-10-17 NOTE — Assessment & Plan Note (Signed)
HFrEF 40-45% s/p pacemaker implantation on 07/2022. Appears euvolemic on exam. No events on pacemaker interrogation. Evidence of pulmonary artery dilation on CTA PE.  Repeat echo with stable EF, G1 DD, mild aortic regurgitation, dilated aortic root. -Cardiology consulted, recs appreciated -Continue home Carvedilol -Hold home Lasix -Home Entresto  -Continue statin

## 2022-11-03 ENCOUNTER — Ambulatory Visit (HOSPITAL_COMMUNITY)
Admit: 2022-11-03 | Discharge: 2022-11-03 | Disposition: A | Discharge status: Home / Self Care | Payer: Medicare HMO | Attending: Family Medicine | Admitting: Family Medicine

## 2022-11-03 ENCOUNTER — Encounter (HOSPITAL_COMMUNITY): Payer: Self-pay

## 2022-11-03 VITALS — BP 120/88 | HR 84 | Wt 235.2 lb

## 2022-11-03 DIAGNOSIS — Z95810 Presence of automatic (implantable) cardiac defibrillator: Secondary | ICD-10-CM | POA: Insufficient documentation

## 2022-11-03 DIAGNOSIS — Z794 Long term (current) use of insulin: Secondary | ICD-10-CM

## 2022-11-03 DIAGNOSIS — G901 Familial dysautonomia [Riley-Day]: Secondary | ICD-10-CM | POA: Diagnosis not present

## 2022-11-03 DIAGNOSIS — E1122 Type 2 diabetes mellitus with diabetic chronic kidney disease: Secondary | ICD-10-CM | POA: Insufficient documentation

## 2022-11-03 DIAGNOSIS — I4891 Unspecified atrial fibrillation: Secondary | ICD-10-CM | POA: Diagnosis not present

## 2022-11-03 DIAGNOSIS — E1140 Type 2 diabetes mellitus with diabetic neuropathy, unspecified: Secondary | ICD-10-CM | POA: Insufficient documentation

## 2022-11-03 DIAGNOSIS — I2510 Atherosclerotic heart disease of native coronary artery without angina pectoris: Secondary | ICD-10-CM | POA: Diagnosis not present

## 2022-11-03 DIAGNOSIS — I5022 Chronic systolic (congestive) heart failure: Secondary | ICD-10-CM | POA: Diagnosis not present

## 2022-11-03 DIAGNOSIS — I428 Other cardiomyopathies: Secondary | ICD-10-CM | POA: Diagnosis not present

## 2022-11-03 DIAGNOSIS — I480 Paroxysmal atrial fibrillation: Secondary | ICD-10-CM

## 2022-11-03 DIAGNOSIS — I714 Abdominal aortic aneurysm, without rupture, unspecified: Secondary | ICD-10-CM | POA: Diagnosis not present

## 2022-11-03 DIAGNOSIS — Z79899 Other long term (current) drug therapy: Secondary | ICD-10-CM | POA: Insufficient documentation

## 2022-11-03 DIAGNOSIS — N1831 Chronic kidney disease, stage 3a: Secondary | ICD-10-CM | POA: Insufficient documentation

## 2022-11-03 DIAGNOSIS — Z7901 Long term (current) use of anticoagulants: Secondary | ICD-10-CM | POA: Diagnosis not present

## 2022-11-03 DIAGNOSIS — I130 Hypertensive heart and chronic kidney disease with heart failure and stage 1 through stage 4 chronic kidney disease, or unspecified chronic kidney disease: Secondary | ICD-10-CM | POA: Diagnosis present

## 2022-11-03 DIAGNOSIS — E785 Hyperlipidemia, unspecified: Secondary | ICD-10-CM | POA: Insufficient documentation

## 2022-11-03 DIAGNOSIS — R55 Syncope and collapse: Secondary | ICD-10-CM | POA: Diagnosis not present

## 2022-11-03 DIAGNOSIS — I10 Essential (primary) hypertension: Secondary | ICD-10-CM

## 2022-11-03 LAB — COMPREHENSIVE METABOLIC PANEL
ALT: 21 U/L (ref 0–44)
AST: 19 U/L (ref 15–41)
Albumin: 3.9 g/dL (ref 3.5–5.0)
Alkaline Phosphatase: 49 U/L (ref 38–126)
Anion gap: 7 (ref 5–15)
BUN: 12 mg/dL (ref 8–23)
CO2: 27 mmol/L (ref 22–32)
Calcium: 9.5 mg/dL (ref 8.9–10.3)
Chloride: 105 mmol/L (ref 98–111)
Creatinine, Ser: 1.17 mg/dL (ref 0.61–1.24)
GFR, Estimated: >60 mL/min (ref >60)
Glucose, Bld: 108 mg/dL — ABNORMAL HIGH (ref 70–99)
Potassium: 3.9 mmol/L (ref 3.5–5.1)
Sodium: 139 mmol/L (ref 135–145)
Total Bilirubin: 1.2 mg/dL (ref 0.3–1.2)
Total Protein: 6.9 g/dL (ref 6.5–8.1)

## 2022-11-03 LAB — CBC
HCT: 47.3 % (ref 39.0–52.0)
Hemoglobin: 15.6 g/dL (ref 13.0–17.0)
MCH: 28.4 pg (ref 26.0–34.0)
MCHC: 33 g/dL (ref 30.0–36.0)
MCV: 86.2 fL (ref 80.0–100.0)
Platelets: 203 10*3/uL (ref 150–400)
RBC: 5.49 MIL/uL (ref 4.22–5.81)
RDW: 15.3 % (ref 11.5–15.5)
WBC: 5.6 10*3/uL (ref 4.0–10.5)
nRBC: 0 % (ref 0.0–0.2)

## 2022-11-03 LAB — BRAIN NATRIURETIC PEPTIDE: B Natriuretic Peptide: 12 pg/mL (ref 0.0–100.0)

## 2022-11-03 MED ORDER — FUROSEMIDE 20 MG PO TABS: 20.0000 mg | ORAL_TABLET | Freq: Every day | ORAL | 0 refills | Status: DC | PRN

## 2022-11-03 MED ORDER — FUROSEMIDE 20 MG PO TABS: 20.0000 mg | ORAL_TABLET | Freq: Every day | ORAL | 0 refills | Status: AC

## 2022-11-03 NOTE — Addendum Note (Signed)
Encounter addended by: Jacklynn Ganong, FNP on: 11/03/2022 4:25 PM  Actions taken: Clinical Note Signed

## 2022-11-03 NOTE — Progress Notes (Addendum)
HEART & VASCULAR TRANSITION OF CARE CONSULT NOTE    Referring Physician: Dr. Barb Merino [facility]: [facility]: Dr. Lowella Bandy ([facility]) Primary Cardiologist: Dr Elaina Pattee ([facility])  HPI: Referred to clinic by Dr. Barb Merino for heart failure consultation.   Kristopher Scott is a 67 y.o. with a past history of hx of HFrEF 40-45%, s/p MDT CRT-D 2016, AF on apixaban, HTN, HLD, DM.  His LVEF in 2014 was 20-25% (per his report) so presumably ICD placed for primary prevention. ICD upgraded to CRT-D in 2016.  LHC (10/15 at [facility]): mild, non obstructive CAD, LHC (2/18 at [facility]): mild, non obstructive CAD, LVEDP 12  s/p lead revision (10/21) due to impinged nerve in left chest.   Echo (3/22) EF 45-50%, RV normal, mild AI  Admitted 6/24 with syncope. No arrhythmias on device interrogation, OptiVol suggested volume low. GDMT held. Echo showed EF 40 %-45%, normal RV function, mild aortic regurgitation, dilated aortic root measuring 45 mm.  He had slurred speech so MRI brain & EEG ordered. EEG unremarkable, and MRI brain without acute abnormality, possible chronic microangiopathy, 9mm left parotid nodule with recommendation of non urgent ENT consult. He was discharged home, weight 253 lbs.   Seen at the [facility] ED 10/27/22 with panic attack with cluster HA. Given IV headache cocktail and IVF, felt stable for discharge. Saw Neurology 6.28.24 and had LP for intractable cluster HA.  Today he presents to [facility] for post hospital follow up, with his wife. Overall feeling fine. He has SOB walking up inclines but otherwise no breathing issues. He has positional dizziness, struggles with balance and has neuropathy in feet. Of note, has right wrist pain/neuropathy. No further syncope. Denies palpitations, abnormal bleeding, CP, or PND/Orthopnea. Appetite ok. No fever or chills. Weight at home 233 pounds. Taking all medications, wife has been giving him Lasix daily. Gen Cards Dr. Brooke Dare at [facility].  Family Hx: Father with  CVA, brother PPM & CAD.  Cardiac Testing   - Echo (6/24): EF 40-45%, normal RV  - Echo (3/22 at [facility]): EF 45-50%, mild LVH, normal RV  - Echo (1/22 at [facility]): EF 40-45%, LVH  - LHC (2/18 at [facility]): mild, non-obs CAD  - LHC (10/15 at [facility]): mild, non obs CAD  - Echo (2014): EF 20-25%  Review of Systems: [y] = yes, [ ]  = no   General: Weight gain [ ] ; Weight loss [ ] ; Anorexia [ ] ; Fatigue [ ] ; Fever [ ] ; Chills [ ] ; Weakness [ ]   Cardiac: Chest pain/pressure [ ] ; Resting SOB [ ] ; Exertional SOB [y]; Orthopnea [ ] ; Pedal Edema [ ] ; Palpitations [ ] ; Syncope [ ] ; Presyncope [ ] ; Paroxysmal nocturnal dyspnea[ ]   Pulmonary: Cough [ ] ; Wheezing[ ] ; Hemoptysis[ ] ; Sputum [ ] ; Snoring [ ]   GI: Vomiting[ ] ; Dysphagia[ ] ; Melena[ ] ; Hematochezia [ ] ; Heartburn[ ] ; Abdominal pain [ ] ; Constipation [ ] ; Diarrhea [ ] ; BRBPR [ ]   GU: Hematuria[ ] ; Dysuria [ ] ; Nocturia[ ]   Vascular: Pain in legs with walking [ ] ; Pain in feet with lying flat [ ] ; Non-healing sores [ ] ; Stroke [ ] ; TIA Cove.Etienne ]; Slurred speech [ ] ;  Neuro: Headaches[y ]; Vertigo[ ] ; Seizures[ ] ; Paresthesias[ ] ;Blurred vision [ ] ; Diplopia [ ] ; Vision changes [ ]   Ortho/Skin: Arthritis [ ] ; Joint pain [ ] ; Muscle pain [ ] ; Joint swelling [ ] ; Back Pain Cove.Etienne ]; Rash [ ]   Psych: Depression[ ] ; Anxiety[y]  Heme: Bleeding problems [ ] ;  Clotting disorders [ ] ; Anemia [ ]   Endocrine: Diabetes [y]; Thyroid dysfunction[ ]   Past Medical History:  Diagnosis Date   Anxiety    Arthritis    Atrial fibrillation (HCC)    Back pain    ESI/TENS/therapy   Cardiac defibrillator in place 12/2014   Cluster headaches    Congestive heart failure (CHF) (HCC)    Depression    Diabetes mellitus without complication (HCC)    GERD (gastroesophageal reflux disease)    Hyperlipidemia    Hypertension    Pinched vertebral nerve    pain/burning from left thigh to left foot at times   Sleep apnea    Current Outpatient Medications   Medication Sig Dispense Refill   acetaminophen (TYLENOL) 325 MG tablet Take 2 tablets (650 mg total) by mouth every 6 (six) hours as needed for moderate pain.     allopurinol (ZYLOPRIM) 300 MG tablet Take 300 mg by mouth daily.     Apixaban (ELIQUIS PO) Take 5 mg by mouth 2 (two) times daily.      carvedilol (COREG) 3.125 MG tablet Take 1 tablet (3.125 mg total) by mouth 2 (two) times daily with a meal. 60 tablet 0   clobetasol cream (TEMOVATE) 0.05 % Apply 1 Application topically as needed. Apply to hard itchy spot on the leg     clotrimazole (LOTRIMIN) 1 % external solution Apply 1 Application topically 2 (two) times daily as needed. Apply small amount to affected fungal toenails     cyanocobalamin (VITAMIN B12) 500 MCG tablet Take 1,000 mcg by mouth daily.     Empagliflozin-metFORMIN HCl 12.09-998 MG TABS Take 1 tablet by mouth in the morning and at bedtime.     eplerenone (INSPRA) 25 MG tablet Take 25 mg by mouth daily.     ferrous sulfate 325 (65 FE) MG tablet Take 325 mg by mouth daily with breakfast.     finasteride (PROSCAR) 5 MG tablet Take 5 mg by mouth daily.     furosemide (LASIX) 20 MG tablet Take 1 tablet (20 mg total) by mouth daily as needed (take if you gain more than 3 pounds in 1 day or 5 pounds in 1 week). 30 tablet 2   Galcanezumab-gnlm 100 MG/ML SOSY Inject 300 mg into the skin every 30 (thirty) days. Emgality     isosorbide mononitrate (IMDUR) 60 MG 24 hr tablet Take 60 mg by mouth daily.      lidocaine (LIDODERM) 5 % Place 1 patch onto the skin daily as needed (for pain).     Magnesium Oxide 420 MG TABS Take 420 mg by mouth daily.     nitroGLYCERIN (NITROSTAT) 0.4 MG SL tablet Place 0.4 mg under the tongue every 5 (five) minutes as needed for chest pain.      pantoprazole (PROTONIX) 40 MG tablet Take 40 mg by mouth daily.     prazosin (MINIPRESS) 5 MG capsule Take 5 mg by mouth at bedtime.     rosuvastatin (CRESTOR) 40 MG tablet Take 40 mg by mouth daily.      sacubitril-valsartan (ENTRESTO) 24-26 MG Take 1 tablet by mouth 2 (two) times daily. 60 tablet 5   Semaglutide,0.25 or 0.5MG /DOS, 2 MG/3ML SOPN Inject 0.5 mg into the skin once a week. Tuesday     tamsulosin (FLOMAX) 0.4 MG CAPS capsule Take 0.4 mg by mouth at bedtime.     No current facility-administered medications for this encounter.   Allergies  Allergen Reactions   Bee Venom Anaphylaxis and  Shortness Of Breath   Spironolactone Other (See Comments)    Gynecomastia Cardiomyopathy   Lisinopril Cough   Propranolol Itching and Cough   Social History   Socioeconomic History   Marital status: Married    Spouse name: Myriam Jacobson   Number of children: 0   Years of education: Not on file   Highest education level: Bachelor's degree (e.g., BA, AB, BS)  Occupational History   Occupation: Not working,  Tobacco Use   Smoking status: Never   Smokeless tobacco: Never  Vaping Use   Vaping Use: Never used  Substance and Sexual Activity   Alcohol use: Yes    Comment: socially   Drug use: No   Sexual activity: Yes  Other Topics Concern   Not on file  Social History Narrative   Not on file   Social Determinants of Health   Financial Resource Strain: Low Risk  (10/17/2022)   Overall Financial Resource Strain (CARDIA)    Difficulty of Paying Living Expenses: Not hard at all  Food Insecurity: No Food Insecurity (10/15/2022)   Hunger Vital Sign    Worried About Running Out of Food in the Last Year: Never true    Ran Out of Food in the Last Year: Never true  Transportation Needs: No Transportation Needs (10/17/2022)   PRAPARE - Administrator, Civil Service (Medical): No    Lack of Transportation (Non-Medical): No  Recent Concern: Transportation Needs - Unmet Transportation Needs (10/15/2022)   PRAPARE - Administrator, Civil Service (Medical): Yes    Lack of Transportation (Non-Medical): No  Physical Activity: Not on file  Stress: Not on file  Social Connections:  Not on file  Intimate Partner Violence: Not At Risk (10/15/2022)   Humiliation, Afraid, Rape, and Kick questionnaire    Fear of Current or Ex-Partner: No    Emotionally Abused: No    Physically Abused: No    Sexually Abused: No   Family History  Problem Relation Age of Onset   Hypertension Mother    Diabetes Mother    Heart disease Mother    Colon cancer Father 46   Diabetes Brother    Pancreatic cancer Paternal Uncle    Liver disease Paternal Uncle    Esophageal cancer Neg Hx    Stomach cancer Neg Hx    BP 120/88   Pulse 84   Wt 106.7 kg (235 lb 3.2 oz)   SpO2 96%   BMI 31.03 kg/m   Wt Readings from Last 3 Encounters:  11/03/22 106.7 kg (235 lb 3.2 oz)  10/15/22 115 kg (253 lb 8.5 oz)  03/01/20 114.9 kg (253 lb 6.4 oz)   PHYSICAL EXAM: General:  NAD. No resp difficulty, walked into clinic with RW HEENT: Normal Neck: Supple. No JVD. Carotids 2+ bilat; no bruits. No lymphadenopathy or thryomegaly appreciated. Cor: PMI nondisplaced. Regular rate & rhythm. No rubs, gallops or murmurs. Lungs: Clear Abdomen: Soft, nontender, nondistended. No hepatosplenomegaly. No bruits or masses. Good bowel sounds. Extremities: No cyanosis, clubbing, rash, edema Neuro: Alert & oriented x 3, cranial nerves grossly intact. Moves all 4 extremities w/o difficulty. Affect pleasant.  ECG (personally reviewed): A sensed, V paced, QRS 130 msec   ReDs: 32%  Device Interrogation (personally reviewed): 97.7% effective VP, no VT, no AT/AF, 1.8 hr/day activity  ASSESSMENT & PLAN: Chronic Systolic Heart Failure - NICM, receiving cardiology care at [facility] - EF, per patient report, 20-25% in 2014 - s/p ICD implant (2014) - LHC (  2015): mild, non obs CAD - s/p CRT-D (2016) - Echo (3/22) EF 45-50%, RV normal, mild AI - Echo (6/24): EF 40-45%, normal RV - With orthostasis, atrial fibrillation & neuropathy, will get cMRI to evaluate for infiltrative disease. Check multiple myeloma panel to rule out AL. -  NYHA IIb, volume stable today, REDs 32% - Continue Lasix 20 mg daily, OK to take extra PRN. - Continue Coreg 3.125 mg bid. - Continue eplerenone 25 mg daily. - Continue Entresto 24/26 mg bid - Continue empagliflozin - Labs today.  Syncope - Felt to be 2/2 to orthostasis and volume depletion - No arrhythmia on device interrogation - No driving x 6 months per [place] law - ? Dysautonomia   AF - No recent AF on device interrogation today. - A-sensed V-paced on ECG today. - Continue Eliquis 5 mg bid. No bleeding issues - CBC today.  CKD IIIa - Baseline SCr 1.3 - Continue SGLT2i - BMET today.  HTN - BP stable - GDMT as above  HLD - Continue rosuvastatin 40 mg daily - LDL 84 (6/24) - Per cardiology   7. DM2 - hgb A1c 6.5 (6/24) - Continue empagliflozin-metformin - Continue GLP1RA  8. AAA - Most recent CT shows 5.3 cm - Follows with CT surgery at [facility]  NYHA IIb GDMT  Diuretic: Lasix 20 mg daily BB: Coreg 3.125 mg bid Ace/ARB/ARNI: Entresto 24/26 mg bid MRA: Eplerenone 25 mg daily SGLT2i: Empagliflozin 12.5/metformin 1000  Referred to HFSW (PCP, Medications, Transportation, ETOH Abuse, Drug Abuse, Insurance, Surveyor, quantity ): No Refer to Pharmacy: No Refer to Home Health: No Refer to Advanced Heart Failure Clinic: No Refer to General Cardiology: No, established with Cardiology at the [facility]  Follow up with General Cardiology at the [facility], as scheduled. If cMRI suggestive of infiltrative disease, recommend referral to AHF clinic.  Prince Rome, FNP-BC 11/03/22

## 2022-11-03 NOTE — Patient Instructions (Addendum)
Labs done today. We will contact you only if your labs are abnormal.  INCREASE Lasix to 1 tablet by mouth daily.  No other medication changes were made. Please continue all current medications as prescribed.  Your physician has requested that you have a cardiac MRI. Cardiac MRI uses a computer to create images of your heart as its beating, producing both still and moving pictures of your heart and major blood vessels. This has to be approved through your insurance company prior to scheduling, once approved we will contact you to schedule an appointment.   Thank you for allowing [place] to provide your heart failure care after your recent hospitalization. Please follow-up with General Cardiology.

## 2022-11-03 NOTE — Progress Notes (Signed)
ReDS Vest / Clip - 11/03/22 1100       ReDS Vest / Clip   Station Marker C    Ruler Value 30    ReDS Value Range Low volume    ReDS Actual Value 32

## 2022-11-17 ENCOUNTER — Encounter: Payer: Self-pay | Admitting: Family Medicine

## 2023-01-08 ENCOUNTER — Telehealth (HOSPITAL_COMMUNITY): Payer: Self-pay | Admitting: *Deleted

## 2023-01-20 ENCOUNTER — Telehealth (HOSPITAL_COMMUNITY): Payer: Self-pay | Admitting: *Deleted

## 2023-01-20 ENCOUNTER — Telehealth (HOSPITAL_COMMUNITY): Payer: Self-pay | Admitting: Cardiology

## 2023-01-20 NOTE — Telephone Encounter (Signed)
Attempted to call patient regarding upcoming cardiac MRI appointment. Left message on voicemail with name and callback number Johney Frame RN Navigator Cardiac Imaging [facility] [phone] Office

## 2023-01-20 NOTE — Telephone Encounter (Signed)
Community care referral faxed  Cardiac mri rescheduled

## 2023-01-21 ENCOUNTER — Other Ambulatory Visit: Payer: Self-pay

## 2023-01-21 ENCOUNTER — Emergency Department (HOSPITAL_COMMUNITY): Payer: No Typology Code available for payment source

## 2023-01-21 ENCOUNTER — Emergency Department (HOSPITAL_COMMUNITY)
Admission: EM | Admit: 2023-01-21 | Discharge: 2023-01-21 | Discharge status: Against Medical Advice | Payer: No Typology Code available for payment source | Attending: Physician Assistant | Admitting: Physician Assistant

## 2023-01-21 ENCOUNTER — Encounter (HOSPITAL_COMMUNITY): Payer: Self-pay

## 2023-01-21 ENCOUNTER — Emergency Department (HOSPITAL_COMMUNITY)
Admission: RE | Admit: 2023-01-21 | Discharge: 2023-01-21 | Disposition: A | Discharge status: Home / Self Care | Payer: No Typology Code available for payment source | Source: Ambulatory Visit | Attending: Cardiology | Admitting: Cardiology

## 2023-01-21 DIAGNOSIS — Z5321 Procedure and treatment not carried out due to patient leaving prior to being seen by health care provider: Secondary | ICD-10-CM | POA: Diagnosis not present

## 2023-01-21 DIAGNOSIS — R0789 Other chest pain: Secondary | ICD-10-CM | POA: Diagnosis present

## 2023-01-21 DIAGNOSIS — I5022 Chronic systolic (congestive) heart failure: Secondary | ICD-10-CM

## 2023-01-21 LAB — COMPREHENSIVE METABOLIC PANEL WITH GFR
ALT: 18 U/L (ref 0–44)
AST: 19 U/L (ref 15–41)
Albumin: 3.8 g/dL (ref 3.5–5.0)
Alkaline Phosphatase: 51 U/L (ref 38–126)
Anion gap: 9 (ref 5–15)
BUN: 11 mg/dL (ref 8–23)
CO2: 26 mmol/L (ref 22–32)
Calcium: 9.8 mg/dL (ref 8.9–10.3)
Chloride: 105 mmol/L (ref 98–111)
Creatinine, Ser: 1.12 mg/dL (ref 0.61–1.24)
GFR, Estimated: >60 mL/min (ref >60)
Glucose, Bld: 107 mg/dL — ABNORMAL HIGH (ref 70–99)
Potassium: 3.5 mmol/L (ref 3.5–5.1)
Sodium: 140 mmol/L (ref 135–145)
Total Bilirubin: 1 mg/dL (ref 0.3–1.2)
Total Protein: 7 g/dL (ref 6.5–8.1)

## 2023-01-21 LAB — CBC WITH DIFFERENTIAL/PLATELET
Abs Immature Granulocytes: 0 10*3/uL (ref 0.00–0.07)
Basophils Absolute: 0 10*3/uL (ref 0.0–0.1)
Basophils Relative: 1 %
Eosinophils Absolute: 0.1 10*3/uL (ref 0.0–0.5)
Eosinophils Relative: 2 %
HCT: 50.2 % (ref 39.0–52.0)
Hemoglobin: 16.3 g/dL (ref 13.0–17.0)
Immature Granulocytes: 0 %
Lymphocytes Relative: 44 %
Lymphs Abs: 1.7 10*3/uL (ref 0.7–4.0)
MCH: 28.1 pg (ref 26.0–34.0)
MCHC: 32.5 g/dL (ref 30.0–36.0)
MCV: 86.6 fL (ref 80.0–100.0)
Monocytes Absolute: 0.3 10*3/uL (ref 0.1–1.0)
Monocytes Relative: 9 %
Neutro Abs: 1.7 10*3/uL (ref 1.7–7.7)
Neutrophils Relative %: 44 %
Platelets: 217 10*3/uL (ref 150–400)
RBC: 5.8 MIL/uL (ref 4.22–5.81)
RDW: 14.9 % (ref 11.5–15.5)
WBC: 3.8 10*3/uL — ABNORMAL LOW (ref 4.0–10.5)
nRBC: 0 % (ref 0.0–0.2)

## 2023-01-21 LAB — TROPONIN I (HIGH SENSITIVITY): Troponin I (High Sensitivity): 8 ng/L (ref <18)

## 2023-01-21 NOTE — ED Triage Notes (Signed)
Pt was at MRI and began having chest pain. Has had a lot of recent stressors and began having chest pain when thinking about it. Chest pain is left sided and and radiates to right chest. Gets better when sitting up. Chest pain is aching and causes him to be SOB. Pt currently having chest pain

## 2023-01-21 NOTE — ED Notes (Signed)
Pt. Stated that his pain resolved and wanted to leave. Tech removed IV and pt. Changed into personal clothing and left. Moved to [employer]

## 2023-01-21 NOTE — ED Provider Triage Note (Signed)
Emergency Medicine Provider Triage Evaluation Note  POLLARD NOU , a 67 y.o. male  was evaluated in triage.  Pt complains of chest discomfort.  Pt was having an Mri today and began having chest pain. Pt having an MRi of chest.  Review of Systems  Positive: Chest discomfort Negative: fever  Physical Exam  BP (!) 149/86 (BP Location: Right Arm)   Pulse 80   Temp 98.1 F (36.7 C) (Oral)   Resp 16   SpO2 100%  Gen:   Awake, no distress   Resp:  Normal effort  MSK:   Moves extremities without difficulty  Other:    Medical Decision Making  Medically screening exam initiated at 10:32 AM.  Appropriate orders placed.  SURAJ JOURNIGAN was informed that the remainder of the evaluation will be completed by another provider, this initial triage assessment does not replace that evaluation, and the importance of remaining in the ED until their evaluation is complete.     Elson Areas, [place] 01/21/23 [phone]

## 2023-08-10 ENCOUNTER — Telehealth (HOSPITAL_COMMUNITY): Payer: Self-pay | Admitting: *Deleted

## 2023-08-10 NOTE — Telephone Encounter (Signed)
 Received notification from Dr. Ty Hilts at [facility] of pt eligibility to participate in cardiac rehab s/p 3/19 APR.  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  Called and spoke to both pt and wife regarding interest in Cardiac rehab once follow up completed and whether [facility] authorization would be needed. Pt and wife very interested and noted on their paperwork the contact information for [facility].  Had not yet been contacted however they plan to contact them today.  Asked if they had a preference between the two. Noted that they live about the same distance between the two locations. Plan to use the [facility] for the financial coverage.  Unsure it the authorization with [facility] for the surgery also covers cardiac rehab.  If this is the case, would prefer to stay with [facility].  If not, would like to go to [place].  Advised we would need an authorization from care in the community by his cardiologist Correy Gosnell for cardiac rehab at [facility].  Verbalized understanding and obtained contact information.

## 2023-11-14 ENCOUNTER — Emergency Department (HOSPITAL_COMMUNITY)

## 2023-11-14 ENCOUNTER — Encounter (HOSPITAL_COMMUNITY): Payer: Self-pay

## 2023-11-14 ENCOUNTER — Emergency Department (HOSPITAL_COMMUNITY)
Admission: EM | Admit: 2023-11-14 | Discharge: 2023-11-15 | Disposition: A | Discharge status: Home / Self Care | Attending: Emergency Medicine | Admitting: Emergency Medicine

## 2023-11-14 ENCOUNTER — Other Ambulatory Visit: Payer: Self-pay

## 2023-11-14 DIAGNOSIS — M25511 Pain in right shoulder: Secondary | ICD-10-CM | POA: Diagnosis not present

## 2023-11-14 DIAGNOSIS — M62838 Other muscle spasm: Secondary | ICD-10-CM | POA: Insufficient documentation

## 2023-11-14 DIAGNOSIS — M79602 Pain in left arm: Secondary | ICD-10-CM | POA: Diagnosis present

## 2023-11-14 DIAGNOSIS — M62830 Muscle spasm of back: Secondary | ICD-10-CM

## 2023-11-14 LAB — CBC WITH DIFFERENTIAL/PLATELET
Abs Immature Granulocytes: 0.01 K/uL (ref 0.00–0.07)
Basophils Absolute: 0 K/uL (ref 0.0–0.1)
Basophils Relative: 1 %
Eosinophils Absolute: 0.1 K/uL (ref 0.0–0.5)
Eosinophils Relative: 2 %
HCT: 44.5 % (ref 39.0–52.0)
Hemoglobin: 14.9 g/dL (ref 13.0–17.0)
Immature Granulocytes: 0 %
Lymphocytes Relative: 45 %
Lymphs Abs: 2.6 K/uL (ref 0.7–4.0)
MCH: 27.6 pg (ref 26.0–34.0)
MCHC: 33.5 g/dL (ref 30.0–36.0)
MCV: 82.6 fL (ref 80.0–100.0)
Monocytes Absolute: 0.5 K/uL (ref 0.1–1.0)
Monocytes Relative: 8 %
Neutro Abs: 2.5 K/uL (ref 1.7–7.7)
Neutrophils Relative %: 44 %
Platelets: 193 K/uL (ref 150–400)
RBC: 5.39 MIL/uL (ref 4.22–5.81)
RDW: 15 % (ref 11.5–15.5)
WBC: 5.7 K/uL (ref 4.0–10.5)
nRBC: 0 % (ref 0.0–0.2)

## 2023-11-14 LAB — BASIC METABOLIC PANEL WITH GFR
Anion gap: 10 (ref 5–15)
BUN: 13 mg/dL (ref 8–23)
CO2: 20 mmol/L — ABNORMAL LOW (ref 22–32)
Calcium: 9.2 mg/dL (ref 8.9–10.3)
Chloride: 106 mmol/L (ref 98–111)
Creatinine, Ser: 1.21 mg/dL (ref 0.61–1.24)
GFR, Estimated: >60 mL/min (ref >60)
Glucose, Bld: 106 mg/dL — ABNORMAL HIGH (ref 70–99)
Potassium: 3.8 mmol/L (ref 3.5–5.1)
Sodium: 136 mmol/L (ref 135–145)

## 2023-11-14 LAB — PROTIME-INR
INR: 1 (ref 0.8–1.2)
Prothrombin Time: 13.9 s (ref 11.4–15.2)

## 2023-11-14 MED ORDER — ACETAMINOPHEN 325 MG PO TABS
650.0000 mg | ORAL_TABLET | Freq: Once | ORAL | Status: AC
  Administered 2023-11-15: 650 mg via ORAL
  Filled 2023-11-14: qty 2

## 2023-11-14 MED ORDER — KETOROLAC TROMETHAMINE 15 MG/ML IJ SOLN
15.0000 mg | Freq: Once | INTRAMUSCULAR | Status: AC
  Administered 2023-11-15: 15 mg via INTRAMUSCULAR
  Filled 2023-11-14: qty 1

## 2023-11-14 MED ORDER — OXYCODONE HCL 5 MG PO TABS
5.0000 mg | ORAL_TABLET | Freq: Once | ORAL | Status: AC
  Administered 2023-11-15: 5 mg via ORAL
  Filled 2023-11-14: qty 1

## 2023-11-14 MED ORDER — DIAZEPAM 5 MG PO TABS
5.0000 mg | ORAL_TABLET | Freq: Once | ORAL | Status: AC
  Administered 2023-11-15: 5 mg via ORAL
  Filled 2023-11-14: qty 1

## 2023-11-14 MED ORDER — OXYCODONE-ACETAMINOPHEN 5-325 MG PO TABS: 1.0000 | ORAL_TABLET | Freq: Once | ORAL | Status: DC

## 2023-11-14 NOTE — ED Notes (Signed)
 Patient transported to X-ray

## 2023-11-14 NOTE — ED Notes (Addendum)
 Aspen C-collar applied in EMS triage.

## 2023-11-14 NOTE — ED Provider Notes (Signed)
 [facility] EMERGENC[facility] DEPARTMENT AT [facility] Provider Note   CSN: [id number] Arrival date & time: 11/14/23  2018     Patient presents with: Arm Pain (left)   Kristopher Scott is a 68 y.o. male.  {Add pertinent medical, surgical, social history, OB history to HPI:32947} 68 yo M with a chief complaint of left-sided neck pain that radiates down the arm.  This been going on for a few days.  The patient had unfortunately fallen while walking down a ramp and landed on his left posterior shoulder.  No significant issues initially but had developed pain and discomfort especially with range of motion.  Pain shooting from the posterior aspect of the shoulder down the arm.  Tingling at times.  Has tried multiple medications he had at home without much improvement.  Went to urgent care today and there was some concern that he needed advanced imaging and was sent here for evaluation.   Arm Pain       Prior to Admission medications   Medication Sig Start Date End Date Taking? Authorizing Provider  acetaminophen  (TYLENOL ) 325 MG tablet Take 2 tablets (650 mg total) by mouth every 6 (six) hours as needed for moderate pain. 10/17/22   Romelle Booty, MD  allopurinol  (ZYLOPRIM ) 300 MG tablet Take 300 mg by mouth daily.    [provider]  Apixaban  (ELIQUIS  PO) Take 5 mg by mouth 2 (two) times daily.     [provider]  carvedilol  (COREG ) 3.125 MG tablet Take 1 tablet (3.125 mg total) by mouth 2 (two) times daily with a meal. 10/17/22   Romelle Booty, MD  clobetasol cream (TEMOVATE) 0.05 % Apply 1 Application topically as needed. Apply to hard itchy spot on the leg 10/29/20   [provider]  clotrimazole  (LOTRIMIN ) 1 % external solution Apply 1 Application topically 2 (two) times daily as needed. Apply small amount to affected fungal toenails 05/26/22   [provider]  cyanocobalamin  (VITAMIN B12) 500 MCG tablet Take 1,000 mcg by mouth daily. 07/14/22    [provider]  Empagliflozin -metFORMIN HCl 12.09-998 MG TABS Take 1 tablet by mouth in the morning and at bedtime. 07/30/21   [provider]  eplerenone (INSPRA) 25 MG tablet Take 25 mg by mouth daily. 11/11/21   [provider]  ferrous sulfate 325 (65 FE) MG tablet Take 325 mg by mouth daily with breakfast.    [provider]  finasteride (PROSCAR) 5 MG tablet Take 5 mg by mouth daily. 06/17/22   [provider]  furosemide  (LASIX ) 20 MG tablet Take 1 tablet (20 mg total) by mouth daily. 11/03/22   [name], [name], FNP  Galcanezumab-gnlm 100 MG/ML SOSY Inject 300 mg into the skin every 30 (thirty) days. Emgality 07/14/22   [provider]  isosorbide  mononitrate (IMDUR ) 60 MG 24 hr tablet Take 60 mg by mouth daily.     [provider]  lidocaine  (LIDODERM ) 5 % Place 1 patch onto the skin daily as needed (for pain). 07/17/20   [provider]  Magnesium Oxide 420 MG TABS Take 420 mg by mouth daily.    [provider]  nitroGLYCERIN  (NITROSTAT ) 0.4 MG SL tablet Place 0.4 mg under the tongue every 5 (five) minutes as needed for chest pain.     [provider]  pantoprazole (PROTONIX) 40 MG tablet Take 40 mg by mouth daily. 07/17/20   [provider]  prazosin  (MINIPRESS ) 5 MG capsule Take 5 mg  by mouth at bedtime. 03/29/19   [provider]  rosuvastatin  (CRESTOR ) 40 MG tablet Take 40 mg by mouth daily. 06/28/20   [provider]  sacubitril -valsartan  (ENTRESTO ) 24-26 MG Take 1 tablet by mouth 2 (two) times daily. 01/07/20   Meng, Hao, PA  Semaglutide,0.25 or 0.5MG /DOS, 2 MG/3ML SOPN Inject 0.5 mg into the skin once a week. Tuesday 03/12/22   [provider]  tamsulosin (FLOMAX) 0.4 MG CAPS capsule Take 0.4 mg by mouth at bedtime. 12/11/21   [provider]    Allergies: Bee venom, Spironolactone, Lisinopril, and Propranolol    Review of Systems  Updated Vital Signs BP  (!) 136/102 (BP Location: Right Arm)   Pulse 76   Temp 97.8 F (36.6 C)   Resp 16   Ht 6' 1 (1.854 m)   Wt 100.7 kg   SpO2 100%   BMI 29.29 kg/m   Physical Exam Vitals and nursing note reviewed.  Constitutional:      Appearance: He is well-developed.  HENT:     Head: Normocephalic and atraumatic.  Eyes:     Pupils: Pupils are equal, round, and reactive to light.  Neck:     Vascular: No JVD.  Cardiovascular:     Rate and Rhythm: Normal rate and regular rhythm.     Heart sounds: No murmur heard.    No friction rub. No gallop.  Pulmonary:     Effort: No respiratory distress.     Breath sounds: No wheezing.  Abdominal:     General: There is no distension.     Tenderness: There is no abdominal tenderness. There is no guarding or rebound.  Musculoskeletal:        General: Tenderness present. Normal range of motion.     Cervical back: Normal range of motion and neck supple.     Comments: Significant pain and spasm to the left trapezius reproduces his discomfort.  Pulse motor and sensation intact distally.  Skin:    Coloration: Skin is not pale.     Findings: No rash.  Neurological:     Mental Status: He is alert and oriented to person, place, and time.  Psychiatric:        Behavior: Behavior normal.     (all labs ordered are listed, but only abnormal results are displayed) Labs Reviewed  BASIC METABOLIC PANEL WITH GFR - Abnormal; Notable for the following components:      Result Value   CO2 20 (*)    Glucose, Bld 106 (*)    All other components within normal limits  CBC WITH DIFFERENTIAL/PLATELET  PROTIME-INR    EKG: None  Radiology: DG Shoulder Left Result Date: 11/14/2023 CLINICAL DATA:  Fall EXAM: LEFT SHOULDER - 2+ VIEW COMPARISON:  None Available. FINDINGS: There is no evidence of fracture or dislocation. There is no evidence of arthropathy or other focal bone abnormality. Soft tissues are unremarkable. IMPRESSION: Negative. Electronically Signed   By: Greig Pique M.D.   On: 11/14/2023 21:51    {Document cardiac monitor, telemetry assessment procedure when appropriate:32947} Procedures   Medications Ordered in the ED  oxyCODONE -acetaminophen  (PERCOCET/ROXICET) 5-325 MG per tablet 1 tablet (has no administration in time range)  acetaminophen  (TYLENOL ) tablet 650 mg (has no administration in time range)  oxyCODONE  (Oxy IR/ROXICODONE ) immediate release tablet 5 mg (has no administration in time range)  ketorolac  (TORADOL ) 15 MG/ML injection 15 mg (has no administration in time range)  diazepam  (VALIUM ) tablet 5 mg (has no administration  in time range)      {Click here for ABCD2, HEART and other calculators REFRESH Note before signing:1}                              Medical Decision Making Risk OTC drugs. Prescription drug management.   68 yo M with a chief complaints of left shoulder pain after a fall few days ago.  I think it is less likely that the patient suffered a cervical fracture without initial discomfort.  Sounds more like muscular spasm as his pain developed over the course of a couple days.  Clinically has trapezius spasm on exam.  As it was traumatic I will wait for the CT imaging of the C-spine.  Plain film of the left shoulder on my independent interpretation without fracture or dislocation.  No anemia, no significant electrolyte abnormalities.  {Document critical care time when appropriate  Document review of labs and clinical decision tools ie CHADS2VASC2, etc  Document your independent review of radiology images and any outside records  Document your discussion with family members, caretakers and with consultants  Document social determinants of health affecting pt's care  Document your decision making why or why not admission, treatments were needed:32947:::1}   Final diagnoses:  None    ED Discharge Orders     None

## 2023-11-14 NOTE — ED Provider Triage Note (Signed)
 Emergency Medicine Provider Triage Evaluation Note  Kristopher Scott , a 68 y.o. male  was evaluated in triage.  Pt complains of left shoulder pain. The patient states that his pain came on earlier today. Endorses pain from his neck down his shoulder to his arm. He did experience a mechanical fall 2 days ago landing on his left side.  Review of Systems  Positive: MSK pain, headache Negative: Chest pain, SOB, numbness  Physical Exam  BP (!) 136/102 (BP Location: Right Arm)   Pulse 76   Temp 97.8 F (36.6 C)   Resp 16   SpO2 100%  Gen:   Awake, in pain, resting comfortably when not disturbed Resp:  Normal effort  MSK:   Moves extremities without difficulty, TTP of the left shoulder, pain with attempts at ROM, positive spurling sign   Medical Decision Making  Medically screening exam initiated at 8:28 PM.  Appropriate orders placed.  Kristopher Scott was informed that the remainder of the evaluation will be completed by another provider, this initial triage assessment does not replace that evaluation, and the importance of remaining in the ED until their evaluation is complete.  Pain medication provided, will obtain CT Cervical Spine and XR of the L shoulder.    Jerrol Agent, MD 11/14/23 2032

## 2023-11-14 NOTE — ED Notes (Signed)
 Patient returned from X-ray

## 2023-11-14 NOTE — ED Notes (Signed)
 EDP to bedside to assess pt, reports ok to go to lobby

## 2023-11-14 NOTE — ED Triage Notes (Signed)
 Pt to ED via [facility] from [facility] c/o left arm pain. Pt had a mechanical fall 2 days ago landing on the left side. Pt has had pain to left arm and left shoulder since injury.   Pt also reports open heart surgery 4 months ago, but currently denies chest pain.   Last VS: 152/98, 76HR. 20 RR, 96%RA  #20LFA, Fentanyl given by EMS.

## 2023-11-15 MED ORDER — DICLOFENAC SODIUM 1 % EX GEL: 4.0000 g | Freq: Four times a day (QID) | CUTANEOUS | 0 refills | Status: AC

## 2023-11-15 MED ORDER — METHYLPREDNISOLONE 4 MG PO TBPK
ORAL_TABLET | ORAL | 0 refills | Status: AC
Start: 2023-11-15 — End: ?

## 2023-11-15 NOTE — Discharge Instructions (Addendum)
 Follow up with your family doc in the office.  You need to perform range of motion exercises at least 4 times a day.   Take the steroids as prescribed.  Use the gel as prescribed.  Also take tylenol  1000mg (2 extra strength) four times a day.

## 2023-11-15 NOTE — Progress Notes (Signed)
 Orthopedic Tech Progress Note Patient Details:  Kristopher Scott 31-Mar-1956 [id number]  Ortho Devices Type of Ortho Device: Arm sling Ortho Device/Splint Location: lue Ortho Device/Splint Interventions: Ordered, Application, Adjustment   Post Interventions Patient Tolerated: Well Instructions Provided: Care of device, Adjustment of device  Chandra Dorn PARAS 11/15/2023, 1:03 AM

## 2024-03-08 NOTE — Discharge Summary (Signed)
 [facility]  Discharge Summary  PCP: Nada Bio, PA Patient Identification:  Kristopher Scott is a 68 y.o. male Date of Birth: 02-24-1956 Admit Date: 03/04/2024 Discharge Date: 03/08/2024  Hospital LOS: 5 Attending Physician: Tinnie DELENA Leff, DO Discharge Diagnosis   Active Hospital Problems   Diagnosis Date Noted POA   *Stroke-like symptom 03/04/2024 Yes   Right pontine stroke (*) 03/08/2024 Yes   Hypertension 03/08/2024 Yes   HFrEF (heart failure with reduced ejection fraction) (*) 03/08/2024 Yes   Type 2 diabetes mellitus (*) 03/08/2024 Yes   Right sided facial pain 03/08/2024 Unknown   Chronic atrial fibrillation (*) 03/08/2024 Yes    Resolved Hospital Problems   Diagnosis Date Noted Date Resolved POA   Acute ischemic stroke (*) 03/04/2024 03/04/2024 Yes    Hospital Course   History of Present Illness: Kristopher Scott is a 68 y.o. male with past medical history of atrial fibrillation (on Eliquis ), HFrEF (s/p ICD), Hypertension, Hyperlipidemia, Obstructive Sleep Apnea, Type 2 Diabetes mellitus, TAVR, structural lumbar disease, and hemiplegic migraine who presented to the emergency department at [facility] on 03/04/2024 with left sided weakness. NIHSS was 12. Head CT revealed no acute intracranial abnormality. The patient did not receive IV TNK (tenecteplase) due to Eliquis . CTA head and neck showed no large vessel occlusion and he was therefore not a candidate for thrombectomy. He was then admitted to the Neurology Service for stroke work up.   MRI Head showed a possible tiny right pontine subacute infarct, but degree of weakness seemed out of proportion to size of stroke, and there was concern for a possible functional overlay. The etiology of stroke was determined to be due to small vessel disease/lacunar. Aspirin 81 mg daily was added to his Eliquis  for secondary stroke prevention. He was  continued on his home Rosuvastatin  40 mg daily. During the hospitalization, he complained of right facial pain as well, and Gabapentin  was added for treatment.   He was evaluated by therapy services, and acute inpatient rehabilitation was recommended. He was discharged to [facility] in stable medical condition on 03/08/2024. The following medical problems were addressed during this hospitalization:  1)  Chief Complaint: Tiny subacute Right pontine stroke 2)  Impaired mobility due to stroke - Presented with left sided weakness, initial NIHSS 12 - Acute Stroke Treatment:  - No TNK due to Eliquis  - Was not a candidate for thrombectomy due to no large vessel occlusion  - Etiology of Stroke: Small Vessel/Lacunar - Stroke Risk Factors: Heart Failure, Hypertension, Hyperlipidemia, Diabetes, Atrial Fibrillation, and Obstructive Sleep Apnea   - Secondary Prevention on Discharge: Antiplatelet Monotherapy with Aspirin 81 mg, Anticoagulation with Eliquis  5 mg twice a day, and High Intensity Statin with Rosuvastatin  40 mg - Initial NIHSS: 12  - Discharge NIHSS: 7  - Baseline Modified Rankin Scale 3 (Moderate disability, requiring some help, but able to walk without assistance) - Discharge Modified Rankin Scale 4 (Moderately severe disability, unable to walk without assistance, and unable to attend bodily needs without assistance)  Diagnostic Workup This Admission:  - Initial CT Head: No acute intracranial abnormality - CT angiogram of head and neck: No large vessel occlusion.   - MRI brain: Questionable tiny right pontine subacute nonhemorrhagic infarct.   - Transthoracic Echocardiogram: LVEF 40-45%. Moderate hypokinesis of the left ventricle. Mild diastolic dysfunction. Injection of contrast documents no interatrial shunt. Left atrium normal in size.   - Hemoglobin A1C: 6.4%  - LDL: 43  Therapeutic: - Antiplatelet therapy: Aspirin 81 mg.  - Anticoagulation therapy: Continue home Eliquis  5 mg  twice a day.  - High-Intensity Statin therapy: Continue home Rosuvastatin  40 mg orally at bedtime.   - Blood Pressure Goal: Less than 130/80 mmHg - PT/OT/Speech assessed and followed the patient during the hospitalization, and recommended acute inpatient rehabilitation.  Long-Term Secondary Stroke Prevention Goals & Recommendations (adapted from [facility] 2021 Guidelines for Stroke Prevention): - Goal blood pressure < 130/80 mmHg  - Goal LDL < 70: For people with diabetes and ASCVD, treatment with high-intensity statin therapy is recommended to target an LDL cholesterol reduction of >=50% from baseline and an LDL cholesterol goal of <55 mg/dL (<8.5 mmol/L). Addition of ezetimibe or a PCSK9 inhibitor with proven benefit in this population is recommended if this goal is not achieved on maximum tolerated statin therapy. Standards of Care in Diabetes (2024) from American Diabetes Association (ADA) - Goal Hemoglobin A1c < 7%, and if a diagnosis of Diabetes, use of glucose lowering agents (GLP-1 receptor agonists, SGLT2 inhibitors, Thiazolidinedione) with proven cardiovascular benefit to reduce future risk of major adverse cardiovascular events (MACE) - Smoking Cessation  - Low-Sodium Mediterranean diet (DASH diet) - Medication Compliance  - Regular Exercise (optimal for 30 minutes per day, five days per week; minimum 10 minutes of moderate-intensity aerobic activity 4x per week or 20 minutes of vigorous-intensity aerobic activity 2x per week) - Sleep health: 7-9 hours of sleep per night for adults 18-64, and 7-8 hours nightly for older adults   3) History of Depression 4) Chronic Insomnia - On home Remeron 7.5 mg QHS - Melatonin 5 mg QHS   5)  Idiopathic Right Facial Pain - Added Gabapentin  100 mg TID for now. Titrate as tolerated.  6)  Essential Hypertension 7)  HFrEF, s/p ICD - The patient was on Eplerenone 25 mg daily and Lasix  20 mg daily prior to admission. - Lasix  held initially to allow for  permissive hypertension, then continued to be be held due to AKI. Recommend resume with improvement of creatinine. - EKG Paced - TTE with EF 40-45%. Last Echo 10/2023 with EF 30-35%, moderate global hypokinesis of LV and LA moderately dilated.   8)  Hyperlipidemia - Total chol was 117, LDL 43, Trig 140, and HDL 46 - He was continued on home Rosuvastatin  40 mg.    9)  Type 2 Diabetes Mellitus  - Hemoglobin A1C was 6.4%.  - He was on Empagliflozin -Metformin HCl 12.09-998 mg twice a day prior to admission, which was held during admission - He was placed on Glucosurveillance protocol during the hospitalization.  - He will be discharged on his home regimen.   10)  Acute Kidney Injury - Creatinine was 1.42 upon admission - Lasix  was held due to AKI. May resume with improvement in Cr - Treated with gentle IVF hydration - Creatinine 1.30 upon discharge  11)  Possible epiglottic mass - CTA head/neck incidentally noted effacement of the right aryepiglottic fold with soft tissue fullness, incompletely evaluated secondary to swallowing motion artifact. An underlying mass is not excluded on this exam. No enlarged lymph nodes. Recommend further clinical/direct inspection and possibly a nonemergent evaluation with a CT neck with contrast.  - Recommend outpatient CT neck with contrast for further evaluation   Consults during Hospitalization: Cardiology for ICD clearance for MRI  Disposition: Acute Inpatient Rehabilitation at [facility]  Follow up: He will need close follow up with a Primary Care Provider and outpatient Neurology after discharge for optimal  and continued vascular risk factor assessment and reduction. Follow up with Stroke [facility] was not arranged at this time as Stroke [facility] follow up with be arranged after discharge from rehabilitation.   Discharge Medications     Current Discharge Medication List     START taking these medications      Details  aspirin 81  mg chewable tablet Start taking on: March 09, 2024  Chew one tablet (81 mg dose) by mouth daily.   calcium  carbonate 500 mg chewable tablet Commonly known as: TUMS  Chew two tablets (1,000 mg dose) by mouth every 6 (six) hours as needed.   gabapentin  100 mg capsule Commonly known as: NEURONTIN   Take one capsule (100 mg dose) by mouth 3 (three) times a day.   melatonin 5 mg  Take one tablet (5 mg dose) by mouth at bedtime.       CONTINUE these medications which have NOT CHANGED      Details  allopurinol  300 mg tablet Commonly known as: ZYLOPRIM   Take one tablet (300 mg dose) by mouth every morning.   ELIQUIS  5 mg tablet Generic drug: apixaban   Take one tablet (5 mg dose) by mouth 2 (two) times daily.   eplerenone 25 mg tablet Commonly known as: INSPRA  Take one tablet (25 mg dose) by mouth daily.   mirtazapine 15 mg tablet Commonly known as: REMERON  Take one half tablet (7.5 mg dose) by mouth at bedtime.   pantoprazole sodium 40 mg tablet Commonly known as: PROTONIX  Take one tablet (40 mg dose) by mouth daily.   PRALUENT 75 MG/ML pen Generic drug: alirocumab  Inject 1 mL (75 mg dose) into the skin every 14 (fourteen) days.   rosuvastatin  calcium  40 mg tablet Commonly known as: CRESTOR   Take one tablet (40 mg dose) by mouth daily.   SYNJARDY 12.09-998 MG Tabs Generic drug: Empagliflozin -metFORMIN HCl  Take 1 tablet by mouth 2 (two) times daily after meals.      * You might also be taking other medications not listed above. If you have questions about any of your other medications, talk to the person who prescribed them or your Primary Care Provider.          STOP taking these medications    furosemide  20 mg tablet Commonly known as: LASIX         Physical Exam  BP 135/90 (BP Location: Left Upper Arm, Patient Position: Sitting)   Pulse 76   Temp 98 F (36.7 C) (Oral)   Resp 18   Ht 1.867 m (6' 1.5)   Wt 104.3 kg (230 lb)   SpO2 100%    BMI 29.93 kg/m   PHQ9       03/07/2024 03/04/2024 09/02/2023 04/03/2021  Depression Screen  1. Little interest or pleasure in doing things 0 - - -  2. Feeling down, depressed, or hopeless 3 - - -  PHQ-2 Total Score 3 - - -  3. Trouble falling or staying asleep 0 - - -  4. Feeling tired or having little energy 0 - - -  5. Poor appetite or overeating 0 - - -  6. Feeling bad about yourself - or that you are a failure or have let yourself or your family down 0 - - -  7. Trouble concentrating on things, such as reading the newspaper or watching television 0 - - -  8. Moving or speaking so slowly that other people could have noticed.  Or  the opposite - being so fidgety or restless that you have been moving around a lot more than usual. 0 - - -  9. Thoughts that you would be better off dead, or of hurting yourself in some way. 0 - - -  PHQ Total Score 3 - - -  Interpretation: Minimal or No Depression - - -  PHQ-9 Interpretation of Score for Depression (Payor) Negative - - -  Wish to be Dead: - No No No *  Suicidal Thoughts: - No No No *  Suicide Behavior Question: - No No No *  C-SSRS Screening Result - No Risk No Risk No Risk *    Details      * Data saved with a previous flowsheet row definition         Discharge NIH Stroke Scale NIH Stroke Scale     Level of Consciousness (1a.): Alert, keenly responsive  LOC Questions (1b.): Answers both questions correctly  LOC Commands (1c.): Performs both tasks correctly  Best Gaze (2.): Normal  Visual (3.): Partial hemianopia  Facial Palsy (4.): Minor paralysis  Motor Arm, Left (5a.): Some effort against gravity  Motor Arm, Right (5b.): No drift  Motor Leg, Left (6a.): Some effort against gravity  Motor Leg, Right (6b.): No drift  Limb Ataxia (7.): Absent  Sensory (8.): Mild-to-moderate sensory loss, patient feels pinprick is less sharp or is dull on the affected side, or there is a loss of superficial pain with pinprick, but patient  is aware of being touched  Best Language (9.): No aphasia  Dysarthria (10.): Normal  Extinction and Inattention (11.) (Formerly Neglect): No abnormality  NIH Total: 7   Physical Exam  General:  Well Developed, Well Nourished, No acute distress Head, Eyes, Ears, Nose, Throat: Normocephalic, atraumatic, PERRL, Conjunctiva normal, Nares normal, Oropharynx moist and clear Neck:  Supple with normal range of motion, No lymphadenopathy  Cardiovascular:  Regular, rate, and rhythm Lungs:  Clear to auscultation with normal effort Abdomen:  Bowel sounds active, soft, non-tender, non-distended Skin:  Warm, dry, intact.  No Focal Rashes  Extremities:  No clubbing, cyanosis, or edema.    Neurologic:  Mental Status: Awake, Alert and Follows commands.  Good insight.  Good informant.   Mood good.  Affect appropriate.  Naming good.  Recent and remote memory intact.  Able to abstract good similarities between like objects.  Speech: Clear, no dysarthria. Orientation to: Person, Place and Time CN II-XII: Normal Pupils: Equal and Reactive EOM: Intact Face: ; slight asymmetric Motor:Normal tone, intact fine finger movements, no tremor.  R slightly stronger than left with drift in LUE and LLE. Question effort. Sensation: Left normal and Right normal Coordination: No Ataxia Present.  Gait: Not tested this visit  Labs   Recent Results (from the past 24 hours)  Basic Metabolic Panel   Collection Time: 03/08/24  2:09 AM  Result Value Ref Range   Na 136 136 - 146 mmol/L   Potassium 3.9 3.7 - 5.4 mmol/L   Cl 103 97 - 108 mmol/L   CO2 20 20 - 32 mmol/L   AGAP 13 7 - 16 mmol/L   Glucose 130 (H) 65 - 99 mg/dL   BUN 15 8 - 27 mg/dL   Creatinine 8.69 (H) 9.23 - 1.27 mg/dL   Ca 9.2 8.6 - 89.7 mg/dL   BUN/CREAT RATIO 88.4 11.0 - 26.0   eGFR 61 >=60 mL/min/1.66m2  CBC   Collection Time: 03/08/24  2:09 AM  Result Value Ref  Range   WBC 4.2 4.0 - 10.5 thou/mcL   RBC 5.75 4.63 - 6.08 million/mcL   HGB 15.3  13.7 - 17.5 gm/dL   HCT 51.8 59.8 - 48.9 %   MCV 83.7 79.0 - 92.2 fL   MCH 26.6 25.7 - 32.2 pg   MCHC 31.8 (L) 32.3 - 36.5 gm/dL   Plt Ct 801 849 - 599 thou/mcL   RDW SD 47.8 (H) 35.1 - 46.3 fL   MPV 9.9 9.4 - 12.4 fL   NRBC% 0.0 0.0 - 0.2 /100WBC   Absolute NRBC Count 0.00 0.00 - 0.01 thou/mcL  Magnesium   Collection Time: 03/08/24  2:09 AM  Result Value Ref Range   Mg 1.8 1.6 - 2.6 mg/dL  If new NPO order, complete POCT Glucose every 4 hours   Collection Time: 03/08/24  8:04 AM  Result Value Ref Range   Glucose, POC 97 70 - 99 mg/dL   OPERATOR ID [id number]    INSTRUMENT ID [id number]   No Correction Scale Coverage:  POCT Glucose 30 minutes before meals and at bedtime   Collection Time: 03/08/24 11:07 AM  Result Value Ref Range   Glucose, POC 188 (H) 70 - 99 mg/dL   OPERATOR ID [id number]    INSTRUMENT ID [id number]   ECG 12 lead   Collection Time: 03/08/24  2:05 PM  Result Value Ref Range   Acquisition Device [id number]    Ventricular Rate 76 BPM   Atrial Rate 76 BPM   P-R Interval 166 ms   QRS Duration 130 ms   Q-T Interval 408 ms   QTC Calculation(Bazett) 459 ms   Calculated P Axis 61 degrees   Calculated R Axis 190 degrees   Calculated T Axis 47 degrees   ECG Diagnosis      Atrial-sensed ventricular-paced rhythm Abnormal ECG When compared with ECG of 04-Mar-2024 16:39, Electronic ventricular pacemaker has replaced Sinus rhythm Autry Sharper ([id number]) on 03/08/2024 5:40:22 PM certifies that he/she has reviewed the ECG tracing and confirms the independent interpretation is correct.   If new NPO order, complete POCT Glucose every 4 hours   Collection Time: 03/08/24  3:47 PM  Result Value Ref Range   Glucose, POC 119 (H) 70 - 99 mg/dL   OPERATOR ID [id number]    INSTRUMENT ID [id number]    Last Resulted Components     Date/Time Component Value Flag Units Reference Range Lab Status   03/04/24 2358 CHOL 117 -- mg/dL 899 - 800 mg/dL Final result   89/68/74 2358 HDL  46 -- mg/dL >=60 mg/dL Final result   89/68/74 2358 LDL 43 -- mg/dL 0 - 99 mg/dL Final result   89/68/74 2358 TRIG 140 -- mg/dL 0 - 850 mg/dL Final result   89/68/74 2358 HGBA1C 6.4* High % 4.8 - 5.6 % Final result   03/08/24 1107 GLUCOSE 188* High mg/dL 70 - 99 mg/dL Final result        Labs on Discharge:  Recent Labs    Units 03/08/24 0209 03/07/24 0852 03/04/24 2358 03/04/24 1627  WBC thou/mcL 4.2 3.8* 4.3 5.3  HGB gm/dL 84.6 84.8 84.9 84.8  HCT % 48.1 47.6 46.5 45.6  PLT thou/mcL 198 199 189 191   Recent Labs    Units 03/08/24 0209 03/07/24 0852 03/04/24 2358 03/04/24 1829 03/04/24 1627  NA mmol/L 136 138 141  --  142  K mmol/L 3.9 4.2 3.8  --  4.1  CL mmol/L 103 104 107  --  108  CO2 mmol/L 20 21 20   --  21  BUN mg/dL 15 13 14   --  11  CREATININE mg/dL 8.69* 8.78 8.71*  --  8.57*  CALCIUM  mg/dL 9.2 9.1 9.1  --  9.4  PHOS mg/dL  --   --  3.5 2.1*  --    Recent Labs    Units 03/04/24 1627  BILITOT mg/dL 0.7  AST U/L 24  ALT U/L 11  ALKPHOS U/L 66  ALBUMIN gm/dL 4.0   Last Resulted Components     Date/Time Component Value Flag Units Reference Range Lab Status   03/04/24 2358 CHOL 117 -- mg/dL 899 - 800 mg/dL Final result   89/68/74 2358 HDL 46 -- mg/dL >=60 mg/dL Final result   89/68/74 2358 LDL 43 -- mg/dL 0 - 99 mg/dL Final result   89/68/74 2358 TRIG 140 -- mg/dL 0 - 850 mg/dL Final result   89/68/74 2358 HGBA1C 6.4* High % 4.8 - 5.6 % Final result   03/08/24 1107 GLUCOSE 188* High mg/dL 70 - 99 mg/dL Final result      Recent Labs    Units 03/04/24 1627  INR  1.1  PTT second(s) 27   No results for input(s): TROPONIN, CK in the last 168 hours.  Invalid input(s): CK-MB Recent Results (from the past 12 weeks)  TSH   Collection Time: 03/04/24  4:27 PM  Result Value Ref Range   TSH 1.16 0.45 - 4.50 mcIU/mL   No results found for this or any previous visit (from the past 12 weeks). No results found for this or any previous visit (from the  past 12 weeks). No results found for this or any previous visit (from the past 12 weeks). No results found for this or any previous visit (from the past 12 weeks).  Images  Echocardiogram Complete W Enhancing Agent Addendum Date: 03/07/2024   Left Ventricle: EF: 40-45%. Quantitative analysis of left ventricular Global Longitudinal Strain (GLS) imaging is -10.800%.   Mitral Valve: There is mild regurgitation.   Left Atrium: Injection of agitated saline documents no interatrial shunt.   Aortic Valve: There is a bioprosthetic valve. The prosthetic valve function is normal, with peak and mean gradients of 13.000 and 7.000 mmHg.   Result Date: 03/07/2024   Left Ventricle: EF: 40-45%. Quantitative analysis of left ventricular Global Longitudinal Strain (GLS) imaging is -10.800%.   Aortic Valve: The aortic valve is bicuspid.   Mitral Valve: There is mild regurgitation.   Left Atrium: Injection of agitated saline documents no interatrial shunt.   MRI Head WO Contrast Result Date: 03/06/2024 COMPARISON: 03/04/2024 TECHNIQUE:  MRI HEAD WO CONTRAST INDICATION: Stroke FINDINGS: MRI BRAIN Motion artifact. Questionable tiny right pontine subacute nonhemorrhagic infarct. No acute intracranial hemorrhage. No midline shift or acute mass effect. No abnormal extra-axial fluid collection.  Ventricles, cisterns, and sulci are unremarkable.  Negative for hydrocephalus.  Mild periventricular and deep white matter patchy FLAIR hyperintensities, statistically small vessel ischemic disease. Major basilar intracranial flow voids are patent. Orbits unremarkable.  Paranasal sinuses are clear.  Mastoid air cells are clear.  No suspicious osseous lesions identified.   IMPRESSION: MRI BRAIN 1.  Questionable tiny right pontine subacute nonhemorrhagic infarct. 2.  Mild small vessel ischemic disease changes. Electronically Signed by: Charlie Patch, MD on 03/06/2024 1:37 AM  XR Chest Ap Portable Result Date: 03/04/2024 XR  CHEST 1 VIEW HISTORY:   Weakness COMPARISON:  09/02/2023  TECHNIQUE:  Chest, 1 view  AP portable  . FINDINGS: Pacer/AICD  is present. Monitoring leads project over the chest. Cardiomediastinal silhouette is normal. Lungs are grossly clear. No pleural effusions. No pneumothorax . No acute osseous abnormality.   IMPRESSION:  1. No definite acute process. Electronically Signed by: Harrie Copes on 03/04/2024 5:36 PM  CT Code Stroke Head Neck Angio Result Date: 03/04/2024 CT CODE STROKE HEAD NECK ANGIO INDICATION: Stroke/TIA signs & symptoms COMPARISON: None Available at Time of Dictation TECHNIQUE: Thin axial scans were performed through the head and neck after bolus injection of intravenous contrast. 3-D MIP images were generated. The degree of cervical ICA stenosis (if any) was determined using NASCET-like measurement criteria: Severe: 70-99%; Moderate: 50-69%; Mild: Less than 50%. CONTRAST: 75 mL cc of IOPAMIDOL  76 % IV SOLN. FINDINGS: CTA head: Intracranial vessels: #  Distal internal carotid arteries: No occlusion or significant stenosis. #  Anterior cerebral arteries: No occlusion or significant stenosis. #  Middle cerebral arteries: No occlusion or significant stenosis. #  Distal vertebral arteries: No occlusion or significant stenosis. #  Basilar artery: No occlusion or significant stenosis. #  Posterior cerebral arteries: No occlusion or significant stenosis. #  Dural sinuses: Patent but not well evaluated secondary to arterial phase of contrast. CTA neck: #  Aortic arch and brachiocephalic vessels: No significant abnormality. #  Right carotid artery: No occlusion or significant stenosis. #  Left carotid artery: No occlusion or significant stenosis. #  Right vertebral: No occlusion or significant stenosis. #  Left vertebral: No occlusion or significant stenosis. #  Additional findings: Effacement of the right aryepiglottic fold with soft tissue fullness, incompletely evaluated secondary to swallowing motion  artifact. An underlying mass is not excluded on this exam. No enlarged lymph nodes. Recommend further clinical/direct inspection and possibly a nonemergent evaluation with a CT neck with contrast.   IMPRESSION: 1.  No occlusion or significant stenosis. 2.  Effacement of the right aryepiglottic fold with soft tissue fullness, incompletely evaluated secondary to swallowing motion artifact. An underlying mass is not excluded on this exam. No enlarged lymph nodes. Recommend further clinical/direct inspection and possibly a nonemergent evaluation with a CT neck with contrast. Electronically Signed by: Valma Companion, MD on 03/04/2024 5:00 PM  CT Code Stroke Head WO Contrast Result Date: 03/04/2024 CT CODE STROKE HEAD WO IV CONTRAST INDICATION: Neurological deficit. COMPARISON: None Available at Time of Dictation TECHNIQUE: Utilizing automatic exposure control, CT scanning was performed through the head without intravenous contrast. FINDINGS: No acute infarct, intracranial hemorrhage, extra-axial fluid collection, mass, or midline shift. No hydrocephalus. Incidental note is made of arteriosclerotic calcifications within the intracranial arteries. Patchy periventricular and deep white matter hypoattenuation is nonspecific but compatible with mild chronic microangiopathic changes. The visualized paranasal sinuses are clear. The mastoid air cells are clear. Fractures, likely old.   IMPRESSION: 1.  No acute intracranial abnormality. Electronically Signed by: Valma Companion, MD on 03/04/2024 4:46 PM    Discharge Instructions  Activity after leaving the hospital:  Activity Instructions     Activity as tolerated     Driving Restrictions     No driving.       Diet:  Diet Instructions     Consistent Carbohydrate Diet (diabetic)         Other Instructions     Discharge instructions     1.  Patient will need follow up with a Primary Care Provider in 1 week or less after discharge from  rehabilitation.  2.  The patient will need to follow up at the Stroke [facility] after  discharge from rehabilitation. This will be arranged by the Stroke Navigator. 3.  Hold Lasix  for now. Resume when Creatinine improves.   Notify Physician and CALL 911 if you experience Chest Pain or discomfort, especially if associated with tiredness, sick on stomach or sweaty feeling.     Notify Physician and Call 911 if you experience any of the following: Sudden numbness or weakness, sudden trouble speaking, vision problems, trouble walking, sudden severe headache with no known cause.          Follow-up appointments: Follow-up with Nada Bio, PA in 7-10 days after discharge from inpatient rehabilitation. 2.   Appointments which have been scheduled    Mar 21, 2024 1:00 PM New Patient with Lin JONELLE Civatte, MD [facility] (--) [address] [facility] [address] [place] [address] [place] [id number]      3.  Follow-up with Stroke [facility] after discharge from inpatient rehabilitation   Recommendations to physicians:  Monitor creatinine levels. If creatinine improves, resume Lasix    Recommended Labs/Studies after Discharge:  Outpatient non urgent CT neck with contrast recommended due to effacement of the right aryepiglottic fold with soft tissue fullness incidentally noted on CTA head/neck. An underlying mass is not excluded on this exam.   Joint Commission Mandatory Stroke Quality:  (If No is selected, please explain why for each quality measure)  DVT Prophylaxis: yes Lipid Panel: ordered High Intensity Statin: yes. Anticoagulation if Atrial Fibrillation: yes Antiplatelet: yes Rehab Needs Addressed: yes   Time spent in discharge process:  50 minutes   Electronically signed: Rexene ONEIDA Brooking, PA-C 03/08/2024 / 5:49 PM    *Some images could not be shown.

## 2024-04-20 ENCOUNTER — Ambulatory Visit: Admitting: Sports Medicine

## 2024-04-20 ENCOUNTER — Encounter: Payer: Self-pay | Admitting: Sports Medicine

## 2024-04-20 VITALS — BP 122/79 | HR 66 | Temp 98.4°F | Ht 73.0 in | Wt 233.8 lb

## 2024-04-20 DIAGNOSIS — Z7984 Long term (current) use of oral hypoglycemic drugs: Secondary | ICD-10-CM | POA: Diagnosis not present

## 2024-04-20 DIAGNOSIS — G4733 Obstructive sleep apnea (adult) (pediatric): Secondary | ICD-10-CM

## 2024-04-20 DIAGNOSIS — I714 Abdominal aortic aneurysm, without rupture, unspecified: Secondary | ICD-10-CM | POA: Insufficient documentation

## 2024-04-20 DIAGNOSIS — B9689 Other specified bacterial agents as the cause of diseases classified elsewhere: Secondary | ICD-10-CM | POA: Insufficient documentation

## 2024-04-20 DIAGNOSIS — Z7729 Contact with and (suspected ) exposure to other hazardous substances: Secondary | ICD-10-CM | POA: Insufficient documentation

## 2024-04-20 DIAGNOSIS — I4891 Unspecified atrial fibrillation: Secondary | ICD-10-CM | POA: Diagnosis not present

## 2024-04-20 DIAGNOSIS — K5900 Constipation, unspecified: Secondary | ICD-10-CM | POA: Diagnosis not present

## 2024-04-20 DIAGNOSIS — E1122 Type 2 diabetes mellitus with diabetic chronic kidney disease: Secondary | ICD-10-CM | POA: Diagnosis not present

## 2024-04-20 DIAGNOSIS — N1831 Chronic kidney disease, stage 3a: Secondary | ICD-10-CM

## 2024-04-20 DIAGNOSIS — I5020 Unspecified systolic (congestive) heart failure: Secondary | ICD-10-CM

## 2024-04-20 DIAGNOSIS — E1140 Type 2 diabetes mellitus with diabetic neuropathy, unspecified: Secondary | ICD-10-CM | POA: Insufficient documentation

## 2024-04-20 DIAGNOSIS — S8012XA Contusion of left lower leg, initial encounter: Secondary | ICD-10-CM | POA: Insufficient documentation

## 2024-04-20 DIAGNOSIS — F322 Major depressive disorder, single episode, severe without psychotic features: Secondary | ICD-10-CM | POA: Diagnosis not present

## 2024-04-20 DIAGNOSIS — N433 Hydrocele, unspecified: Secondary | ICD-10-CM | POA: Insufficient documentation

## 2024-04-20 DIAGNOSIS — I639 Cerebral infarction, unspecified: Secondary | ICD-10-CM | POA: Insufficient documentation

## 2024-04-20 DIAGNOSIS — I358 Other nonrheumatic aortic valve disorders: Secondary | ICD-10-CM | POA: Insufficient documentation

## 2024-04-20 DIAGNOSIS — N529 Male erectile dysfunction, unspecified: Secondary | ICD-10-CM | POA: Insufficient documentation

## 2024-04-20 DIAGNOSIS — E119 Type 2 diabetes mellitus without complications: Secondary | ICD-10-CM

## 2024-04-20 DIAGNOSIS — G5601 Carpal tunnel syndrome, right upper limb: Secondary | ICD-10-CM | POA: Insufficient documentation

## 2024-04-20 DIAGNOSIS — I420 Dilated cardiomyopathy: Secondary | ICD-10-CM | POA: Insufficient documentation

## 2024-04-20 DIAGNOSIS — I2510 Atherosclerotic heart disease of native coronary artery without angina pectoris: Secondary | ICD-10-CM | POA: Insufficient documentation

## 2024-04-20 DIAGNOSIS — Z8673 Personal history of transient ischemic attack (TIA), and cerebral infarction without residual deficits: Secondary | ICD-10-CM

## 2024-04-20 DIAGNOSIS — N401 Enlarged prostate with lower urinary tract symptoms: Secondary | ICD-10-CM | POA: Insufficient documentation

## 2024-04-20 LAB — POCT GLYCOSYLATED HEMOGLOBIN (HGB A1C)
HbA1c POC (<> result, manual entry): 6.5 % (ref 4.0–5.6)
HbA1c, POC (controlled diabetic range): 6.5 % (ref 0.0–7.0)
HbA1c, POC (prediabetic range): 6.5 % — AB (ref 5.7–6.4)
Hemoglobin A1C: 6.5 % — AB (ref 4.0–5.6)

## 2024-04-20 MED ORDER — GABAPENTIN 100 MG PO CAPS: 100.0000 mg | ORAL_CAPSULE | Freq: Two times a day (BID) | ORAL | Status: AC

## 2024-04-20 NOTE — Progress Notes (Signed)
 New Patient Office Visit  Patient ID: Kristopher Scott, Male   DOB: December 26, 1955 68 y.o. MRN: [id number] Subjective:     Discussed the use of AI scribe software for clinical note transcription with the patient, who gave verbal consent to proceed.  History of Present Illness  Kristopher Scott is a 68 year old male with a history of open heart surgery and stroke who presents to establish care.  He is recovering from an open heart surgery performed in March 2025, followed by a stroke on March 04, 2024. He experiences occasional diplopia and difficulty focusing, which he attributes to the stroke. He also has episodes of choking, particularly with liquids, occurring about four times since the stroke. A swallow test was conducted in the hospital, and he has been consuming softer foods and nutrition drinks.  He has a history of congestive heart failure diagnosed in 2015, with a pacemaker initially placed in 2016 and replaced last year. He experiences some dyspnea and requires three pillows to sleep due to orthopnea. He can ambulate for about twenty minutes .He has not seen a cardiologist since his surgery in March 2025.  He is on multiple medications for his heart condition, including Lasix  20 mg daily, Coreg  twice a day, Entresto , and Inspra. He also takes Eliquis  for atrial fibrillation.  His diabetes management is stable with an A1c of 6.5, taking semaglutide, metformin, and Farxiga. He uses a [employer] system for glucose monitoring.  He experiences migraines lasting 3.5 to 5 hours, with the last severe episode occurring on the previous Friday. He takes Aimovig injections every two weeks for migraine prophylaxis.  He experiences occasional dizziness, particularly when standing up at night or from a chair, and has bowel movements about twice a week without using stool softeners.  No smoking, alcohol, or drug use. He has received his flu shot for the current season.  H/o CVA  On  aspirin, eliquis  03/04/24 2358 LDL 43 -- mg/dL 0 - 99 mg/dL Final result   Lowback. Neck pain  Followed with neurosurgery     CT angiography: 03/04/24 1. No occlusion or significant stenosis. 2. Effacement of the right aryepiglottic fold with soft tissue fullness, incompletely evaluated secondary to swallowing motion artifact. An underlying mass is not excluded on this exam. No enlarged lymph nodes. Recommend further clinical/direct inspection and possibly a nonemergent evaluation with a CT neck with contrast.   Impression Left Ventricle: EF: 40-45%. Quantitative analysis of left ventricular Global Longitudinal Strain (GLS) imaging is -10.800%. Mitral Valve: There is mild regurgitation. Left Atrium: Injection of agitated saline documents no interatrial shunt. Aortic Valve: There is a bioprosthetic valve. The prosthetic valve function is normal, with peak and mean gradients of 13.000 and 7.000 mmHg.  EKG: atrial fibrillation   Outpatient Encounter Medications as of 04/20/2024  Medication Sig   aspirin 81 MG chewable tablet Chew 81 mg by mouth.   calcium  carbonate (TUMS - DOSED IN MG ELEMENTAL CALCIUM ) 500 MG chewable tablet Chew 1,000 mg by mouth.   cyclobenzaprine  (FLEXERIL ) 10 MG tablet Take 10 mg by mouth.   gabapentin  (NEURONTIN ) 100 MG capsule Take 100 mg by mouth.   acetaminophen  (TYLENOL ) 325 MG tablet Take 2 tablets (650 mg total) by mouth every 6 (six) hours as needed for moderate pain.   Alirocumab (PRALUENT) 75 MG/ML SOAJ Inject 75 mg into the skin.   allopurinol  (ZYLOPRIM ) 300 MG tablet Take 300 mg by mouth daily.   Apixaban  (ELIQUIS  PO) Take 5 mg by  mouth 2 (two) times daily.    carvedilol  (COREG ) 3.125 MG tablet Take 1 tablet (3.125 mg total) by mouth 2 (two) times daily with a meal.   clobetasol cream (TEMOVATE) 0.05 % Apply 1 Application topically as needed. Apply to hard itchy spot on the leg   clotrimazole  (LOTRIMIN ) 1 % external solution Apply 1 Application topically  2 (two) times daily as needed. Apply small amount to affected fungal toenails   cyanocobalamin  (VITAMIN B12) 500 MCG tablet Take 1,000 mcg by mouth daily.   diclofenac  Sodium (VOLTAREN ) 1 % GEL Apply 4 g topically 4 (four) times daily.   Empagliflozin -metFORMIN HCl 12.09-998 MG TABS Take 1 tablet by mouth in the morning and at bedtime.   eplerenone (INSPRA) 25 MG tablet Take 25 mg by mouth daily.   ferrous sulfate 325 (65 FE) MG tablet Take 325 mg by mouth daily with breakfast.   finasteride (PROSCAR) 5 MG tablet Take 5 mg by mouth daily.   furosemide  (LASIX ) 20 MG tablet Take 1 tablet (20 mg total) by mouth daily.   Galcanezumab-gnlm 100 MG/ML SOSY Inject 300 mg into the skin every 30 (thirty) days. Emgality   isosorbide  mononitrate (IMDUR ) 60 MG 24 hr tablet Take 60 mg by mouth daily.    lidocaine  (LIDODERM ) 5 % Place 1 patch onto the skin daily as needed (for pain).   Magnesium Oxide 420 MG TABS Take 420 mg by mouth daily.   methylPREDNISolone  (MEDROL  DOSEPAK) 4 MG TBPK tablet Day 1: 8mg  before breakfast, 4 mg after lunch, 4 mg after supper, and 8 mg at bedtime Day 2: 4 mg before breakfast, 4 mg after lunch, 4 mg  after supper, and 8 mg  at bedtime Day 3:  4 mg  before breakfast, 4 mg  after lunch, 4 mg after supper, and 4 mg  at bedtime Day 4: 4 mg  before breakfast, 4 mg  after lunch, and 4 mg at bedtime Day 5: 4 mg  before breakfast and 4 mg at bedtime Day 6: 4 mg  before breakfast   mirtazapine (REMERON) 15 MG tablet Take 7.5 mg by mouth.   nitroGLYCERIN  (NITROSTAT ) 0.4 MG SL tablet Place 0.4 mg under the tongue every 5 (five) minutes as needed for chest pain.    pantoprazole (PROTONIX) 40 MG tablet Take 40 mg by mouth daily.   prazosin  (MINIPRESS ) 5 MG capsule Take 5 mg by mouth at bedtime.   rosuvastatin  (CRESTOR ) 40 MG tablet Take 40 mg by mouth daily.   sacubitril -valsartan  (ENTRESTO ) 24-26 MG Take 1 tablet by mouth 2 (two) times daily.   Semaglutide,0.25 or 0.5MG /DOS, 2 MG/3ML SOPN  Inject 0.5 mg into the skin once a week. Tuesday   tamsulosin (FLOMAX) 0.4 MG CAPS capsule Take 0.4 mg by mouth at bedtime.   No facility-administered encounter medications on file as of 04/20/2024.    Past Medical History:  Diagnosis Date   Allergy    Anxiety    Arthritis    Atrial fibrillation (HCC)    Back pain    ESI/TENS/therapy   Cardiac defibrillator in place 12/2014   Cluster headaches    Congestive heart failure (CHF) (HCC)    Depression    Diabetes mellitus without complication (HCC)    GERD (gastroesophageal reflux disease)    Hyperlipidemia    Hypertension    Oxygen deficiency    Pinched vertebral nerve    pain/burning from left thigh to left foot at times   Sleep apnea  Stroke [facility])     Past Surgical History:  Procedure Laterality Date   CARDIAC DEFIBRILLATOR PLACEMENT     CARDIAC VALVE REPLACEMENT     HYDROCELE EXCISION / REPAIR     KNEE SURGERY Left    ROTATOR CUFF REPAIR Bilateral     Family History  Problem Relation Age of Onset   Hypertension Mother    Diabetes Mother    Heart disease Mother    Colon cancer Father 43   Diabetes Brother    Pancreatic cancer Paternal Uncle    Liver disease Paternal Uncle    Asthma Sister    Early death Brother    Esophageal cancer Neg Hx    Stomach cancer Neg Hx     Social History   Socioeconomic History   Marital status: Married    Spouse name: Sherrilyn   Number of children: 0   Years of education: Not on file   Highest education level: Some college, no degree  Occupational History   Occupation: Not working,  Tobacco Use   Smoking status: Never   Smokeless tobacco: Never  Vaping Use   Vaping status: Never Used  Substance and Sexual Activity   Alcohol use: Yes    Alcohol/week: 2.0 standard drinks of alcohol    Types: 2 Cans of beer per week    Comment: Only socially.   Drug use: Never   Sexual activity: Not Currently    Birth control/protection: Abstinence  Other Topics Concern   Not on file   Social History Narrative   Not on file   Social Drivers of Health   Tobacco Use: Low Risk (04/20/2024)   Received from [facility]   Patient History    Smoking Tobacco Use: Never    Smokeless Tobacco Use: Never    Passive Exposure: Not on file  Financial Resource Strain: Medium Risk (04/19/2024)   Overall Financial Resource Strain (CARDIA)    Difficulty of Paying Living Expenses: Somewhat hard  Food Insecurity: No Food Insecurity (04/19/2024)   Epic    Worried About Radiation Protection Practitioner of Food in the Last Year: Never true    Ran Out of Food in the Last Year: Never true  Transportation Needs: No Transportation Needs (04/19/2024)   Epic    Lack of Transportation (Medical): No    Lack of Transportation (Non-Medical): No  Physical Activity: Insufficiently Active (04/19/2024)   Exercise Vital Sign    Days of Exercise per Week: 4 days    Minutes of Exercise per Session: 20 min  Stress: Stress Concern Present (04/19/2024)   [employer] of Occupational Health - Occupational Stress Questionnaire    Feeling of Stress: Very much  Social Connections: Moderately Integrated (04/19/2024)   Social Connection and Isolation Panel    Frequency of Communication with Friends and Family: More than three times a week    Frequency of Social Gatherings with Friends and Family: Three times a week    Attends Religious Services: More than 4 times per year    Active Member of Clubs or Organizations: No    Attends Banker Meetings: Not on file    Marital Status: Married  Recent Concern: Social Connections - Somewhat Isolated (03/21/2024)   Received from [facility]   Social Network    How would you rate your social network (family, work, friends)?: Restricted participation with some degree of social isolation  Intimate Partner Violence: Not At Risk (03/21/2024)   Received from [facility]   HITS  Over the last 12 months how often did your partner scream or curse at you?: Never     Over the last 12 months how often did your partner threaten you with physical harm?: Never    Over the last 12 months how often did your partner insult you or talk down to you?: Patient declined    Over the last 12 months how often did your partner physically hurt you?: Patient declined  Depression (EYV7-0): Not on file  Alcohol Screen: Low Risk (04/19/2024)   Alcohol Screen    Last Alcohol Screening Score (AUDIT): 1  Housing: Unknown (04/19/2024)   Epic    Unable to Pay for Housing in the Last Year: Patient declined    Number of Times Moved in the Last Year: 0    Homeless in the Last Year: No  Utilities: Not At Risk (03/21/2024)   Received from [facility]    In the past 12 months has the electric, gas, oil, or water company threatened to shut off services in your home?: No  Health Literacy: Not on file    Review of Systems  Constitutional:  Negative for fever.  HENT:  Negative for congestion.   Eyes:  Negative for blurred vision.  Respiratory:  Positive for shortness of breath (chronic, no recent change). Negative for cough.   Cardiovascular:  Negative for chest pain, palpitations and leg swelling.  Gastrointestinal:  Positive for constipation and heartburn.  Genitourinary:  Negative for dysuria.  Musculoskeletal:  Positive for back pain. Negative for falls.  Psychiatric/Behavioral:  Negative for depression.      Objective:    Ht 6' 1 (1.854 m)   Wt 233 lb 12.8 oz (106.1 kg)   BMI 30.85 kg/m   Physical Exam Constitutional:      Appearance: Normal appearance.  HENT:     Head: Normocephalic and atraumatic.  Cardiovascular:     Rate and Rhythm: Normal rate and regular rhythm.     Pulses: Normal pulses.     Heart sounds: Normal heart sounds.  Pulmonary:     Effort: No respiratory distress.     Breath sounds: No stridor. No wheezing or rales.  Abdominal:     General: Bowel sounds are normal. There is no distension.     Palpations: Abdomen is soft.      Tenderness: There is no abdominal tenderness. There is no guarding.  Musculoskeletal:        General: No swelling.  Neurological:     Mental Status: He is alert. Mental status is at baseline.     Motor: No weakness.     Comments: Eomi  No nystagmus Finger nose slightly abnormal left side     Last CBC Lab Results  Component Value Date   WBC 5.7 11/14/2023   HGB 14.9 11/14/2023   HCT 44.5 11/14/2023   MCV 82.6 11/14/2023   MCH 27.6 11/14/2023   RDW 15.0 11/14/2023   PLT 193 11/14/2023   Last metabolic panel Lab Results  Component Value Date   GLUCOSE 106 (H) 11/14/2023   NA 136 11/14/2023   K 3.8 11/14/2023   CL 106 11/14/2023   CO2 20 (L) 11/14/2023   BUN 13 11/14/2023   CREATININE 1.21 11/14/2023   GFRNONAA >60 11/14/2023   CALCIUM  9.2 11/14/2023   PROT 7.0 01/21/2023   ALBUMIN 3.8 01/21/2023   BILITOT 1.0 01/21/2023   ALKPHOS 51 01/21/2023   AST 19 01/21/2023   ALT 18 01/21/2023  ANIONGAP 10 11/14/2023   Last lipids Lab Results  Component Value Date   CHOL 168 10/17/2022   HDL 56 10/17/2022   LDLCALC 84 10/17/2022   TRIG 142 10/17/2022   CHOLHDL 3.0 10/17/2022   Last hemoglobin A1c Lab Results  Component Value Date   HGBA1C 6.5 (H) 10/16/2022   Last thyroid  functions Lab Results  Component Value Date   TSH 1.017 10/17/2022   Last vitamin D No results found for: 25OHVITD2, 25OHVITD3, VD25OH Last vitamin B12 and Folate Lab Results  Component Value Date   VITAMINB12 413 10/17/2022       Assessment & Plan:   Assessment & Plan Type 2 diabetes mellitus without complication, without long-term current use of insulin  (HCC) A1c 6.5  Cont with empagliflozin  - metformin  Monitor BG  Exercise regularly Orders:   POCT glycosylated hemoglobin (Hb A1C)  HFrEF (heart failure with reduced ejection fraction) (HCC) Lungs clear  No lower extremity swelling  Cont with entresto , lasix , coreg , aspirin, praluent Follow up with cardiology     Atrial fibrillation, unspecified type (HCC) Rate controlled Cont with eliquis , coreg     Cerebrovascular accident (CVA), unspecified mechanism (HCC) Cont with aspirin, praluent,  Follow up with neurology    OSA (obstructive sleep apnea) Cont with cpap    Stage 3a chronic kidney disease (HCC) Avoid nephrotoxic meds     Moderately severe major depression (HCC) stable     Constipation  Take colace, miralax prn    No follow-ups on file.   Jackalyn Blazing, MD [facility]

## 2024-04-20 NOTE — Assessment & Plan Note (Deleted)
 SABRA

## 2024-04-20 NOTE — Assessment & Plan Note (Addendum)
 stable     Constipation  Take colace, miralax prn

## 2024-04-20 NOTE — Patient Instructions (Signed)

## 2024-04-20 NOTE — Assessment & Plan Note (Addendum)
 A1c 6.5  Cont with empagliflozin  - metformin  Monitor BG  Exercise regularly Orders:   POCT glycosylated hemoglobin (Hb A1C)

## 2024-04-20 NOTE — Assessment & Plan Note (Addendum)
 Lungs clear  No lower extremity swelling  Cont with entresto , lasix , coreg , aspirin, praluent Follow up with cardiology

## 2024-06-01 ENCOUNTER — Ambulatory Visit: Admitting: Sports Medicine

## 2024-06-06 ENCOUNTER — Telehealth: Admitting: Sports Medicine

## 2024-06-06 VITALS — BP 117/79 | HR 74 | Wt 231.2 lb

## 2024-06-06 DIAGNOSIS — R051 Acute cough: Secondary | ICD-10-CM

## 2024-06-06 DIAGNOSIS — F322 Major depressive disorder, single episode, severe without psychotic features: Secondary | ICD-10-CM

## 2024-06-06 DIAGNOSIS — I480 Paroxysmal atrial fibrillation: Secondary | ICD-10-CM

## 2024-06-06 DIAGNOSIS — R1310 Dysphagia, unspecified: Secondary | ICD-10-CM

## 2024-06-06 DIAGNOSIS — N1831 Chronic kidney disease, stage 3a: Secondary | ICD-10-CM | POA: Insufficient documentation

## 2024-06-06 DIAGNOSIS — E1140 Type 2 diabetes mellitus with diabetic neuropathy, unspecified: Secondary | ICD-10-CM

## 2024-06-06 DIAGNOSIS — I5022 Chronic systolic (congestive) heart failure: Secondary | ICD-10-CM

## 2024-06-06 DIAGNOSIS — I5020 Unspecified systolic (congestive) heart failure: Secondary | ICD-10-CM

## 2024-06-06 MED ORDER — NITROGLYCERIN 0.4 MG SL SUBL: 0.4000 mg | SUBLINGUAL_TABLET | SUBLINGUAL | 0 refills | Status: AC | PRN

## 2024-06-06 MED ORDER — BENZONATATE 200 MG PO CAPS: 200.0000 mg | ORAL_CAPSULE | Freq: Two times a day (BID) | ORAL | 0 refills | Status: AC | PRN

## 2024-06-06 NOTE — Assessment & Plan Note (Signed)
 SABRA

## 2024-06-06 NOTE — Assessment & Plan Note (Signed)
 Kristopher Scott

## 2024-06-07 ENCOUNTER — Encounter: Payer: Self-pay | Admitting: Sports Medicine

## 2024-06-07 NOTE — Assessment & Plan Note (Signed)
" °    Latest Ref Rng & Units 11/14/2023    8:53 PM 01/21/2023   10:37 AM 11/03/2022   11:54 AM  BMP  Glucose 70 - 99 mg/dL 893  892  891   BUN 8 - 23 mg/dL 13  11  12    Creatinine 0.61 - 1.24 mg/dL 8.78  8.87  8.82   Sodium 135 - 145 mmol/L 136  140  139   Potassium 3.5 - 5.1 mmol/L 3.8  3.5  3.9   Chloride 98 - 111 mmol/L 106  105  105   CO2 22 - 32 mmol/L 20  26  27    Calcium  8.9 - 10.3 mg/dL 9.2  9.8  9.5    Avoid nephrotoxic meds    "

## 2024-08-08 ENCOUNTER — Ambulatory Visit: Admitting: Sports Medicine
